# Patient Record
Sex: Female | Born: 1995 | Race: White | Hispanic: No | Marital: Single | Smoking: Never smoker
Health system: Southern US, Community
[De-identification: ages and names within clinical notes are randomized; demographics above are authoritative.]

## PROBLEM LIST (undated history)

## (undated) ENCOUNTER — Inpatient Hospital Stay (HOSPITAL_COMMUNITY): Payer: Self-pay

## (undated) DIAGNOSIS — A749 Chlamydial infection, unspecified: Secondary | ICD-10-CM

## (undated) DIAGNOSIS — R51 Headache: Secondary | ICD-10-CM

## (undated) DIAGNOSIS — F419 Anxiety disorder, unspecified: Secondary | ICD-10-CM

## (undated) DIAGNOSIS — R519 Headache, unspecified: Secondary | ICD-10-CM

## (undated) DIAGNOSIS — F32A Depression, unspecified: Secondary | ICD-10-CM

## (undated) DIAGNOSIS — D681 Hereditary factor XI deficiency: Secondary | ICD-10-CM

## (undated) DIAGNOSIS — F319 Bipolar disorder, unspecified: Secondary | ICD-10-CM

## (undated) DIAGNOSIS — F329 Major depressive disorder, single episode, unspecified: Secondary | ICD-10-CM

## (undated) HISTORY — PX: NO PAST SURGERIES: SHX2092

---

## 2014-09-18 ENCOUNTER — Inpatient Hospital Stay (HOSPITAL_COMMUNITY)
Admission: AD | Admit: 2014-09-18 | Discharge: 2014-09-22 | DRG: 885 | Disposition: A | Discharge status: Home / Self Care | Payer: BLUE CROSS/BLUE SHIELD | Source: Intra-hospital | Attending: Psychiatry | Admitting: Psychiatry

## 2014-09-18 ENCOUNTER — Encounter (HOSPITAL_COMMUNITY): Payer: Self-pay | Admitting: *Deleted

## 2014-09-18 ENCOUNTER — Encounter (HOSPITAL_COMMUNITY): Payer: Self-pay | Admitting: Emergency Medicine

## 2014-09-18 ENCOUNTER — Emergency Department (HOSPITAL_COMMUNITY)
Admission: EM | Admit: 2014-09-18 | Discharge: 2014-09-18 | Disposition: A | Discharge status: Other Acute Inpatient Hospital | Payer: BLUE CROSS/BLUE SHIELD | Attending: Emergency Medicine | Admitting: Emergency Medicine

## 2014-09-18 DIAGNOSIS — Z3202 Encounter for pregnancy test, result negative: Secondary | ICD-10-CM | POA: Diagnosis not present

## 2014-09-18 DIAGNOSIS — G4700 Insomnia, unspecified: Secondary | ICD-10-CM | POA: Diagnosis present

## 2014-09-18 DIAGNOSIS — T1491 Suicide attempt: Secondary | ICD-10-CM | POA: Diagnosis not present

## 2014-09-18 DIAGNOSIS — R443 Hallucinations, unspecified: Secondary | ICD-10-CM | POA: Diagnosis present

## 2014-09-18 DIAGNOSIS — F333 Major depressive disorder, recurrent, severe with psychotic symptoms: Secondary | ICD-10-CM | POA: Diagnosis present

## 2014-09-18 DIAGNOSIS — R45851 Suicidal ideations: Secondary | ICD-10-CM | POA: Diagnosis present

## 2014-09-18 DIAGNOSIS — F419 Anxiety disorder, unspecified: Secondary | ICD-10-CM | POA: Diagnosis present

## 2014-09-18 DIAGNOSIS — F4310 Post-traumatic stress disorder, unspecified: Secondary | ICD-10-CM | POA: Diagnosis present

## 2014-09-18 HISTORY — DX: Major depressive disorder, recurrent, severe with psychotic symptoms: F33.3

## 2014-09-18 LAB — CBC WITH DIFFERENTIAL/PLATELET
Basophils Absolute: 0 10*3/uL (ref 0.0–0.1)
Basophils Relative: 0 % (ref 0–1)
EOS ABS: 0.1 10*3/uL (ref 0.0–0.7)
Eosinophils Relative: 1 % (ref 0–5)
HCT: 38.3 % (ref 36.0–46.0)
Hemoglobin: 13.8 g/dL (ref 12.0–15.0)
LYMPHS ABS: 1.9 10*3/uL (ref 0.7–4.0)
LYMPHS PCT: 22 % (ref 12–46)
MCH: 31.7 pg (ref 26.0–34.0)
MCHC: 36 g/dL (ref 30.0–36.0)
MCV: 88 fL (ref 78.0–100.0)
Monocytes Absolute: 0.4 10*3/uL (ref 0.1–1.0)
Monocytes Relative: 5 % (ref 3–12)
Neutro Abs: 6.2 10*3/uL (ref 1.7–7.7)
Neutrophils Relative %: 72 % (ref 43–77)
PLATELETS: 214 10*3/uL (ref 150–400)
RBC: 4.35 MIL/uL (ref 3.87–5.11)
RDW: 11.7 % (ref 11.5–15.5)
WBC: 8.7 10*3/uL (ref 4.0–10.5)

## 2014-09-18 LAB — ACETAMINOPHEN LEVEL

## 2014-09-18 LAB — COMPREHENSIVE METABOLIC PANEL
ALBUMIN: 4.7 g/dL (ref 3.5–5.0)
ALK PHOS: 66 U/L (ref 38–126)
ALT: 20 U/L (ref 14–54)
AST: 19 U/L (ref 15–41)
Anion gap: 8 (ref 5–15)
BUN: 9 mg/dL (ref 6–20)
CHLORIDE: 108 mmol/L (ref 101–111)
CO2: 23 mmol/L (ref 22–32)
Calcium: 9 mg/dL (ref 8.9–10.3)
Creatinine, Ser: 0.6 mg/dL (ref 0.44–1.00)
GFR calc Af Amer: >60 mL/min (ref >60)
GFR calc non Af Amer: >60 mL/min (ref >60)
Glucose, Bld: 92 mg/dL (ref 65–99)
POTASSIUM: 3.3 mmol/L — AB (ref 3.5–5.1)
Sodium: 139 mmol/L (ref 135–145)
Total Bilirubin: 0.6 mg/dL (ref 0.3–1.2)
Total Protein: 7.8 g/dL (ref 6.5–8.1)

## 2014-09-18 LAB — SALICYLATE LEVEL

## 2014-09-18 LAB — RAPID URINE DRUG SCREEN, HOSP PERFORMED
AMPHETAMINES: NOT DETECTED
Barbiturates: NOT DETECTED
Benzodiazepines: NOT DETECTED
COCAINE: NOT DETECTED
Opiates: NOT DETECTED
TETRAHYDROCANNABINOL: NOT DETECTED

## 2014-09-18 LAB — URINALYSIS, ROUTINE W REFLEX MICROSCOPIC
BILIRUBIN URINE: NEGATIVE
Glucose, UA: NEGATIVE mg/dL
Hgb urine dipstick: NEGATIVE
KETONES UR: NEGATIVE mg/dL
LEUKOCYTES UA: NEGATIVE
Nitrite: NEGATIVE
PROTEIN: NEGATIVE mg/dL
Specific Gravity, Urine: 1.017 (ref 1.005–1.030)
Urobilinogen, UA: 0.2 mg/dL (ref 0.0–1.0)
pH: 6 (ref 5.0–8.0)

## 2014-09-18 LAB — I-STAT BETA HCG BLOOD, ED (MC, WL, AP ONLY)

## 2014-09-18 LAB — ETHANOL: Alcohol, Ethyl (B): 127 mg/dL — ABNORMAL HIGH (ref <5)

## 2014-09-18 MED ORDER — IBUPROFEN 600 MG PO TABS: 600.0000 mg | ORAL_TABLET | Freq: Three times a day (TID) | ORAL | Status: DC | PRN

## 2014-09-18 MED ORDER — ACETAMINOPHEN 325 MG PO TABS: 650.0000 mg | ORAL_TABLET | ORAL | Status: DC | PRN

## 2014-09-18 MED ORDER — ALUM & MAG HYDROXIDE-SIMETH 200-200-20 MG/5ML PO SUSP: 30.0000 mL | ORAL | Status: DC | PRN

## 2014-09-18 MED ORDER — ONDANSETRON HCL 4 MG PO TABS: 4.0000 mg | ORAL_TABLET | Freq: Three times a day (TID) | ORAL | Status: DC | PRN

## 2014-09-18 MED ORDER — IBUPROFEN 200 MG PO TABS: 600.0000 mg | ORAL_TABLET | Freq: Three times a day (TID) | ORAL | Status: DC | PRN

## 2014-09-18 MED ORDER — MAGNESIUM HYDROXIDE 400 MG/5ML PO SUSP: 30.0000 mL | Freq: Every day | ORAL | Status: DC | PRN

## 2014-09-18 MED ORDER — ACETAMINOPHEN 325 MG PO TABS: 650.0000 mg | ORAL_TABLET | Freq: Four times a day (QID) | ORAL | Status: DC | PRN

## 2014-09-18 MED ORDER — LORAZEPAM 1 MG PO TABS: 1.0000 mg | ORAL_TABLET | Freq: Three times a day (TID) | ORAL | Status: DC | PRN

## 2014-09-18 NOTE — Progress Notes (Signed)
Patient accepted to [facility] Bed 501-2 per Randall Hiss, [place]   IVC paperwork to be faxed to TTS

## 2014-09-18 NOTE — ED Notes (Signed)
[employer] and sitter remain at bedside. Pt sitting on bed guarding self.

## 2014-09-18 NOTE — Consult Note (Signed)
[facility] Face-to-Face Psychiatry Consult   Reason for Consult:  Major depressive disorder, recurrent, severe with Psychotic features Referring Physician:  EDP Patient Identification: Alice Castillo MRN:  [id number] Principal Diagnosis: Major depressive disorder, recurrent, severe with psychotic features Diagnosis:   Patient Active Problem List   Diagnosis Date Noted  . Major depressive disorder, recurrent, severe with psychotic features [F33.3] 09/18/2014    Priority: High    Total Time spent with patient: 1 hour  Subjective:   Alice Castillo is a 19 y.o. female patient admitted with  Major depressive disorder, recurrent, severe with Psychotic features  HPI:  Caucasian female was evaluated for suicide attempt by attempting to jump over a bridge.   Patient has one leg across the bridge when [employer] caught her.  Today patient is still endorsing suicide and repeated stated she want to die.  Patient states that her dead girl friend is haunting her and coming to her and telling her she is the reason why she died.  Patient was tearful during the interview, felt hopeless and helpless.  Patient want to go home and complete her suicide attempt.  She has been accepted for admission and she has a bed assigned.  HPI Elements:   Location:  MDD, Recurrent with Psychosis, suicide attempt.. Quality:  severe. Severity:  severe. Timing:  acute. Duration:  Two years. Context:  Brougght in by [employer] from a Bridge where patient was trying to jump off..  Past Medical History: History reviewed. No pertinent past medical history. History reviewed. No pertinent past surgical history. Family History: No family history on file. Social History:  History  Alcohol Use  . Yes     History  Drug Use No    History   Social History  . Marital Status: Single    Spouse Name: N/A  . Number of Children: N/A  . Years of Education: N/A   Social History Main Topics  . Smoking status: Never Smoker   . Smokeless tobacco: Not  on file  . Alcohol Use: Yes  . Drug Use: No  . Sexual Activity: Not on file   Other Topics Concern  . None   Social History Narrative  . None   Additional Social History:    History of alcohol / drug use?: Yes Name of Substance 1: Alcohol  1 - Age of First Use: unknown  1 - Amount (size/oz): 2-3 shots  1 - Frequency: unknown  1 - Duration: ongoing  1 - Last Use / Amount: 09-17-14 BAL-127                   Allergies:   Allergies  Allergen Reactions  . Food     Bananas-throat swells/shortness of breath    Labs:  Results for orders placed or performed during the hospital encounter of 09/18/14 (from the past 48 hour(s))  Urinalysis, Routine w reflex microscopic (not at [facility])     Status: None   Collection Time: 09/18/14  2:32 AM  Result Value Ref Range   Color, Urine YELLOW YELLOW   APPearance CLEAR CLEAR   Specific Gravity, Urine 1.017 1.005 - 1.030   pH 6.0 5.0 - 8.0   Glucose, UA NEGATIVE NEGATIVE mg/dL   Hgb urine dipstick NEGATIVE NEGATIVE   Bilirubin Urine NEGATIVE NEGATIVE   Ketones, ur NEGATIVE NEGATIVE mg/dL   Protein, ur NEGATIVE NEGATIVE mg/dL   Urobilinogen, UA 0.2 0.0 - 1.0 mg/dL   Nitrite NEGATIVE NEGATIVE   Leukocytes, UA NEGATIVE NEGATIVE  Comment: MICROSCOPIC NOT DONE ON URINES WITH NEGATIVE PROTEIN, BLOOD, LEUKOCYTES, NITRITE, OR GLUCOSE <1000 mg/dL.  Urine rapid drug screen (hosp performed)     Status: None   Collection Time: 09/18/14  2:32 AM  Result Value Ref Range   Opiates NONE DETECTED NONE DETECTED   Cocaine NONE DETECTED NONE DETECTED   Benzodiazepines NONE DETECTED NONE DETECTED   Amphetamines NONE DETECTED NONE DETECTED   Tetrahydrocannabinol NONE DETECTED NONE DETECTED   Barbiturates NONE DETECTED NONE DETECTED    Comment:        DRUG SCREEN FOR MEDICAL PURPOSES ONLY.  IF CONFIRMATION IS NEEDED FOR ANY PURPOSE, NOTIFY LAB WITHIN 5 DAYS.        LOWEST DETECTABLE LIMITS FOR URINE DRUG SCREEN Drug Class       Cutoff  (ng/mL) Amphetamine      1000 Barbiturate      200 Benzodiazepine   497 Tricyclics       026 Opiates          300 Cocaine          300 THC              50   Comprehensive metabolic panel     Status: Abnormal   Collection Time: 09/18/14  2:33 AM  Result Value Ref Range   Sodium 139 135 - 145 mmol/L   Potassium 3.3 (L) 3.5 - 5.1 mmol/L   Chloride 108 101 - 111 mmol/L   CO2 23 22 - 32 mmol/L   Glucose, Bld 92 65 - 99 mg/dL   BUN 9 6 - 20 mg/dL   Creatinine, Ser 0.60 0.44 - 1.00 mg/dL   Calcium 9.0 8.9 - 10.3 mg/dL   Total Protein 7.8 6.5 - 8.1 g/dL   Albumin 4.7 3.5 - 5.0 g/dL   AST 19 15 - 41 U/L   ALT 20 14 - 54 U/L   Alkaline Phosphatase 66 38 - 126 U/L   Total Bilirubin 0.6 0.3 - 1.2 mg/dL   GFR calc non Af Amer >60 >60 mL/min   GFR calc Af Amer >60 >60 mL/min    Comment: (NOTE) The eGFR has been calculated using the CKD EPI equation. This calculation has not been validated in all clinical situations. eGFR's persistently <60 mL/min signify possible Chronic Kidney Disease.    Anion gap 8 5 - 15  CBC with Differential/Platelet     Status: None   Collection Time: 09/18/14  2:33 AM  Result Value Ref Range   WBC 8.7 4.0 - 10.5 K/uL   RBC 4.35 3.87 - 5.11 MIL/uL   Hemoglobin 13.8 12.0 - 15.0 g/dL   HCT 38.3 36.0 - 46.0 %   MCV 88.0 78.0 - 100.0 fL   MCH 31.7 26.0 - 34.0 pg   MCHC 36.0 30.0 - 36.0 g/dL   RDW 11.7 11.5 - 15.5 %   Platelets 214 150 - 400 K/uL   Neutrophils Relative % 72 43 - 77 %   Neutro Abs 6.2 1.7 - 7.7 K/uL   Lymphocytes Relative 22 12 - 46 %   Lymphs Abs 1.9 0.7 - 4.0 K/uL   Monocytes Relative 5 3 - 12 %   Monocytes Absolute 0.4 0.1 - 1.0 K/uL   Eosinophils Relative 1 0 - 5 %   Eosinophils Absolute 0.1 0.0 - 0.7 K/uL   Basophils Relative 0 0 - 1 %   Basophils Absolute 0.0 0.0 - 0.1 K/uL  Acetaminophen level     Status:  Abnormal   Collection Time: 09/18/14  2:33 AM  Result Value Ref Range   Acetaminophen (Tylenol), Serum <10 (L) 10 - 30 ug/mL     Comment:        THERAPEUTIC CONCENTRATIONS VARY SIGNIFICANTLY. A RANGE OF 10-30 ug/mL MAY BE AN EFFECTIVE CONCENTRATION FOR MANY PATIENTS. HOWEVER, SOME ARE BEST TREATED AT CONCENTRATIONS OUTSIDE THIS RANGE. ACETAMINOPHEN CONCENTRATIONS >150 ug/mL AT 4 HOURS AFTER INGESTION AND >50 ug/mL AT 12 HOURS AFTER INGESTION ARE OFTEN ASSOCIATED WITH TOXIC REACTIONS.   Ethanol     Status: Abnormal   Collection Time: 09/18/14  2:33 AM  Result Value Ref Range   Alcohol, Ethyl (B) 127 (H) <5 mg/dL    Comment:        LOWEST DETECTABLE LIMIT FOR SERUM ALCOHOL IS 5 mg/dL FOR MEDICAL PURPOSES ONLY   Salicylate level     Status: None   Collection Time: 09/18/14  2:33 AM  Result Value Ref Range   Salicylate Lvl <5.1 2.8 - 30.0 mg/dL  I-Stat Beta hCG blood, ED ([facility], [facility], [facility] only)     Status: None   Collection Time: 09/18/14  2:37 AM  Result Value Ref Range   I-stat hCG, quantitative <5.0 <5 mIU/mL   Comment 3            Comment:   GEST. AGE      CONC.  (mIU/mL)   <=1 WEEK        5 - 50     2 WEEKS       50 - 500     3 WEEKS       100 - 10,000     4 WEEKS     1,000 - 30,000        FEMALE AND NON-PREGNANT FEMALE:     LESS THAN 5 mIU/mL     Vitals: Blood pressure 99/62, pulse 69, temperature 98.8 F (37.1 C), temperature source Oral, resp. rate 18, height 5' 6" (1.676 m), weight 54.432 kg (120 lb), last menstrual period 09/04/2014, SpO2 99 %.  Risk to Self: Suicidal Ideation: Yes-Currently Present Suicidal Intent: Yes-Currently Present Is patient at risk for suicide?: Yes Suicidal Plan?: No Specify Current Suicidal Plan: Pt was found with one leg over an overpass.  Access to Means: Yes Specify Access to Suicidal Means: Pt had access to an overpass. What has been your use of drugs/alcohol within the last 12 months?: Alcohol use reported How many times?: 0 Other Self Harm Risks: No other self harm risk identified at this time.  Triggers for Past Attempts: None known Intentional Self  Injurious Behavior: None Risk to Others: Homicidal Ideation: No Thoughts of Harm to Others: No Current Homicidal Intent: No Current Homicidal Plan: No Access to Homicidal Means: No Describe Access to Homicidal Means: N/A Identified Victim: N/A History of harm to others?: No Assessment of Violence: None Noted Violent Behavior Description: No violent behaviors observed at this time.  Does patient have access to weapons?: No Criminal Charges Pending?: No Does patient have a court date: No Prior Inpatient Therapy: Prior Inpatient Therapy: No Prior Therapy Dates: N/A Prior Therapy Facilty/Provider(s): N/A Reason for Treatment: N/A Prior Outpatient Therapy: Prior Outpatient Therapy: No Prior Therapy Dates: N/A Prior Therapy Facilty/Provider(s): N/A Reason for Treatment: N/A Does patient have an ACCT team?: No Does patient have Intensive In-House Services?  : No Does patient have [facility] services? : No Does patient have P4CC services?: No  Current Facility-Administered Medications  Medication Dose Route Frequency Provider  Last Rate Last Dose  . acetaminophen (TYLENOL) tablet 650 mg  650 mg Oral Q4H PRN Ripley Fraise, MD      . ibuprofen (ADVIL,MOTRIN) tablet 600 mg  600 mg Oral Q8H PRN Ripley Fraise, MD      . LORazepam (ATIVAN) tablet 1 mg  1 mg Oral Q8H PRN Ripley Fraise, MD      . ondansetron [facility]) tablet 4 mg  4 mg Oral Q8H PRN Ripley Fraise, MD       Current Outpatient Prescriptions  Medication Sig Dispense Refill  . POTASSIUM PO Take 1 tablet by mouth every other day as needed (for potassium due to banana allergy.).      Musculoskeletal: Strength & Muscle Tone: within normal limits Gait & Station: normal Patient leans: N/A  Psychiatric Specialty Exam: Physical Exam  Review of Systems  Constitutional: Negative.   HENT: Negative.   Eyes: Negative.   Respiratory: Negative.   Cardiovascular: Negative.   Gastrointestinal: Negative.   Genitourinary: Negative.    Musculoskeletal: Negative.   Skin: Negative.   Neurological: Negative.   Endo/Heme/Allergies: Negative.     Blood pressure 99/62, pulse 69, temperature 98.8 F (37.1 C), temperature source Oral, resp. rate 18, height 5' 6" (1.676 m), weight 54.432 kg (120 lb), last menstrual period 09/04/2014, SpO2 99 %.Body mass index is 19.38 kg/(m^2).  General Appearance: Casual  Eye Contact::  Good  Speech:  Clear and Coherent and Normal Rate  Volume:  Normal  Mood:  Angry, Anxious, Depressed, Hopeless and helpless  Affect:  Congruent, Depressed and Flat  Thought Process:  Coherent, Goal Directed and Intact  Orientation:  Full (Time, Place, and Person)  Thought Content:  WDL  Suicidal Thoughts:  Yes.  with intent/plan  Homicidal Thoughts:  No  Memory:  Immediate;   Good Recent;   Good Remote;   Good  Judgement:  Poor  Insight:  Shallow  Psychomotor Activity:  Psychomotor Retardation  Concentration:  Good  Recall:  NA  Fund of Knowledge:Fair  Language: Good  Akathisia:  NA  Handed:  Right  AIMS (if indicated):     Assets:  Desire for Improvement  ADL's:  Intact  Cognition: WNL  Sleep:      Medical Decision Making: Review of Psycho-Social Stressors (1)  Treatment Plan Summary: Daily contact with patient to assess and evaluate symptoms and progress in treatment and Medication management  Plan:  Recommend psychiatric Inpatient admission when medically cleared. resume home medications.   Disposition: Admitted to [facility] and has a bed assigned.  Delfin Gant   PMHNP-BC 09/18/2014 4:00 PM  Patient seen face-to-face for psychiatric evaluation along with psychiatric nurse practitioner and case discussed with the treatment team. Presented with symptoms of severe depression, hopelessness, helplessness with suicidal ideation, intention or plan. Patient plan was stopped by [facility]. Formulated treatment plan and reviewed the information documented and agree with the  treatment plan.  ,JANARDHAHA R. 09/19/2014 7:23 PM

## 2014-09-18 NOTE — ED Provider Notes (Signed)
CSN: [id number]     Arrival date & time 09/18/14  0206 History   This chart was scribed for Ripley Fraise, MD by Forrestine Him, ED Scribe. This patient was seen in room WA13/WA13 and the patient's care was started 2:43 AM.   Chief Complaint  Patient presents with  . Hallucinations   The history is provided by the patient. No language interpreter was used.    HPI Comments: Dalyla Chui is a 19 y.o. female without any pertinent past medical history who presents to the Emergency Department here for auditory and visual hallucinations this morning. Pt was transported from overpass prior to arrival by [employer]. Per note, [employer] states pt had one leg over the edge when [employer] removed her. Pt states her friend who passed away when she was 85 years old has been haunting her. States she returned this evening and told her either to kill herself or she was going to kill her. Pt states she was pulled out of bed by her dead friend 2 nights ago. States she does not want to pass out or go to sleep in fear of seeing her friend again. Ms. Tullo denies any illicit drug use this evening. However, she admits to alcohol consumption today. She denies any pain at this time. No chest pain, back pain, neck pain, or abdominal pain. No nausea, vomiting, or diarrhea. No known allergies to medications.  PMH - none  History  Substance Use Topics  . Smoking status: Never Smoker   . Smokeless tobacco: Not on file  . Alcohol Use: Yes   OB History    No data available     Review of Systems  Constitutional: Negative for fever and chills.  Respiratory: Negative for shortness of breath.   Cardiovascular: Negative for chest pain.  Gastrointestinal: Negative for nausea, vomiting and abdominal pain.  Musculoskeletal: Negative for back pain and neck pain.  Psychiatric/Behavioral: Positive for suicidal ideas and hallucinations.  All other systems reviewed and are negative.     Allergies  Review of patient's allergies indicates  no known allergies.  Home Medications   Prior to Admission medications   Not on File   Triage Vitals: BP 133/83 mmHg  Pulse 113  Temp(Src) 98.6 F (37 C) (Oral)  Resp 16  Ht 5\' 6"  (1.676 m)  Wt 120 lb (54.432 kg)  BMI 19.38 kg/m2  SpO2 98%  LMP 09/04/2014   Physical Exam  CONSTITUTIONAL: Well developed/well nourished. Tearful and anxious  HEAD: Normocephalic/atraumatic EYES: EOMI/PERRL ENMT: Mucous membranes moist NECK: supple no meningeal signs SPINE/BACK:entire spine nontender CV: S1/S2 noted, no murmurs/rubs/gallops noted LUNGS: Lungs are clear to auscultation bilaterally, no apparent distress ABDOMEN: soft, nontender GU:no cva tenderness NEURO: Pt is awake/alert/appropriate, moves all extremitiesx4.  No facial droop.   EXTREMITIES: pulses normal/equal, full ROM SKIN: warm, color normal PSYCH: Anxious and tearful    ED Course  Procedures   DIAGNOSTIC STUDIES: Oxygen Saturation is 98% on RA, Normal by my interpretation.    COORDINATION OF CARE: 2:52 AM- Will order CMP, CBC, urinalysis, Acetaminophen level, urine rapid drug screen, salicylate level, and i-stat bata hcg blood. Discussed treatment plan with pt at bedside and pt agreed to plan.      Suspect new onset psychosis Pt is medically stable Labs reassuring IVC completed  Labs Review Labs Reviewed  COMPREHENSIVE METABOLIC PANEL - Abnormal; Notable for the following:    Potassium 3.3 (*)    All other components within normal limits  ACETAMINOPHEN LEVEL - Abnormal; Notable  for the following:    Acetaminophen (Tylenol), Serum <10 (*)    All other components within normal limits  ETHANOL - Abnormal; Notable for the following:    Alcohol, Ethyl (B) 127 (*)    All other components within normal limits  CBC WITH DIFFERENTIAL/PLATELET  URINALYSIS, ROUTINE W REFLEX MICROSCOPIC (NOT AT [facility])  URINE RAPID DRUG SCREEN, HOSP PERFORMED  SALICYLATE LEVEL  I-STAT BETA HCG BLOOD, ED ([facility], [facility], [facility] ONLY)     MDM    Final diagnoses:  Hallucinations    Nursing notes including past medical history and social history reviewed and considered in documentation Labs/vital reviewed myself and considered during evaluation    I personally performed the services described in this documentation, which was scribed in my presence. The recorded information has been reviewed and is accurate.      Ripley Fraise, MD 09/18/14 [phone]

## 2014-09-18 NOTE — ED Notes (Signed)
Pt has been wanded by security.

## 2014-09-18 NOTE — Progress Notes (Addendum)
Pt stated."I just want to go home.I do not need to be here." Pt appears flat, blunted and depressed. She does contract for safety.Pt remains a 1:1 and remains safe. Her affect is very apatheitc and flat. She speaks in a low soft voice. Pt stated,"just let me go home already."Pt stated her good friend was cutting three years ago and the pt threw all the sharps away so her friend could not cut anymore. Pt stated she felt her friend was okay so she went home. That night the friend hung herself. Pt stated she is always haunted at night by this friend who blames her for dying. Pt is very flat and blunted. She only ate grapes and cucumbers for lunch.3p-Pts mom came to visit. 4p-Pts mom is very upset statiing her daughter blames her for the SI attempt. Mom was very tearful speaking to the Probation officer. Pts brother is at the bedside. Pt stated,"You can send me across the street but I am not going to groups and not talking to anybody." "It is a waste of time just let me go home."5p-Phoned to give report for pt going to room 501-1-waiting for the nurse to phone back. Pt stated her mom kicked her out 2 years ago and then accused her of sleeping around. Pt stated,"my mom has no clue that I was helping my friend that killed herself and was not sleeping around."Pt received a phone call from a friend and presently she is talking on the phone. Pt admitted that working a [employer] and exercising still is not enough to get rig of her haunting friend's voice. 6;15pReport given to [facility] in [facility]. MOM's phone number [phone])

## 2014-09-18 NOTE — BH Assessment (Signed)
Assessment completed. Consulted Arlester Marker, NP who agrees that pt meets inpatient criteria. Dr. Christy Gentles has been informed of the recommendation. TTS will contact other facilities for placement.

## 2014-09-18 NOTE — ED Notes (Signed)
Bed: VH29 Expected date:  Expected time:  Means of arrival:  Comments: 69

## 2014-09-18 NOTE — BH Assessment (Addendum)
Tele Assessment Note   Alice Castillo is an 19 y.o. female Presenting to [facility] after a suicide attempted. Pt stated "I attempted suicide which you already know you just want me to tell you anyway". "I don't talk to therapist". Pt did not report any previous suicide attempts or psychiatric hospitalizations; however IVC paperwork reports that pt has attempted suicide in the past by slitting her wrist. Pt stated "I am not crazy multiple times during the assessment. When pt was asked about self-injurious behaviors pt stated "I don't want to talk about it". Pt is endorsing multiple depressive symptoms and shared that her sleep and appetite have been poor. Pt reported having visual hallucinations and stated "I see her". "My dead friend". Pt did not report any pending criminal charges or upcoming court dates. Pt denied having access to weapons or firearms. Pt reported that she drinks alcohol and her BAL is 127. Pt did not report any illicit substance use. When asked about past and present abuse history pt stated "I don't want to answer". Pt did not identify any supportive family members at this time and stated "no one supports me". Inpatient treatment is recommended for psychiatric stabilization.   Axis I: Major Depressive Disorder, Single episode, with psychotic features   Past Medical History: History reviewed. No pertinent past medical history.  History reviewed. No pertinent past surgical history.  Family History: No family history on file.  Social History:  reports that she has never smoked. She does not have any smokeless tobacco history on file. She reports that she drinks alcohol. She reports that she does not use illicit drugs.  Additional Social History:  Alcohol / Drug Use History of alcohol / drug use?: Yes Substance #1 Name of Substance 1: Alcohol  1 - Age of First Use: unknown  1 - Amount (size/oz): 2-3 shots  1 - Frequency: unknown  1 - Duration: ongoing  1 - Last Use / Amount: 09-17-14  BAL-127  CIWA: CIWA-Ar BP: 133/83 mmHg Pulse Rate: 113 COWS:    PATIENT STRENGTHS: (choose at least two) Average or above average intelligence Physical Health  Allergies: No Known Allergies  Home Medications:  (Not in a hospital admission)  OB/GYN Status:  Patient's last menstrual period was 09/04/2014.  General Assessment Data Location of Assessment: [facility] ED TTS Assessment: In system Is this a Tele or Face-to-Face Assessment?: Face-to-Face Is this an Initial Assessment or a Re-assessment for this encounter?: Initial Assessment Can pt return to current living arrangement?: Yes Admission Status: Involuntary Is patient capable of signing voluntary admission?: Yes Referral Source: Other ([employer]) Insurance type: None      Crisis Care Plan Name of Psychiatrist: No provider reported at this time Name of Therapist: No provider reported at this time.   Education Status Is patient currently in school?: No Current Grade: N/A Highest grade of school patient has completed: N/A Name of school: N/A Contact person: N/A  Risk to self with the past 6 months Suicidal Ideation: Yes-Currently Present Has patient been a risk to self within the past 6 months prior to admission? : No Suicidal Intent: Yes-Currently Present Has patient had any suicidal intent within the past 6 months prior to admission? : No Is patient at risk for suicide?: Yes Suicidal Plan?: No Has patient had any suicidal plan within the past 6 months prior to admission? : Yes Specify Current Suicidal Plan: Pt was found with one leg over an overpass.  Access to Means: Yes Specify Access to Suicidal Means: Pt had  access to an overpass. What has been your use of drugs/alcohol within the last 12 months?: Alcohol use reported Previous Attempts/Gestures: No How many times?: 0 Other Self Harm Risks: No other self harm risk identified at this time.  Triggers for Past Attempts: None known Intentional Self Injurious Behavior:  None Family Suicide History: Unknown Recent stressful life event(s):  (Pt did not provide a response. ) Persecutory voices/beliefs?: Yes Depression: Yes Depression Symptoms: Despondent, Insomnia, Tearfulness, Isolating, Feeling worthless/self pity, Feeling angry/irritable Substance abuse history and/or treatment for substance abuse?: Yes Suicide prevention information given to non-admitted patients: Not applicable  Risk to Others within the past 6 months Homicidal Ideation: No Does patient have any lifetime risk of violence toward others beyond the six months prior to admission? : No Thoughts of Harm to Others: No Current Homicidal Intent: No Current Homicidal Plan: No Access to Homicidal Means: No Describe Access to Homicidal Means: N/A Identified Victim: N/A History of harm to others?: No Assessment of Violence: None Noted Violent Behavior Description: No violent behaviors observed at this time.  Does patient have access to weapons?: No Criminal Charges Pending?: No Does patient have a court date: No Is patient on probation?: Unknown  Psychosis Hallucinations: Visual ("Her". "My dead friend". ) Delusions: None noted  Mental Status Report Appearance/Hygiene: In scrubs Eye Contact: Poor Motor Activity: Freedom of movement Speech: Logical/coherent Level of Consciousness: Irritable Mood: Irritable Affect: Irritable Anxiety Level: Moderate Thought Processes: Coherent, Relevant Judgement: Partial (BAL-127) Orientation: Appropriate for developmental age Obsessive Compulsive Thoughts/Behaviors: Minimal  Cognitive Functioning Concentration: Fair Memory: Recent Intact IQ: Average Insight: Poor Impulse Control: Poor Appetite: Poor Weight Loss: 0 Weight Gain: 0 Sleep: Decreased Total Hours of Sleep:  ("I don't know") Vegetative Symptoms: None  ADLScreening [facility] Assessment Services) Patient's cognitive ability adequate to safely complete daily activities?: Yes Patient  able to express need for assistance with ADLs?: Yes Independently performs ADLs?: Yes (appropriate for developmental age)  Prior Inpatient Therapy Prior Inpatient Therapy: No Prior Therapy Dates: N/A Prior Therapy Facilty/Provider(s): N/A Reason for Treatment: N/A  Prior Outpatient Therapy Prior Outpatient Therapy: No Prior Therapy Dates: N/A Prior Therapy Facilty/Provider(s): N/A Reason for Treatment: N/A Does patient have an ACCT team?: No Does patient have Intensive In-House Services?  : No Does patient have [facility] services? : No Does patient have P4CC services?: No  ADL Screening (condition at time of admission) Patient's cognitive ability adequate to safely complete daily activities?: Yes Patient able to express need for assistance with ADLs?: Yes Independently performs ADLs?: Yes (appropriate for developmental age)       Abuse/Neglect Assessment (Assessment to be complete while patient is alone) Physical Abuse:  ("I don't want to answer") Verbal Abuse:  ("I don't want to answer") Sexual Abuse:  ("I don't want to answer") Exploitation of patient/patient's resources:  (Pt did not provide a response. ) Self-Neglect:  (Pt did not provide a response. )     Advance Directives (For Healthcare) Does patient have an advance directive?: No Would patient like information on creating an advanced directive?: No - patient declined information    Additional Information 1:1 In Past 12 Months?: Yes CIRT Risk: No Elopement Risk: No Does patient have medical clearance?: Yes     Disposition:  Disposition Initial Assessment Completed for this Encounter: Yes Disposition of Patient: Inpatient treatment program Type of inpatient treatment program: Adult  Avondre Richens S 09/18/2014 3:36 AM
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
## 2014-09-19 ENCOUNTER — Encounter (HOSPITAL_COMMUNITY): Payer: Self-pay | Admitting: Registered Nurse

## 2014-09-19 DIAGNOSIS — F333 Major depressive disorder, recurrent, severe with psychotic symptoms: Principal | ICD-10-CM

## 2014-09-19 DIAGNOSIS — T1491 Suicide attempt: Secondary | ICD-10-CM

## 2014-09-19 DIAGNOSIS — R45851 Suicidal ideations: Secondary | ICD-10-CM

## 2014-09-19 MED ORDER — DIPHENHYDRAMINE HCL 25 MG PO CAPS: 25.0000 mg | ORAL_CAPSULE | Freq: Four times a day (QID) | ORAL | Status: DC | PRN

## 2014-09-19 MED ORDER — TRAZODONE HCL 50 MG PO TABS: 50.0000 mg | ORAL_TABLET | Freq: Every day | ORAL | Status: DC

## 2014-09-19 MED ORDER — TRAZODONE HCL 50 MG PO TABS
50.0000 mg | ORAL_TABLET | Freq: Every day | ORAL | Status: DC
  Filled 2014-09-19 (×4): qty 1
  Filled 2014-09-19: qty 3

## 2014-09-19 MED ORDER — FLUOXETINE HCL 10 MG PO CAPS
10.0000 mg | ORAL_CAPSULE | Freq: Every day | ORAL | Status: DC
  Administered 2014-09-20 – 2014-09-22 (×3): 10 mg via ORAL
  Filled 2014-09-19 (×5): qty 1
  Filled 2014-09-19: qty 3

## 2014-09-19 MED ORDER — HYDROCORTISONE 1 % EX CREA
TOPICAL_CREAM | Freq: Four times a day (QID) | CUTANEOUS | Status: DC | PRN
  Filled 2014-09-19: qty 28

## 2014-09-19 NOTE — Progress Notes (Signed)
Patient ID: Alice Castillo, female   DOB: 1995-11-03, 19 y.o.   MRN: [id number]   D: Pt has been very flat and depressed on the unit today. Pt has also been very isolative. Pt did not attend any groups, nor did she engage in any treatment. Pt reported that she was not going to do anything while here at [facility], and that all she wants is to go home. Pt was seen by the doctor today and started on Prozac, pt refused medication and reported that she was not going to take anything that was going to cause her to be a zombe. Pt reported being negative SI/HI, no AH/VH noted. A: 15 min checks continued for patient safety. R: Pt safety maintained.
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
## 2014-09-19 NOTE — [facility] Suicide Risk Assessment (Signed)
[facility] Admission Suicide Risk Assessment   Nursing information obtained from:  Patient Demographic factors:  Caucasian, Unemployed Current Mental Status:  Suicidal ideation indicated by patient Loss Factors:  Loss of significant relationship, Financial problems / change in socioeconomic status Historical Factors:  NA Risk Reduction Factors:  Living with another person, especially a relative Total Time spent with patient: 30 minutes Principal Problem: Major depressive disorder, recurrent episode, severe, with psychosis Diagnosis:   Patient Active Problem List   Diagnosis Date Noted  . Major depressive disorder, recurrent episode, severe, with psychosis [F33.3] 09/18/2014    Priority: High  . Major depressive disorder, recurrent, severe with psychotic features [F33.3] 09/18/2014  . Hallucinations [R44.3]   . Severe recurrent major depressive disorder with psychotic features [F33.3]      Continued Clinical Symptoms:  Alcohol Use Disorder Identification Test Final Score (AUDIT): 1 The "Alcohol Use Disorders Identification Test", Guidelines for Use in Primary Care, Second Edition.  World Pharmacologist [facility]). Score between 0-7:  no or low risk or alcohol related problems. Score between 8-15:  moderate risk of alcohol related problems. Score between 16-19:  high risk of alcohol related problems. Score 20 or above:  warrants further diagnostic evaluation for alcohol dependence and treatment.   CLINICAL FACTORS:   Depression:   Comorbid alcohol abuse/dependence Hopelessness Impulsivity Insomnia Severe Alcohol/Substance Abuse/Dependencies   Musculoskeletal: Strength & Muscle Tone: within normal limits Gait & Station: normal Patient leans: N/A  Psychiatric Specialty Exam: Physical Exam  Psychiatric: Her mood appears anxious. Her speech is delayed. She is slowed, withdrawn and actively hallucinating. Cognition and memory are normal. She expresses impulsivity. She expresses  suicidal ideation. She expresses suicidal plans.    Review of Systems  Constitutional: Negative.   HENT: Negative.   Eyes: Negative.   Respiratory: Negative.   Cardiovascular: Negative.   Gastrointestinal: Positive for nausea.  Genitourinary: Negative.   Musculoskeletal: Negative.   Skin: Negative.   Neurological: Negative.   Endo/Heme/Allergies: Negative.   Psychiatric/Behavioral: Positive for depression, suicidal ideas and substance abuse. The patient is nervous/anxious and has insomnia.     Blood pressure 115/78, pulse 93, temperature 98.6 F (37 C), temperature source Oral, resp. rate 18, height 5\' 6"  (1.676 m), weight 54.432 kg (120 lb), last menstrual period 09/04/2014.Body mass index is 19.38 kg/(m^2).  General Appearance: Casual  Eye Contact::  Minimal  Speech:  Clear and Coherent  Volume:  Decreased  Mood:  Depressed, Dysphoric and Hopeless  Affect:  Constricted  Thought Process:  Goal Directed and Linear  Orientation:  Full (Time, Place, and Person)  Thought Content:  Hallucinations: Auditory  Suicidal Thoughts:  Yes.  with intent/plan  Homicidal Thoughts:  No  Memory:  Immediate;   Good Recent;   Good Remote;   Good  Judgement:  Impaired  Insight:  Lacking  Psychomotor Activity:  Decreased  Concentration:  Fair  Recall:  Good  Fund of Knowledge:Good  Language: Good  Akathisia:  No  Handed:  Right  AIMS (if indicated):     Assets:  Communication Skills Desire for Improvement Physical Health  Sleep:  Number of Hours: 4.75  Cognition: WNL  ADL's:  Intact     COGNITIVE FEATURES THAT CONTRIBUTE TO RISK:  Closed-mindedness and Polarized thinking    SUICIDE RISK:   Moderate:  Frequent suicidal ideation with limited intensity, and duration, some specificity in terms of plans, no associated intent, good self-control, limited dysphoria/symptomatology, some risk factors present, and identifiable protective factors, including available and accessible social  support.  PLAN OF CARE: 1. Admit for crisis management and stabilization. 2. Medication management to reduce current symptoms to base line and improve the patient's overall level of functioning 3. Treat health problems as indicated. 4. Develop treatment plan to decrease risk of relapse upon discharge and the need for readmission. 5. Psycho-social education regarding relapse prevention and self care. 6. Health care follow up as needed for medical problems. 7. Restart home medications where appropriate.   Medical Decision Making:  Review or order clinical lab tests (1), Established Problem, Worsening (2), Review of Medication Regimen & Side Effects (2) and Review of New Medication or Change in Dosage (2)  I certify that inpatient services furnished can reasonably be expected to improve the patient's condition.   Corena Pilgrim, MD 09/19/2014, 11:32 AM

## 2014-09-19 NOTE — Progress Notes (Addendum)
Patient ID: Alice Castillo, female   DOB: 02/06/1996, 19 y.o.   MRN: [id number] Client is 19 yo female admitted involuntarily. Client reports "I tried to commit suicide" Client currently contracting for safety. "just went to a bridge thought it would be best" Client reports"I have anxiety and panic attacks it hurts where my heart is" Client reports a friend committed suicide about year ago and haunts her. "she tells me it's my fault" "I see things sometimes" Client reports to help relieve stress "gymn helps" "work helps, but I quit my job thinking another was going to call me and they didn't so I don't have that anymore" Client lives with a friend, reports poor support system from family "they don't care" Client irritable upon approach but later becomes tearful. She is also a little resistant "I don't trust nobody, can't tell people things they tell everyone else" Client has no significant medical history. Client oriented to unit/room, given snacks and drink. Client encouraged to participate in group therapy and share concerns with physician/staff. Client is safe on the unit.

## 2014-09-19 NOTE — Progress Notes (Signed)
Adult Psychoeducational Group Note  Date:  09/19/2014 Time:  9:35 PM  Group Topic/Focus:  Wrap-Up Group:   The focus of this group is to help patients review their daily goal of treatment and discuss progress on daily workbooks.  Participation Level:  Active  Participation Quality:  Appropriate  Affect:  Appropriate  Cognitive:  Appropriate  Insight: Appropriate  Engagement in Group:  Engaged  Modes of Intervention:  Discussion  Additional Comments:The patient attended group.  [name] 09/19/2014, 9:35 PM

## 2014-09-19 NOTE — H&P (Signed)
Psychiatric Admission Assessment Adult  Patient Identification: Alice Castillo MRN:  [id number] Date of Evaluation:  09/19/2014 Chief Complaint:  MDD WITH PSYCHOTIC FEATURES Principal Diagnosis: Major depressive disorder, recurrent episode, severe, with psychosis Diagnosis:   Patient Active Problem List   Diagnosis Date Noted  . Major depressive disorder, recurrent, severe with psychotic features [F33.3] 09/18/2014  . Major depressive disorder, recurrent episode, severe, with psychosis [F33.3] 09/18/2014  . Hallucinations [R44.3]   . Severe recurrent major depressive disorder with psychotic features [F33.3]    History of Present Illness:: Patient states that she is in the hospital "cause I tried to jump off the bridge.  I done answered these questions before; you already know the answers." Patient appears angry and irritated.  Patient denies history of prior suicide attempt. Unable to get patient to cooperated during the interview.  Patient is sitting with arm crossed over her chest. Unable to determine is responding to internal/external stimuli.  Patient does complain of itching on legs bilaterally multiple insect bites.    Note prior to admission Dr. Louretta Shorten:  Caucasian female was evaluated for suicide attempt by attempting to jump over a bridge. Patient has one leg across the bridge when [employer] caught her. Today patient is still endorsing suicide and repeated stated she want to die. Patient states that her dead girl friend is haunting her and coming to her and telling her she is the reason why she died. Patient was tearful during the interview, felt hopeless and helpless. Patient want to go home and complete her suicide attempt. She has been accepted for admission and she has a bed assigned.  Elements:  Location:  Suicide attempt. Quality:  Depression. Severity:  Severe. Duration:  1 day. Associated Signs/Symptoms: Depression Symptoms:  depressed mood, feelings of  worthlessness/guilt, hopelessness, suicidal thoughts with specific plan, anxiety, (Hypo) Manic Symptoms:  Impulsivity, Irritable Mood, Anxiety Symptoms:  Excessive Worry, Panic Symptoms, Psychotic Symptoms:  Hallucinations: Auditory PTSD Symptoms: Unable to determine at this time. Patient would not answer question Total Time spent with patient: 45 minutes  Past Medical History: History reviewed. No pertinent past medical history. History reviewed. No pertinent past surgical history. Family History: History reviewed. No pertinent family history. Social History:  History  Alcohol Use  . 0.6 oz/week  . 1 Shots of liquor per week     History  Drug Use No    History   Social History  . Marital Status: Single    Spouse Name: N/A  . Number of Children: N/A  . Years of Education: N/A   Social History Main Topics  . Smoking status: Never Smoker   . Smokeless tobacco: Not on file  . Alcohol Use: 0.6 oz/week    1 Shots of liquor per week  . Drug Use: No  . Sexual Activity: Yes    Birth Control/ Protection: Condom   Other Topics Concern  . None   Social History Narrative   Additional Social History:   Musculoskeletal: Strength & Muscle Tone: within normal limits Gait & Station: normal Patient leans: N/A  Psychiatric Specialty Exam: Physical Exam  Constitutional: She is oriented to person, place, and time.  Neck: Normal range of motion.  Respiratory: Effort normal.  Musculoskeletal: Normal range of motion.  Neurological: She is alert and oriented to person, place, and time.  Psychiatric: Her mood appears anxious. Her speech is delayed. She is slowed, withdrawn and actively hallucinating. Cognition and memory are normal. She expresses impulsivity. She exhibits a depressed mood.  Review of Systems  Gastrointestinal: Positive for nausea.  Psychiatric/Behavioral: Positive for depression and suicidal ideas. The patient is nervous/anxious and has insomnia.   All  other systems reviewed and are negative.    Blood pressure 115/78, pulse 93, temperature 98.6 F (37 C), temperature source Oral, resp. rate 18, height 5' 6"  (1.676 m), weight 54.432 kg (120 lb), last menstrual period 09/04/2014.Body mass index is 19.38 kg/(m^2).  General Appearance: Casual  Eye Contact::  Minimal  Speech:  Clear and Coherent  Volume:  Decreased  Mood:  Dysphoric and Hopeless  Affect:  Constricted  Thought Process:  Linear  Orientation:  Full (Time, Place, and Person)  Thought Content:  Hallucinations: Auditory  Suicidal Thoughts:  Yes.  with intent/plan  Homicidal Thoughts:  No  Memory:  Immediate;   Good Recent;   Good Remote;   Good  Judgement:  Impaired  Insight:  Lacking  Psychomotor Activity:  Decreased  Concentration:  Fair  Recall:  Good  Fund of Knowledge:Good  Language: Good  Akathisia:  No  Handed:  Right  AIMS (if indicated):     Assets:  Communication Skills Desire for Improvement  ADL's:  Intact  Cognition: WNL  Sleep:  Number of Hours: 4.75   Risk to Self: Is patient at risk for suicide?: Yes Risk to Others:   Prior Inpatient Therapy:   Prior Outpatient Therapy:    Alcohol Screening: Patient refused Alcohol Screening Tool: Yes 1. How often do you have a drink containing alcohol?: Monthly or less 2. How many drinks containing alcohol do you have on a typical day when you are drinking?: 1 or 2 3. How often do you have six or more drinks on one occasion?: Never Preliminary Score: 0 9. Have you or someone else been injured as a result of your drinking?: No 10. Has a relative or friend or a doctor or another health worker been concerned about your drinking or suggested you cut down?: No Alcohol Use Disorder Identification Test Final Score (AUDIT): 1 Brief Intervention: Patient declined brief intervention  Allergies:   Allergies  Allergen Reactions  . Food     Bananas-throat swells/shortness of breath   Lab Results:  Results for orders  placed or performed during the hospital encounter of 09/18/14 (from the past 48 hour(s))  Urinalysis, Routine w reflex microscopic (not at [facility])     Status: None   Collection Time: 09/18/14  2:32 AM  Result Value Ref Range   Color, Urine YELLOW YELLOW   APPearance CLEAR CLEAR   Specific Gravity, Urine 1.017 1.005 - 1.030   pH 6.0 5.0 - 8.0   Glucose, UA NEGATIVE NEGATIVE mg/dL   Hgb urine dipstick NEGATIVE NEGATIVE   Bilirubin Urine NEGATIVE NEGATIVE   Ketones, ur NEGATIVE NEGATIVE mg/dL   Protein, ur NEGATIVE NEGATIVE mg/dL   Urobilinogen, UA 0.2 0.0 - 1.0 mg/dL   Nitrite NEGATIVE NEGATIVE   Leukocytes, UA NEGATIVE NEGATIVE    Comment: MICROSCOPIC NOT DONE ON URINES WITH NEGATIVE PROTEIN, BLOOD, LEUKOCYTES, NITRITE, OR GLUCOSE <1000 mg/dL.  Urine rapid drug screen (hosp performed)     Status: None   Collection Time: 09/18/14  2:32 AM  Result Value Ref Range   Opiates NONE DETECTED NONE DETECTED   Cocaine NONE DETECTED NONE DETECTED   Benzodiazepines NONE DETECTED NONE DETECTED   Amphetamines NONE DETECTED NONE DETECTED   Tetrahydrocannabinol NONE DETECTED NONE DETECTED   Barbiturates NONE DETECTED NONE DETECTED    Comment:  DRUG SCREEN FOR MEDICAL PURPOSES ONLY.  IF CONFIRMATION IS NEEDED FOR ANY PURPOSE, NOTIFY LAB WITHIN 5 DAYS.        LOWEST DETECTABLE LIMITS FOR URINE DRUG SCREEN Drug Class       Cutoff (ng/mL) Amphetamine      1000 Barbiturate      200 Benzodiazepine   235 Tricyclics       361 Opiates          300 Cocaine          300 THC              50   Comprehensive metabolic panel     Status: Abnormal   Collection Time: 09/18/14  2:33 AM  Result Value Ref Range   Sodium 139 135 - 145 mmol/L   Potassium 3.3 (L) 3.5 - 5.1 mmol/L   Chloride 108 101 - 111 mmol/L   CO2 23 22 - 32 mmol/L   Glucose, Bld 92 65 - 99 mg/dL   BUN 9 6 - 20 mg/dL   Creatinine, Ser 0.60 0.44 - 1.00 mg/dL   Calcium 9.0 8.9 - 10.3 mg/dL   Total Protein 7.8 6.5 - 8.1 g/dL    Albumin 4.7 3.5 - 5.0 g/dL   AST 19 15 - 41 U/L   ALT 20 14 - 54 U/L   Alkaline Phosphatase 66 38 - 126 U/L   Total Bilirubin 0.6 0.3 - 1.2 mg/dL   GFR calc non Af Amer >60 >60 mL/min   GFR calc Af Amer >60 >60 mL/min    Comment: (NOTE) The eGFR has been calculated using the CKD EPI equation. This calculation has not been validated in all clinical situations. eGFR's persistently <60 mL/min signify possible Chronic Kidney Disease.    Anion gap 8 5 - 15  CBC with Differential/Platelet     Status: None   Collection Time: 09/18/14  2:33 AM  Result Value Ref Range   WBC 8.7 4.0 - 10.5 K/uL   RBC 4.35 3.87 - 5.11 MIL/uL   Hemoglobin 13.8 12.0 - 15.0 g/dL   HCT 38.3 36.0 - 46.0 %   MCV 88.0 78.0 - 100.0 fL   MCH 31.7 26.0 - 34.0 pg   MCHC 36.0 30.0 - 36.0 g/dL   RDW 11.7 11.5 - 15.5 %   Platelets 214 150 - 400 K/uL   Neutrophils Relative % 72 43 - 77 %   Neutro Abs 6.2 1.7 - 7.7 K/uL   Lymphocytes Relative 22 12 - 46 %   Lymphs Abs 1.9 0.7 - 4.0 K/uL   Monocytes Relative 5 3 - 12 %   Monocytes Absolute 0.4 0.1 - 1.0 K/uL   Eosinophils Relative 1 0 - 5 %   Eosinophils Absolute 0.1 0.0 - 0.7 K/uL   Basophils Relative 0 0 - 1 %   Basophils Absolute 0.0 0.0 - 0.1 K/uL  Acetaminophen level     Status: Abnormal   Collection Time: 09/18/14  2:33 AM  Result Value Ref Range   Acetaminophen (Tylenol), Serum <10 (L) 10 - 30 ug/mL    Comment:        THERAPEUTIC CONCENTRATIONS VARY SIGNIFICANTLY. A RANGE OF 10-30 ug/mL MAY BE AN EFFECTIVE CONCENTRATION FOR MANY PATIENTS. HOWEVER, SOME ARE BEST TREATED AT CONCENTRATIONS OUTSIDE THIS RANGE. ACETAMINOPHEN CONCENTRATIONS >150 ug/mL AT 4 HOURS AFTER INGESTION AND >50 ug/mL AT 12 HOURS AFTER INGESTION ARE OFTEN ASSOCIATED WITH TOXIC REACTIONS.   Ethanol     Status: Abnormal  Collection Time: 09/18/14  2:33 AM  Result Value Ref Range   Alcohol, Ethyl (B) 127 (H) <5 mg/dL    Comment:        LOWEST DETECTABLE LIMIT FOR SERUM ALCOHOL  IS 5 mg/dL FOR MEDICAL PURPOSES ONLY   Salicylate level     Status: None   Collection Time: 09/18/14  2:33 AM  Result Value Ref Range   Salicylate Lvl <5.6 2.8 - 30.0 mg/dL  I-Stat Beta hCG blood, ED ([facility], [facility], [facility] only)     Status: None   Collection Time: 09/18/14  2:37 AM  Result Value Ref Range   I-stat hCG, quantitative <5.0 <5 mIU/mL   Comment 3            Comment:   GEST. AGE      CONC.  (mIU/mL)   <=1 WEEK        5 - 50     2 WEEKS       50 - 500     3 WEEKS       100 - 10,000     4 WEEKS     1,000 - 30,000        FEMALE AND NON-PREGNANT FEMALE:     LESS THAN 5 mIU/mL    Current Medications: Current Facility-Administered Medications  Medication Dose Route Frequency Provider Last Rate Last Dose  . acetaminophen (TYLENOL) tablet 650 mg  650 mg Oral Q6H PRN Delfin Gant, NP      . alum & mag hydroxide-simeth (MAALOX/MYLANTA) 200-200-20 MG/5ML suspension 30 mL  30 mL Oral Q4H PRN Delfin Gant, NP      . FLUoxetine (PROZAC) capsule 10 mg  10 mg Oral Daily Jaydalee Bardwell   10 mg at 09/19/14 1145  . ibuprofen (ADVIL,MOTRIN) tablet 600 mg  600 mg Oral Q8H PRN Delfin Gant, NP      . LORazepam (ATIVAN) tablet 1 mg  1 mg Oral Q8H PRN Delfin Gant, NP      . magnesium hydroxide (MILK OF MAGNESIA) suspension 30 mL  30 mL Oral Daily PRN Delfin Gant, NP      . ondansetron (ZOFRAN) tablet 4 mg  4 mg Oral Q8H PRN Delfin Gant, NP      . traZODone (DESYREL) tablet 50 mg  50 mg Oral QHS Jaydon Avina       PTA Medications: No prescriptions prior to admission    Previous Psychotropic Medications: Yes   Substance Abuse History in the last 12 months:  No.    Consequences of Substance Abuse: NA  Results for orders placed or performed during the hospital encounter of 09/18/14 (from the past 72 hour(s))  Urinalysis, Routine w reflex microscopic (not at [facility])     Status: None   Collection Time: 09/18/14  2:32 AM  Result Value Ref Range   Color,  Urine YELLOW YELLOW   APPearance CLEAR CLEAR   Specific Gravity, Urine 1.017 1.005 - 1.030   pH 6.0 5.0 - 8.0   Glucose, UA NEGATIVE NEGATIVE mg/dL   Hgb urine dipstick NEGATIVE NEGATIVE   Bilirubin Urine NEGATIVE NEGATIVE   Ketones, ur NEGATIVE NEGATIVE mg/dL   Protein, ur NEGATIVE NEGATIVE mg/dL   Urobilinogen, UA 0.2 0.0 - 1.0 mg/dL   Nitrite NEGATIVE NEGATIVE   Leukocytes, UA NEGATIVE NEGATIVE    Comment: MICROSCOPIC NOT DONE ON URINES WITH NEGATIVE PROTEIN, BLOOD, LEUKOCYTES, NITRITE, OR GLUCOSE <1000 mg/dL.  Urine rapid drug screen (hosp performed)  Status: None   Collection Time: 09/18/14  2:32 AM  Result Value Ref Range   Opiates NONE DETECTED NONE DETECTED   Cocaine NONE DETECTED NONE DETECTED   Benzodiazepines NONE DETECTED NONE DETECTED   Amphetamines NONE DETECTED NONE DETECTED   Tetrahydrocannabinol NONE DETECTED NONE DETECTED   Barbiturates NONE DETECTED NONE DETECTED    Comment:        DRUG SCREEN FOR MEDICAL PURPOSES ONLY.  IF CONFIRMATION IS NEEDED FOR ANY PURPOSE, NOTIFY LAB WITHIN 5 DAYS.        LOWEST DETECTABLE LIMITS FOR URINE DRUG SCREEN Drug Class       Cutoff (ng/mL) Amphetamine      1000 Barbiturate      200 Benzodiazepine   194 Tricyclics       174 Opiates          300 Cocaine          300 THC              50   Comprehensive metabolic panel     Status: Abnormal   Collection Time: 09/18/14  2:33 AM  Result Value Ref Range   Sodium 139 135 - 145 mmol/L   Potassium 3.3 (L) 3.5 - 5.1 mmol/L   Chloride 108 101 - 111 mmol/L   CO2 23 22 - 32 mmol/L   Glucose, Bld 92 65 - 99 mg/dL   BUN 9 6 - 20 mg/dL   Creatinine, Ser 0.60 0.44 - 1.00 mg/dL   Calcium 9.0 8.9 - 10.3 mg/dL   Total Protein 7.8 6.5 - 8.1 g/dL   Albumin 4.7 3.5 - 5.0 g/dL   AST 19 15 - 41 U/L   ALT 20 14 - 54 U/L   Alkaline Phosphatase 66 38 - 126 U/L   Total Bilirubin 0.6 0.3 - 1.2 mg/dL   GFR calc non Af Amer >60 >60 mL/min   GFR calc Af Amer >60 >60 mL/min    Comment:  (NOTE) The eGFR has been calculated using the CKD EPI equation. This calculation has not been validated in all clinical situations. eGFR's persistently <60 mL/min signify possible Chronic Kidney Disease.    Anion gap 8 5 - 15  CBC with Differential/Platelet     Status: None   Collection Time: 09/18/14  2:33 AM  Result Value Ref Range   WBC 8.7 4.0 - 10.5 K/uL   RBC 4.35 3.87 - 5.11 MIL/uL   Hemoglobin 13.8 12.0 - 15.0 g/dL   HCT 38.3 36.0 - 46.0 %   MCV 88.0 78.0 - 100.0 fL   MCH 31.7 26.0 - 34.0 pg   MCHC 36.0 30.0 - 36.0 g/dL   RDW 11.7 11.5 - 15.5 %   Platelets 214 150 - 400 K/uL   Neutrophils Relative % 72 43 - 77 %   Neutro Abs 6.2 1.7 - 7.7 K/uL   Lymphocytes Relative 22 12 - 46 %   Lymphs Abs 1.9 0.7 - 4.0 K/uL   Monocytes Relative 5 3 - 12 %   Monocytes Absolute 0.4 0.1 - 1.0 K/uL   Eosinophils Relative 1 0 - 5 %   Eosinophils Absolute 0.1 0.0 - 0.7 K/uL   Basophils Relative 0 0 - 1 %   Basophils Absolute 0.0 0.0 - 0.1 K/uL  Acetaminophen level     Status: Abnormal   Collection Time: 09/18/14  2:33 AM  Result Value Ref Range   Acetaminophen (Tylenol), Serum <10 (L) 10 - 30 ug/mL  Comment:        THERAPEUTIC CONCENTRATIONS VARY SIGNIFICANTLY. A RANGE OF 10-30 ug/mL MAY BE AN EFFECTIVE CONCENTRATION FOR MANY PATIENTS. HOWEVER, SOME ARE BEST TREATED AT CONCENTRATIONS OUTSIDE THIS RANGE. ACETAMINOPHEN CONCENTRATIONS >150 ug/mL AT 4 HOURS AFTER INGESTION AND >50 ug/mL AT 12 HOURS AFTER INGESTION ARE OFTEN ASSOCIATED WITH TOXIC REACTIONS.   Ethanol     Status: Abnormal   Collection Time: 09/18/14  2:33 AM  Result Value Ref Range   Alcohol, Ethyl (B) 127 (H) <5 mg/dL    Comment:        LOWEST DETECTABLE LIMIT FOR SERUM ALCOHOL IS 5 mg/dL FOR MEDICAL PURPOSES ONLY   Salicylate level     Status: None   Collection Time: 09/18/14  2:33 AM  Result Value Ref Range   Salicylate Lvl <9.5 2.8 - 30.0 mg/dL  I-Stat Beta hCG blood, ED ([facility], [facility], [facility] only)     Status:  None   Collection Time: 09/18/14  2:37 AM  Result Value Ref Range   I-stat hCG, quantitative <5.0 <5 mIU/mL   Comment 3            Comment:   GEST. AGE      CONC.  (mIU/mL)   <=1 WEEK        5 - 50     2 WEEKS       50 - 500     3 WEEKS       100 - 10,000     4 WEEKS     1,000 - 30,000        FEMALE AND NON-PREGNANT FEMALE:     LESS THAN 5 mIU/mL     Observation Level/Precautions:  15 minute checks  Laboratory:  CBC Chemistry Profile UDS UA  Psychotherapy:  Individual and group sessions  Medications:  Medications will be started as appropriate for patient stabilization   Consultations:  Psychiatry  Discharge Concerns:  Safety, stabilization, and risk of access to medication and medication stabilization   Estimated LOS:  5-7  days  Other:     Psychological Evaluations: Yes   Treatment Plan Summary: Daily contact with patient to assess and evaluate symptoms and progress in treatment and Medication management  1. Admit for crisis management and stabilization 2. Medication management to reduce current symptoms to bale line and improve the patient's overall level of functioning 3. Treat health problems as indicated 4. Develop treatment plan to decrease risk of relapse upon discharge and the need for readmission. 5. Psycho-social education regarding relapse prevention and self care. 6. Health care follow up as needed for medical problems 7. Restart home medications where appropriate.    Continue Prozac 10 mg for depression; start Trazodone 50 mg Q hs prn sleep; Benadryl 25 mg Q 6 hr prn itching  Medical Decision Making:  Review or order clinical lab tests (1), Review and summation of old records (2), Review of Last Therapy Session (1), Independent Review of image, tracing or specimen (2) and Review of Medication Regimen & Side Effects (2)  I certify that inpatient services furnished can reasonably be expected to improve the patient's condition.   Earleen Newport,  FNP-BC 7/24/20165:35 PM Patient seen face-to-face for psychiatric evaluation, chart reviewed and case discussed with the physician extender and developed treatment plan. Reviewed the information documented and agree with the treatment plan. Corena Pilgrim, MD

## 2014-09-19 NOTE — Tx Team (Addendum)
Initial Interdisciplinary Treatment Plan   PATIENT STRESSORS: Financial difficulties Occupational concerns Traumatic event   PATIENT STRENGTHS: Average or above average intelligence Capable of independent living Communication skills Work skills   PROBLEM LIST: Problem List/Patient Goals Date to be addressed Date deferred Reason deferred Estimated date of resolution  "attempted suicide" 10-18-2014     "I have anxiety, panic attacks" 2014-10-18     "I hear my dead friend voice" Oct 18, 2014     "I see things sometimes" 10/18/2014     Depression Oct 18, 2014                              DISCHARGE CRITERIA:  Ability to meet basic life and health needs Improved stabilization in mood, thinking, and/or behavior Need for constant or close observation no longer present Reduction of life-threatening or endangering symptoms to within safe limits Verbal commitment to aftercare and medication compliance  PRELIMINARY DISCHARGE PLAN: Attend aftercare/continuing care group Outpatient therapy Participate in family therapy Return to previous living arrangement  PATIENT/FAMIILY INVOLVEMENT: This treatment plan has been presented to and reviewed with the patient, Alice Castillo, and/or family membe.  The patient and family have been given the opportunity to ask questions and make suggestions.  [name] 09/19/2014, 12:35 AM

## 2014-09-19 NOTE — [facility] Group Notes (Signed)
[facility] Group Notes: (Clinical Social Work)   09/19/2014      Type of Therapy:  Group Therapy   Participation Level:  Did Not Attend despite MHT prompting   Selmer Dominion, LCSW 09/19/2014, 12:51 PM

## 2014-09-19 NOTE — Progress Notes (Signed)
Patient ID: Alice Castillo, female   DOB: [birth year], 19 y.o.   MRN: [id number]   Pt reported insect bites to her bilateral legs, Shavon NP made aware new orders noted. Orders were noted for Benadryl and Hydrocortisone cream.
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
## 2014-09-20 NOTE — Plan of Care (Signed)
Problem: Ineffective individual coping Goal: STG: Patient will remain free from self harm Outcome: Progressing Q 15 minutes checks maintained for safety as ordered without gestures or incident of self injurious behavior to note thus far this shift.

## 2014-09-20 NOTE — Progress Notes (Signed)
Did not attend group, remained in her room.

## 2014-09-20 NOTE — [facility] Group Notes (Signed)
[facility] LCSW Group Therapy  09/20/2014 1:15 pm  Type of Therapy: Process Group Therapy  Participation Level:  In bed asleep  Summary of Progress/Problems: Today's group addressed the issue of overcoming obstacles.  Patients were asked to identify their biggest obstacle post d/c that stands in the way of their on-going success, and then problem solve as to how to manage this.  Trish Mage 09/20/2014   2:41 PM

## 2014-09-20 NOTE — [facility] Suicide Risk Assessment (Signed)
[facility] INPATIENT:  Family/Significant Other Suicide Prevention Education  Suicide Prevention Education:  Education Completed; [name], mother, 23 16 has been identified by the patient as the family member/significant other with whom the patient will be residing, and identified as the person(s) who will aid the patient in the event of a mental health crisis (suicidal ideations/suicide attempt).  With written consent from the patient, the family member/significant other has been provided the following suicide prevention education, prior to the and/or following the discharge of the patient.  The suicide prevention education provided includes the following:  Suicide risk factors  Suicide prevention and interventions  National Suicide Hotline telephone number  [facility] assessment telephone number  [facility] Emergency Assistance [place] and/or Residential Mobile Crisis Unit telephone number  Request made of family/significant other to:  Remove weapons (e.g., guns, rifles, knives), all items previously/currently identified as safety concern.    Remove drugs/medications (over-the-counter, prescriptions, illicit drugs), all items previously/currently identified as a safety concern.  The family member/significant other verbalizes understanding of the suicide prevention education information provided.  The family member/significant other agrees to remove the items of safety concern listed above.  Alice Castillo 09/20/2014, 5:20 PM

## 2014-09-20 NOTE — B[facility] Group Notes (Signed)
[facility] LCSW Aftercare Discharge Planning Group Note   09/20/2014 11:57 AM  Participation Quality:  Minimal  Mood/Affect:  Depressed  Depression Rating:  denies  Anxiety Rating:  denies  Thoughts of Suicide:  No Will you contract for safety?   NA  Current AVH:  No  Plan for Discharge/Comments:  Reluctant to give information.  States it was the cops idea for her to come in, and her mother "may be concerned about depression."  Denies symptoms, and states she is ready to go.  Denies previous hospitalizations and outpt services  Transportation Means:   Supports: friend  [place], Ernestine Mcmurray

## 2014-09-20 NOTE — Progress Notes (Signed)
D: Pt presents with flat affect and irritable mood. Denies SI, HI, pain and AVH when assessed. Pt rated her depression 3/10, hopelessness 0/10 and anxiety 0/10. Pt's goal for today is "getting out". Pt took her medication with increased verbal prompts, stated " don't want to be like a zombie". Pt attended group as scheduled this AM. Pt was moved to 400 hall this shift as per MD's order. A: Emotional support and availability offered. Pt encouraged to comply with current treatment regimen. Verbal education done on scheduled Zoloft prior to administration. Q 15 minutes checks maintained for pt's safety as ordered.  R: Pt denies adverse drug reaction when assessed. Remains safe on and off unit. Continue plan of care.
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
## 2014-09-21 MED ORDER — POTASSIUM CHLORIDE CRYS ER 20 MEQ PO TBCR
20.0000 meq | EXTENDED_RELEASE_TABLET | Freq: Two times a day (BID) | ORAL | Status: DC
  Administered 2014-09-21 – 2014-09-22 (×2): 20 meq via ORAL
  Filled 2014-09-21: qty 1
  Filled 2014-09-21: qty 6
  Filled 2014-09-21 (×3): qty 1
  Filled 2014-09-21: qty 6

## 2014-09-21 NOTE — Progress Notes (Signed)
Recreation Therapy Notes  Animal-Assisted Activity (AAA) Program Checklist/Progress Notes Patient Eligibility Criteria Checklist & Daily Group note for Rec Tx Intervention  Date: 07.26.16 Time: 2:45 pm Location: [address]  AAA/T Program Assumption of Risk Form signed by Patient/ or Parent Legal Guardian yes  Patient is free of allergies or sever asthma yes  Patient reports no fear of animals yes  Patient reports no history of cruelty to animalsyes  Patient understands his/her participation is voluntary yes  Patient washes hands before animal contact yes  Patient washes hands after animal contact yes  Education: Hand Washing, Appropriate Animal Interaction   Education Outcome: Acknowledges understanding/In group clarification offered/Needs additional education.   Clinical Observations/Feedback:  Patient did not attend group.   Victorino Sparrow, LRT/CTRS         Ria Comment, Saniah Schroeter A 09/21/2014 4:05 PM

## 2014-09-21 NOTE — B[facility] Group Notes (Signed)
[facility] LCSW Group Therapy 09/21/2014 1:15 PM  Type of Therapy: Group Therapy- Feelings about Diagnosis  Pt did not attend, declined invitation.  Alice Castillo, [place] 09/21/2014 5:10 PM

## 2014-09-21 NOTE — Progress Notes (Signed)
D. Pt had been in room for much of the evening, pt did not attend evening group activity. Pt engaged minimally with staff but the interaction she did have was polite and thought process appeared coherent and logical. Pt reported that she was doing ok, did refuse bedtime medication and spoke of how she did not need it and also did not verbalize any complaints and denied any SI this evening. A. Support and encouragement provided. R. Safety maintained, will continue to monitor.

## 2014-09-21 NOTE — B[facility] Group Notes (Signed)
[place] Group Notes:  (Nursing/MHT/Case Management/Adjunct)  Date:  09/21/2014  Time:  9:57 AM  Type of Therapy:  Nurse Education  Participation Level:  Minimal  Participation Quality:  Inattentive  Affect:  Depressed  Cognitive:  Preoccupied  Insight:  Limited  Engagement in Group:  Resistant  Modes of Intervention:  Discussion and Education  Summary of Progress/Problems: The purpose of this group is to discuss the topic of the day which is Recovery. Patients did a Recovery activity and patient's were encouraged to fill it out. Patient was seen looking at the activity sheet however did not fill it out and did not want to share her goal for the day. Patient appeared preoccupied.  Gaylan Gerold E 09/21/2014, 9:57 AM

## 2014-09-21 NOTE — Progress Notes (Signed)
[facility] MD Progress Note  09/21/2014 8:09 PM Alice Castillo  MRN:  [id number] Subjective:  Alice Castillo is willing to share more information, her friend who died killed herself. She saw her that same day and cleaned her self induced lacerations. She left her thinking that she was safe and later that day she hung herself. She irrationally blamed herself. She states she could have done more. She had removed all the knifes the blades but did not think about her hanging. She has had experiences of being pull from her bed, seeing and hearing the friend. States this happens very rarely and only when she "brakes down." states she is safe to go home. She will not hurt herself. States if the police would not have come she [place] 30 to 40 more minutes and then leave.  Principal Problem: Major depressive disorder, recurrent episode, severe, with psychosis Diagnosis:   Patient Active Problem List   Diagnosis Date Noted  . Major depressive disorder, recurrent, severe with psychotic features [F33.3] 09/18/2014  . Major depressive disorder, recurrent episode, severe, with psychosis [F33.3] 09/18/2014  . Hallucinations [R44.3]   . Severe recurrent major depressive disorder with psychotic features [F33.3]    Total Time spent with patient: 30 minutes   Past Medical History: History reviewed. No pertinent past medical history. History reviewed. No pertinent past surgical history. Family History: History reviewed. No pertinent family history. Social History:  History  Alcohol Use  . 0.6 oz/week  . 1 Shots of liquor per week     History  Drug Use No    History   Social History  . Marital Status: Single    Spouse Name: N/A  . Number of Children: N/A  . Years of Education: N/A   Social History Main Topics  . Smoking status: Never Smoker   . Smokeless tobacco: Not on file  . Alcohol Use: 0.6 oz/week    1 Shots of liquor per week  . Drug Use: No  . Sexual Activity: Yes    Birth  Control/ Protection: Condom   Other Topics Concern  . None   Social History Narrative   Additional History:    Sleep: Poor  Appetite:  Fair   Assessment:   Musculoskeletal: Strength & Muscle Tone: within normal limits Gait & Station: normal Patient leans: normal   Psychiatric Specialty Exam: Physical Exam  Review of Systems  Constitutional: Negative.   HENT: Negative.   Eyes: Negative.   Respiratory: Negative.   Cardiovascular: Negative.   Gastrointestinal: Negative.   Genitourinary: Negative.   Musculoskeletal: Negative.   Skin: Negative.   Neurological: Negative.   Endo/Heme/Allergies: Negative.   Psychiatric/Behavioral: Positive for depression. The patient is nervous/anxious and has insomnia.     Blood pressure 102/77, pulse 87, temperature 98.7 F (37.1 C), temperature source Oral, resp. rate 16, height 5' 6" (1.676 m), weight 54.432 kg (120 lb), last menstrual period 09/04/2014.Body mass index is 19.38 kg/(m^2).  General Appearance: Fairly Groomed  Engineer, water::  Fair  Speech:  Clear and Coherent  Volume:  Decreased  Mood:  Anxious and Depressed  Affect:  Restricted  Thought Process:  Coherent and Goal Directed  Orientation:  Full (Time, Place, and Person)  Thought Content:  symptoms events worries concerns her wanting to go home  Suicidal Thoughts:  No  Homicidal Thoughts:  No  Memory:  Immediate;   Fair Recent;   Fair Remote;   Fair  Judgement:  Fair  Insight:  Present  Psychomotor Activity:  Decreased  Concentration:  Fair  Recall:  [employer] of Knowledge:Fair  Language: Fair  Akathisia:  No  Handed:  Right  AIMS (if indicated):     Assets:  Desire for Improvement  ADL's:  Intact  Cognition: WNL  Sleep:  Number of Hours: 3.5     Current Medications: Current Facility-Administered Medications  Medication Dose Route Frequency Provider Last Rate Last Dose  . acetaminophen (TYLENOL) tablet 650 mg  650 mg Oral Q6H PRN Delfin Gant, NP       . alum & mag hydroxide-simeth (MAALOX/MYLANTA) 200-200-20 MG/5ML suspension 30 mL  30 mL Oral Q4H PRN Delfin Gant, NP      . diphenhydrAMINE (BENADRYL) capsule 25 mg  25 mg Oral Q6H PRN Shuvon B Rankin, NP      . FLUoxetine (PROZAC) capsule 10 mg  10 mg Oral Daily Mojeed Akintayo   10 mg at 09/21/14 0806  . hydrocortisone cream 1 %   Topical QID PRN Shuvon B Rankin, NP      . ibuprofen (ADVIL,MOTRIN) tablet 600 mg  600 mg Oral Q8H PRN Delfin Gant, NP      . LORazepam (ATIVAN) tablet 1 mg  1 mg Oral Q8H PRN Delfin Gant, NP      . magnesium hydroxide (MILK OF MAGNESIA) suspension 30 mL  30 mL Oral Daily PRN Delfin Gant, NP      . ondansetron (ZOFRAN) tablet 4 mg  4 mg Oral Q8H PRN Delfin Gant, NP      . potassium chloride SA (K-DUR,KLOR-CON) CR tablet 20 mEq  20 mEq Oral BID Encarnacion Slates, NP   20 mEq at 09/21/14 1710  . traZODone (DESYREL) tablet 50 mg  50 mg Oral QHS Mojeed Akintayo   50 mg at 09/19/14 2145    Lab Results: No results found for this or any previous visit (from the past 48 hour(s)).  Physical Findings: AIMS: Facial and Oral Movements Muscles of Facial Expression: None, normal Lips and Perioral Area: None, normal Jaw: None, normal Tongue: None, normal,Extremity Movements Upper (arms, wrists, hands, fingers): None, normal Lower (legs, knees, ankles, toes): None, normal, Trunk Movements Neck, shoulders, hips: None, normal, Overall Severity Severity of abnormal movements (highest score from questions above): None, normal Incapacitation due to abnormal movements: None, normal Patient's awareness of abnormal movements (rate only patient's report): No Awareness, Dental Status Current problems with teeth and/or dentures?: No Does patient usually wear dentures?: No  CIWA:  CIWA-Ar Total: 0 COWS:     Treatment Plan Summary: Daily contact with patient to assess and evaluate symptoms and progress in treatment and Medication  management Supportive approach/coping skills Depression; will continue the Prozac at 20 mg daily R/O PTSD; will use the Prozac and recommend therapy to address the trauma She had mentioned that his brother who lives in [place] will be coming to see her. She mentioned that he had offered to get her to [place] and stay with him get a job there etc I met with her brother Oswaldo Milian and he confirmed that he would be more than happy if she would want her to come to [place] with him. He states she has been more open in the last 2 days on the phone than she has been before. He states that she is very loyal to her friends and very sensitive. Will reassess for a possible D/C in the AM   Medical Decision Making:  Review of Psycho-Social Stressors (  1) and Review of Medication Regimen & Side Effects (2)     , A 09/21/2014, 8:09 PM

## 2014-09-21 NOTE — Progress Notes (Signed)
Patient ID: Anamika Kueker, female   DOB: 04-23-1995, 19 y.o.   MRN: [id number]  A friend of Shanina, [place], came to the lobby and asked to speak with writer about some concerns and questions he had. Per written permission Probation officer and Otila Kluver T. RN A/C spoke with Slane. In summary he shared that he was frustrated about the code numbers and how he was not able to get the code number initially. Writer and A/C spoke to him about the importance of the patient's safety and privacy. He was also reminded of visitation hours because he mentioned that he waited in the lobby, "since 1 pm the other day." Slane also mentioned that he was not "accomodated" with a private space to speak with patient. Through the conversation it was revealed that he did not notify staff of his request and was reminded that our rooms are double occupancy. He verbalized understanding. Writer and A/C educated him about the importance of our adult patients making their own decisions and involvement in their care. Writer relayed a message to patient that Littie Deeds would like her to speak with him.

## 2014-09-21 NOTE — Tx Team (Addendum)
Interdisciplinary Treatment Plan Update (Adult) Date: 09/21/2014  Date: 09/21/2014 11:29 AM  Progress in Treatment:  Attending groups: Yes  Participating in groups: Yes  Taking medication as prescribed: Yes  Tolerating medication: Yes  Family/Significant othe contact made: Yes, with mother  Patient understands diagnosis: No Discussing patient identified problems/goals with staff: Yes  Medical problems stabilized or resolved: Yes  Denies suicidal/homicidal ideation: Yes Patient has not harmed self or Others: Yes   New problem(s) identified: None identified at this time.   Discharge Plan or Barriers: Pt will return to a friend's home and possibly follow-up at [facility] and with the [facility].  Additional comments:  Alice Castillo is a 19 YO caucasian female who is here for SI secondary to depression. She cited psychosis prior to admission, but denies today. She says she has been depressed since age 52 when her sister falsely accused her father of SA, saw a therapist once that her mother made her go to, and has no hospitalizations nor outpt treatment. She left home at 43 and has been living indepently since. She is reluctant to take medication and to follow up with anyone outpt.   Reason for Continuation of Hospitalization:  Depression Medication stabilization Suicidal ideation Psychotic  Estimated length of stay: 3-5 days  Review of initial/current patient goals per problem list:   1.  Goal(s): Patient will participate in aftercare plan  Met: YEs  Target date: 09/24/14  As evidenced by: Patient will participate within aftercare plan AEB aftercare provider and housing plan at discharge being identified.   7/26: Currently, Pt plans to return to friend's home. Pt is reluctant to consider outpatient therapy and medication management as she is skeptical of therapists.  7/27: Pt will return to friend's home and follow-up with [facility]  2.  Goal (s): Patient  will exhibit decreased depressive symptoms and suicidal ideations.  Met:  Yes  Target date:  As evidenced by: Patient will utilize self rating of depression at 3 or below and demonstrate decreased signs of depression or be deemed stable for discharge by MD.  5.  Goal(s): Patient will demonstrate decreased signs of psychosis  . Met:  Yes . Target date: 09/24/14 . As evidenced by: Patient will demonstrate decreased frequency of AVH or return to baseline function   -7/26: Pt denies AVH, however continues to exhibit some signs of paranoia related to seeking outpatient   Treatment.   -7/27: Pt denies AVH and is less paranoid about seeking outpatient treatment.   Attendees:  Patient:    Family:    Physician: Dr. Parke Poisson, MD  09/21/2014 11:29 AM  Nursing: Lars Pinks, RN Case manager  09/21/2014 11:29 AM  Clinical Social Worker Norman Clay, MSW 09/21/2014 11:29 AM  Other: Jake Bathe Liasion 09/21/2014 11:29 AM  Clinical: Grayland Ormond, RN; Gaylan Gerold, RNN 09/21/2014 11:29 AM  Other: , RN Charge Nurse 09/21/2014 11:29 AM  Other:     Peri Maris, Latanya Presser MSW

## 2014-09-21 NOTE — Progress Notes (Signed)
Patient ID: Alice Castillo, female   DOB: 1995-09-29, 19 y.o.   MRN: [id number]  DAR: Pt. Denies SI/HI and A/V Hallucinations. She rates her depression, anxiety, and hopelessness at 0/10. She reports her sleep last night was poor, appetite is fair, energy level is normal, and her concentration level is good. Affect remains flat and mood depressed. Patient does not report any pain or discomfort at this time. Support and encouragement provided to the patient. Scheduled medications administered to patient per physician's orders. No PRN medications necessary at this time. Patient remains minimal and forwards very little. She remains in her room throughout the day but does opt to attend some groups. She keeps to herself in the milieu. Q15 minute checks are maintained for safety.

## 2014-09-21 NOTE — Progress Notes (Signed)
[facility] Group Notes:  (Nursing/MHT/Case Management/Adjunct)  Did not attend group, remained in room reading.  Jeanette Caprice 09/21/2014, 9:18 PM

## 2014-09-21 NOTE — Progress Notes (Signed)
Pt attended spiritual care group on grief and loss facilitated by chaplain Jerene Pitch. Group opened with brief discussion and psycho-social ed around grief and loss in relationships and in relation to self - identifying life patterns, circumstances, changes that cause losses. Established group norm of speaking from own life experience. Group goal of establishing open and affirming space for members to share loss and experience with grief, normalize grief experience and provide psycho social education and grief support.  Group drew on narrative and Adlerian therapeutic modalities.  Myrl was present throughout group.  She presented with flat affect and was curled up in her chair.  She was attentive to group members and stayed throughout the group.  Facilitator created space for those who did not speak to be able to contribute, but she did not contribute to group discussion.    [place], [place]

## 2014-09-21 NOTE — Plan of Care (Signed)
Problem: Alteration in thought process Goal: STG-Patient is able to follow short directions Outcome: Progressing Patient is able to follow short directions although she continues to have thought blocking, slow response times

## 2014-09-22 DIAGNOSIS — F4310 Post-traumatic stress disorder, unspecified: Secondary | ICD-10-CM | POA: Diagnosis present

## 2014-09-22 MED ORDER — POTASSIUM CHLORIDE CRYS ER 20 MEQ PO TBCR: 20.0000 meq | EXTENDED_RELEASE_TABLET | Freq: Two times a day (BID) | ORAL | Status: DC

## 2014-09-22 MED ORDER — FLUOXETINE HCL 10 MG PO CAPS: 10.0000 mg | ORAL_CAPSULE | Freq: Every day | ORAL | Status: DC

## 2014-09-22 MED ORDER — TRAZODONE HCL 50 MG PO TABS: 50.0000 mg | ORAL_TABLET | Freq: Every day | ORAL | Status: DC

## 2014-09-22 NOTE — Discharge Summary (Signed)
Physician Discharge Summary Note  Patient:  Alice Castillo is an 19 y.o., female MRN:  [id number] DOB:  04/21/1995 Patient phone:  [phone] (home)  Patient address:   [place] [address],  Total Time spent with patient: 45 minutes  Date of Admission:  09/18/2014 Date of Discharge: 09/22/2014  Reason for Admission:  depression  Principal Problem: Major depressive disorder, recurrent episode, severe, with psychosis Discharge Diagnoses: Patient Active Problem List   Diagnosis Date Noted  . Major depressive disorder, recurrent, severe with psychotic features [F33.3] 09/18/2014  . Major depressive disorder, recurrent episode, severe, with psychosis [F33.3] 09/18/2014  . Hallucinations [R44.3]   . Severe recurrent major depressive disorder with psychotic features [F33.3]     Musculoskeletal: Strength & Muscle Tone: within normal limits Gait & Station: normal Patient leans: N/A  Psychiatric Specialty Exam: Physical Exam  Vitals reviewed. Psychiatric: Her mood appears anxious. She does not exhibit a depressed mood.    Review of Systems  Cardiovascular: Negative for chest pain.  All other systems reviewed and are negative.   Blood pressure 106/77, pulse 88, temperature 99.1 F (37.3 C), temperature source Oral, resp. rate 16, height 5\' 6"  (1.676 m), weight 54.432 kg (120 lb), last menstrual period 09/04/2014.Body mass index is 19.38 kg/(m^2).   General Appearance: Fairly Groomed  Engineer, water:: Fair  Speech: Clear and [id number]  Volume: Decreased  Mood: excited that she is being D/C  Affect: Appropriate  Thought Process: Coherent and Goal Directed  Orientation: Full (Time, Place, and Person)  Thought Content: plans as she moves on  Suicidal Thoughts: No  Homicidal Thoughts: No  Memory: Immediate; Fair Recent; Fair Remote; Fair  Judgement: Fair  Insight: Present  Psychomotor Activity: Normal  Concentration:  Fair  Recall: [employer] of [place]  Language: Fair  Akathisia: No  Handed: Right  AIMS (if indicated):    Assets: Desire for Improvement Housing Social Support  Sleep: Number of Hours: 3.5  Cognition: WNL  ADL's: Intact       Have you used any form of tobacco in the last 30 days? (Cigarettes, Smokeless Tobacco, Cigars, and/or Pipes): No  Has this patient used any form of tobacco in the last 30 days? (Cigarettes, Smokeless Tobacco, Cigars, and/or Pipes) N/A  Past Medical History: History reviewed. No pertinent past medical history. History reviewed. No pertinent past surgical history. Family History: History reviewed. No pertinent family history. Social History:  History  Alcohol Use  . 0.6 oz/week  . 1 Shots of liquor per week     History  Drug Use No    History   Social History  . Marital Status: Single    Spouse Name: N/A  . Number of Children: N/A  . Years of Education: N/A   Social History Main Topics  . Smoking status: Never Smoker   . Smokeless tobacco: Not on file  . Alcohol Use: 0.6 oz/week    1 Shots of liquor per week  . Drug Use: No  . Sexual Activity: Yes    Birth Control/ Protection: Condom   Other Topics Concern  . None   Social History Narrative   Risk to Self: Is patient at risk for suicide?: Yes What has been your use of drugs/alcohol within the last 12 months?: States she drinks socially Risk to Others:   Prior Inpatient Therapy:   Prior Outpatient Therapy:    Level of Care:  OP  Hospital Course:  Alice Castillo, 19 yo came in verbalizing suicidal  thoughts.  Alice Castillo was admitted for Major depressive disorder, recurrent episode, severe, with psychosis and crisis management.  She was treated discharged with the medications listed below under Medication List.  Medical problems were identified and treated as needed.  Home medications were restarted as appropriate.  Improvement was monitored by observation and  Alice Castillo daily report of symptom reduction.  Emotional and mental status was monitored by daily self-inventory reports completed by Alice Castillo and clinical staff.         Alice Castillo was evaluated by the treatment team for stability and plans for continued recovery upon discharge.  Alice Castillo motivation was an integral factor for scheduling further treatment.  Employment, transportation, bed availability, health status, family support, and any pending legal issues were also considered during her hospital stay.  She was offered further treatment options upon discharge including but not limited to Residential, Intensive Outpatient, and Outpatient treatment.  Alice Castillo will follow up with the services as listed below under Follow Up Information.     Upon completion of this admission the patient was both mentally and medically stable for discharge denying suicidal/homicidal ideation, auditory/visual/tactile hallucinations, delusional thoughts and paranoia.       Consults:  psychiatry  Significant Diagnostic Studies:  labs: per ED  Discharge Vitals:   Blood pressure 106/77, pulse 88, temperature 99.1 F (37.3 C), temperature source Oral, resp. rate 16, height 5\' 6"  (1.676 m), weight 54.432 kg (120 lb), last menstrual period 09/04/2014. Body mass index is 19.38 kg/(m^2). Lab Results:   No results found for this or any previous visit (from the past 72 hour(s)).  Physical Findings: AIMS: Facial and Oral Movements Muscles of Facial Expression: None, normal Lips and Perioral Area: None, normal Jaw: None, normal Tongue: None, normal,Extremity Movements Upper (arms, wrists, hands, fingers): None, normal Lower (legs, knees, ankles, toes): None, normal, Trunk Movements Neck, shoulders, hips: None, normal, Overall Severity Severity of abnormal movements (highest score from questions above): None, normal Incapacitation due to abnormal movements: None, normal Patient's awareness of  abnormal movements (rate only patient's report): No Awareness, Dental Status Current problems with teeth and/or dentures?: No Does patient usually wear dentures?: No  CIWA:  CIWA-Ar Total: 0 COWS:     See Psychiatric Specialty Exam and Suicide Risk Assessment completed by Attending Physician prior to discharge.  Discharge destination:  Home  Is patient on multiple antipsychotic therapies at discharge:  No   Has Patient had three or more failed trials of antipsychotic monotherapy by history:  No  Recommended Plan for Multiple Antipsychotic Therapies: NA    Medication List    TAKE these medications      Indication   FLUoxetine 10 MG capsule  Commonly known as:  PROZAC  Take 1 capsule (10 mg total) by mouth daily.   Indication:  Excessive Use of Alcohol, Depression     potassium chloride SA 20 MEQ tablet  Commonly known as:  K-DUR,KLOR-CON  Take 1 tablet (20 mEq total) by mouth 2 (two) times daily.   Indication:  Low Amount of Potassium in the Blood     traZODone 50 MG tablet  Commonly known as:  DESYREL  Take 1 tablet (50 mg total) by mouth at bedtime.   Indication:  Aggressive Behavior, Alcohol Withdrawal Syndrome, Trouble Sleeping           Follow-up Information    Follow up with [facility]   Specialty:  Behavioral Health   Why:  Please walk-in between 8am-3pm Monday-Friday to be  set up with a doctor and therapist.   Contact information:   [place] [address] [phone]       Follow-up recommendations:  Activity:  as tol, diet as tol  Comments:  1.  Take all your medications as prescribed.              2.  Report any adverse side effects to outpatient provider.                       3.  Patient instructed to not use alcohol or illegal drugs while on prescription medicines.            4.  In the event of worsening symptoms, instructed patient to call 911, the crisis hotline or go to nearest emergency room for evaluation of symptoms.  Total  Discharge Time:  40 min  Signed: Freda Munro May Agustin AGNP-BC 09/22/2014, 1:29 PM  I personally assessed the patient and formulated the plan Geralyn Flash A. Sabra Heck, M.D.

## 2014-09-22 NOTE — BHH Suicide Risk Assessment (Signed)
[facility] Discharge Suicide Risk Assessment   Demographic Factors:  Adolescent or young adult and Caucasian  Total Time spent with patient: 30 minutes  Musculoskeletal: Strength & Muscle Tone: within normal limits Gait & Station: normal Patient leans: normal  Psychiatric Specialty Exam: Physical Exam  Review of Systems  Constitutional: Negative.   HENT: Negative.   Eyes: Negative.   Respiratory: Negative.   Cardiovascular: Negative.   Gastrointestinal: Negative.   Genitourinary: Negative.   Musculoskeletal: Negative.   Skin: Negative.   Endo/Heme/Allergies: Negative.   Psychiatric/Behavioral: Positive for depression.    Blood pressure 106/77, pulse 88, temperature 99.1 F (37.3 C), temperature source Oral, resp. rate 16, height 5\' 6"  (1.676 m), weight 54.432 kg (120 lb), last menstrual period 09/04/2014.Body mass index is 19.38 kg/(m^2).  General Appearance: Fairly Groomed  Engineer, water::  Fair  Speech:  Clear and [id number]  Volume:  Decreased  Mood:  excited that she is being D/C  Affect:  Appropriate  Thought Process:  Coherent and Goal Directed  Orientation:  Full (Time, Place, and Person)  Thought Content:  plans as she moves on  Suicidal Thoughts:  No  Homicidal Thoughts:  No  Memory:  Immediate;   Fair Recent;   Fair Remote;   Fair  Judgement:  Fair  Insight:  Present  Psychomotor Activity:  Normal  Concentration:  Fair  Recall:  [employer] [place]  Language: Fair  Akathisia:  No  Handed:  Right  AIMS (if indicated):     Assets:  Desire for Improvement Housing Social Support  Sleep:  Number of Hours: 3.5  Cognition: WNL  ADL's:  Intact   Have you used any form of tobacco in the last 30 days? (Cigarettes, Smokeless Tobacco, Cigars, and/or Pipes): No  Has this patient used any form of tobacco in the last 30 days? (Cigarettes, Smokeless Tobacco, Cigars, and/or Pipes) No  Mental Status Per Nursing Assessment::   On Admission:  Suicidal ideation  indicated by patient  Current Mental Status by Physician: In full contact with reality. There are no active SI plans or intent. She states she is going to consider going to counseling. From previous experience does not know what a counselor can do for her but she is willing to give it a try. She is now thinking she will go with his brother to [place]. She states that what happened her "braking down" does not happen often. She trusts that she she is going to be able to handle it better if there was a next time. If she is in [place] she can anticipate having a much stable situation, a better job, his brother available and maybe a counselor   Loss Factors: Loss of significant relationship  Historical Factors: NA  Risk Reduction Factors:   Sense of responsibility to family and Positive social support  Continued Clinical Symptoms:  Depression:   Impulsivity  Cognitive Features That Contribute To Risk:  None    Suicide Risk:  Minimal: No identifiable suicidal ideation.  Patients presenting with no risk factors but with morbid ruminations; may be classified as minimal risk based on the severity of the depressive symptoms  Principal Problem: Major depressive disorder, recurrent episode, severe, with psychosis Discharge Diagnoses:  Patient Active Problem List   Diagnosis Date Noted  . Major depressive disorder, recurrent, severe with psychotic features [F33.3] 09/18/2014  . Major depressive disorder, recurrent episode, severe, with psychosis [F33.3] 09/18/2014  . Hallucinations [R44.3]   . Severe recurrent major depressive disorder with  psychotic features [F33.3]     Follow-up Information    Follow up with [facility].   Specialty:  Behavioral Health   Why:  Please walk-in between 8am-3pm Monday-Friday to be set up with a doctor and therapist.   Contact information:   [place] [address] [phone]       Plan Of Care/Follow-up recommendations:  Activity:  as  tolerated Diet:  regular Follow up as above or in [place].  Is patient on multiple antipsychotic therapies at discharge:  No   Has Patient had three or more failed trials of antipsychotic monotherapy by history:  No  Recommended Plan for Multiple Antipsychotic Therapies: NA    Ram Haugan A 09/22/2014, 1:37 PM

## 2014-09-22 NOTE — Progress Notes (Signed)
Recreation Therapy Notes  Date: 07.27.16 Time: 9:30 am Location: [address] Hall Group Room  Group Topic: Stress Management  Goal Area(s) Addresses:  Patient will verbalize importance of using healthy stress management.  Patient will identify positive emotions associated with healthy stress management.   Intervention: Stress Management  Activity :  Guided Automotive engineer.  LRT introduced the technique of guided imagery.  A script was used to deliver the technique to the patients.  Patients were asked to follow the script read a loud by LRT to engage in the technique of guided imagery.  Education:  Stress Management, Discharge Planning.   Education Outcome: Acknowledges edcuation/In group clarification offered/Needs additional education  Clinical Observations/Feedback: Patient did not attend group.   Victorino Sparrow, LRT/CTRS         Victorino Sparrow A 09/22/2014 12:24 PM

## 2014-09-22 NOTE — BHH Group Notes (Signed)
[facility] LCSW Aftercare Discharge Planning Group Note  09/22/2014 8:45 AM  Pt did not attend, declined invitation.   Peri Maris, [place] 09/22/2014 11:05 AM

## 2014-09-22 NOTE — Progress Notes (Signed)
Patient ID: Alice Castillo, female   DOB: 1996-02-19, 19 y.o.   MRN: [id number]  Pt. Denies SI/HI and A/V hallucinations. Belongings returned to patient at time of discharge. Patient denies any pain or discomfort. Discharge instructions and medications were reviewed with patient. Patient verbalized understanding of both medications and discharge instructions. Patient was discharged to lobby where her brother was there to pick her up. No distress noted upon discharge. Q15 minute safety checks until discharge. No distress noted upon discharge.

## 2014-09-22 NOTE — Progress Notes (Signed)
D. Pt had been in room for much of the evening, did not attend evening group activity. Pt did appear withdrawn and did report feeling bored this evening. Pt did not speak much of her day, did state that it was ok, did state that she did not need anything and did not report any pain. Pt also chose not to take evening trazodone and said that she slept ok last night. A. Support and encouragement provided. R. Safety maintained, will continue to monitor.

## 2014-09-22 NOTE — Progress Notes (Signed)
  [facility] Adult Case Management Discharge Plan :  Will you be returning to the same living situation after discharge:  Yes,  home with family At discharge, do you have transportation home?: Yes,  Pt's brother to provide transportation Do you have the ability to pay for your medications: Yes,  Pt provided with supply and prescriptions  Release of information consent forms completed and in the chart;  Patient's signature needed at discharge.  Patient to Follow up at: Follow-up Information    Follow up with [facility].   Specialty:  Behavioral Health   Why:  Please walk-in between 8am-3pm Monday-Friday to be set up with a doctor and therapist.   Contact information:   [place] [address] [phone]       Patient denies SI/HI: Yes,  Pt denies    Safety Planning and Suicide Prevention discussed: Yes,  with mother. See SPE note for further details  Have you used any form of tobacco in the last 30 days? (Cigarettes, Smokeless Tobacco, Cigars, and/or Pipes): No  Has patient been referred to the Quitline?: N/A patient is not a smoker  Bo Mcclintock 09/22/2014, 10:42 AM

## 2015-01-15 ENCOUNTER — Encounter (HOSPITAL_COMMUNITY): Payer: Self-pay | Admitting: Emergency Medicine

## 2015-01-15 ENCOUNTER — Emergency Department (HOSPITAL_COMMUNITY)
Admission: EM | Admit: 2015-01-15 | Discharge: 2015-01-15 | Disposition: A | Discharge status: Home / Self Care | Payer: BLUE CROSS/BLUE SHIELD | Attending: Emergency Medicine | Admitting: Emergency Medicine

## 2015-01-15 ENCOUNTER — Emergency Department (HOSPITAL_COMMUNITY): Payer: BLUE CROSS/BLUE SHIELD

## 2015-01-15 DIAGNOSIS — O9989 Other specified diseases and conditions complicating pregnancy, childbirth and the puerperium: Secondary | ICD-10-CM | POA: Insufficient documentation

## 2015-01-15 DIAGNOSIS — O26899 Other specified pregnancy related conditions, unspecified trimester: Secondary | ICD-10-CM

## 2015-01-15 DIAGNOSIS — R197 Diarrhea, unspecified: Secondary | ICD-10-CM | POA: Diagnosis not present

## 2015-01-15 DIAGNOSIS — R109 Unspecified abdominal pain: Secondary | ICD-10-CM | POA: Insufficient documentation

## 2015-01-15 DIAGNOSIS — Z79899 Other long term (current) drug therapy: Secondary | ICD-10-CM | POA: Diagnosis not present

## 2015-01-15 DIAGNOSIS — Z3A20 20 weeks gestation of pregnancy: Secondary | ICD-10-CM | POA: Insufficient documentation

## 2015-01-15 LAB — CBC WITH DIFFERENTIAL/PLATELET
Basophils Absolute: 0 10*3/uL (ref 0.0–0.1)
Basophils Relative: 0 %
EOS ABS: 0.2 10*3/uL (ref 0.0–0.7)
Eosinophils Relative: 2 %
HEMATOCRIT: 31.3 % — AB (ref 36.0–46.0)
HEMOGLOBIN: 10.7 g/dL — AB (ref 12.0–15.0)
LYMPHS ABS: 2.1 10*3/uL (ref 0.7–4.0)
Lymphocytes Relative: 22 %
MCH: 30.9 pg (ref 26.0–34.0)
MCHC: 34.2 g/dL (ref 30.0–36.0)
MCV: 90.5 fL (ref 78.0–100.0)
MONOS PCT: 6 %
Monocytes Absolute: 0.6 10*3/uL (ref 0.1–1.0)
NEUTROS ABS: 6.9 10*3/uL (ref 1.7–7.7)
NEUTROS PCT: 70 %
Platelets: 200 10*3/uL (ref 150–400)
RBC: 3.46 MIL/uL — AB (ref 3.87–5.11)
RDW: 13.4 % (ref 11.5–15.5)
WBC: 9.9 10*3/uL (ref 4.0–10.5)

## 2015-01-15 LAB — COMPREHENSIVE METABOLIC PANEL
ALBUMIN: 3.5 g/dL (ref 3.5–5.0)
ALK PHOS: 52 U/L (ref 38–126)
ALT: 14 U/L (ref 14–54)
ANION GAP: 9 (ref 5–15)
AST: 16 U/L (ref 15–41)
BILIRUBIN TOTAL: 0.3 mg/dL (ref 0.3–1.2)
CALCIUM: 9.2 mg/dL (ref 8.9–10.3)
CO2: 22 mmol/L (ref 22–32)
Chloride: 104 mmol/L (ref 101–111)
Creatinine, Ser: 0.45 mg/dL (ref 0.44–1.00)
GFR calc non Af Amer: >60 mL/min (ref >60)
GLUCOSE: 86 mg/dL (ref 65–99)
Potassium: 3.4 mmol/L — ABNORMAL LOW (ref 3.5–5.1)
SODIUM: 135 mmol/L (ref 135–145)
TOTAL PROTEIN: 6.5 g/dL (ref 6.5–8.1)

## 2015-01-15 LAB — WET PREP, GENITAL
Clue Cells Wet Prep HPF POC: NONE SEEN
Sperm: NONE SEEN
Trich, Wet Prep: NONE SEEN
YEAST WET PREP: NONE SEEN

## 2015-01-15 LAB — URINALYSIS, ROUTINE W REFLEX MICROSCOPIC
Bilirubin Urine: NEGATIVE
Glucose, UA: NEGATIVE mg/dL
Hgb urine dipstick: NEGATIVE
Ketones, ur: NEGATIVE mg/dL
NITRITE: NEGATIVE
PH: 6.5 (ref 5.0–8.0)
Protein, ur: NEGATIVE mg/dL
SPECIFIC GRAVITY, URINE: 1.014 (ref 1.005–1.030)

## 2015-01-15 LAB — URINE MICROSCOPIC-ADD ON: RBC / HPF: NONE SEEN RBC/hpf (ref 0–5)

## 2015-01-15 MED ORDER — ACETAMINOPHEN 325 MG PO TABS
650.0000 mg | ORAL_TABLET | Freq: Once | ORAL | Status: AC
  Administered 2015-01-15: 650 mg via ORAL
  Filled 2015-01-15: qty 2

## 2015-01-15 NOTE — ED Notes (Signed)
Patient transported to Ultrasound 

## 2015-01-15 NOTE — ED Provider Notes (Signed)
CSN: [id number]     Arrival date & time 01/15/15  0023 History  By signing my name below, I, Jolayne Panther, attest that this documentation has been prepared under the direction and in the presence of Merryl Hacker, MD. Electronically Signed: Jolayne Panther, Scribe. 01/15/2015. 1:05 AM.    Chief Complaint  Patient presents with  . Abdominal Pain     The history is provided by the patient. No language interpreter was used.    HPI Comments: Alice Castillo is a 4 months pregnant 19 y.o. female who presents to the Emergency Department complaining of constant abdominal pain onset earlier today that she currently rates a 3/10. Pt states she has been drinking a lot of water and has recently experienced diarrhea. She has not taken anything for her pain. Pt's states that this is her first pregnancy and that she is due in April. She states that she has not yet been able to feel the baby move. She has only seen a doctor once for her pregnancy and is unsure of her OB, blood type, and LNMP. Difficulty with follow-up.  Pt denies vomiting, vaginal discharge and bleeding.   History reviewed. No pertinent past medical history. History reviewed. No pertinent past surgical history. No family history on file. Social History  Substance Use Topics  . Smoking status: Never Smoker   . Smokeless tobacco: None  . Alcohol Use: 0.6 oz/week    1 Shots of liquor per week   OB History    No data available     Review of Systems  Respiratory: Negative for shortness of breath.   Cardiovascular: Negative for chest pain.  Gastrointestinal: Positive for abdominal pain and diarrhea. Negative for vomiting.  Genitourinary: Negative for vaginal bleeding, vaginal discharge and difficulty urinating.  All other systems reviewed and are negative.     Allergies  Food and Lactose intolerance (gi)  Home Medications   Prior to Admission medications   Medication Sig Start Date End Date Taking?  Authorizing Provider  Prenatal Vit-Fe Fumarate-FA (PRENATAL MULTIVITAMIN) TABS tablet Take 1 tablet by mouth daily at 12 noon.   Yes Historical Provider, MD  FLUoxetine (PROZAC) 10 MG capsule Take 1 capsule (10 mg total) by mouth daily. Patient not taking: Reported on 01/15/2015 09/22/14   Kerrie Buffalo, NP  potassium chloride SA (K-DUR,KLOR-CON) 20 MEQ tablet Take 1 tablet (20 mEq total) by mouth 2 (two) times daily. Patient not taking: Reported on 01/15/2015 09/22/14   Kerrie Buffalo, NP  traZODone (DESYREL) 50 MG tablet Take 1 tablet (50 mg total) by mouth at bedtime. Patient not taking: Reported on 01/15/2015 09/22/14   Kerrie Buffalo, NP   BP 115/69 mmHg  Pulse 95  Temp(Src) 97.9 F (36.6 C) (Oral)  Resp 16  Ht 5\' 6"  (1.676 m)  Wt 132 lb (59.875 kg)  BMI 21.32 kg/m2  SpO2 98% Physical Exam  Constitutional: She is oriented to person, place, and time. She appears well-developed and well-nourished. No distress.  HENT:  Head: Normocephalic and atraumatic.  Cardiovascular: Normal rate, regular rhythm and normal heart sounds.   No murmur heard. Pulmonary/Chest: Effort normal and breath sounds normal. No respiratory distress. She has no wheezes.  Abdominal: Soft. Bowel sounds are normal. There is tenderness. There is no rebound and no guarding.  Gravid to umbilicus  Genitourinary:  Normal external vaginal exam, scant white vaginal discharge, cervical os closed  Neurological: She is alert and oriented to person, place, and time.  Skin: Skin is warm  and dry.  Psychiatric: She has a normal mood and affect.  Nursing note and vitals reviewed.   ED Course  Procedures  DIAGNOSTIC STUDIES:    Oxygen Saturation is 98% on RA, normal by my interpretation.   COORDINATION OF CARE:  2:36 AM  Discussed treatment plan with pt at bedside and pt agreed to plan.     Labs Review Labs Reviewed  URINALYSIS, ROUTINE W REFLEX MICROSCOPIC (NOT AT [facility]) - Abnormal; Notable for the following:     APPearance CLOUDY (*)    Leukocytes, UA TRACE (*)    All other components within normal limits  CBC WITH DIFFERENTIAL/PLATELET - Abnormal; Notable for the following:    RBC 3.46 (*)    Hemoglobin 10.7 (*)    HCT 31.3 (*)    All other components within normal limits  COMPREHENSIVE METABOLIC PANEL - Abnormal; Notable for the following:    Potassium 3.4 (*)    BUN <5 (*)    All other components within normal limits  URINE MICROSCOPIC-ADD ON - Abnormal; Notable for the following:    Squamous Epithelial / LPF 0-5 (*)    Bacteria, UA FEW (*)    Casts GRANULAR CAST (*)    All other components within normal limits  WET PREP, GENITAL  GC/CHLAMYDIA PROBE AMP ([facility]) NOT AT [facility]    Imaging Review US Ob Limited  01/15/2015  CLINICAL DATA:  Sharp abdominal pain. Approximately [redacted] weeks pregnant. EXAM: LIMITED OBSTETRIC ULTRASOUND FINDINGS: Number of Fetuses: 1 Heart Rate:  153 bpm Movement: Yes Presentation: Breech Placental Location: Anterior Previa: No Amniotic Fluid (Subjective):  Within normal limits. BPD:  4.6cm 19w  5d MATERNAL FINDINGS: Cervix:  Appears closed. Uterus/Adnexae: No abnormality visualized. No placental abruption visualized. IMPRESSION: Single living intrauterine fetus in breech presentation. No acute maternal findings visualized. This exam is performed on an emergent basis and does not comprehensively evaluate fetal size, dating, or anatomy; follow-up complete OB US should be considered if further fetal assessment is warranted. Electronically Signed   By: Earle Gell M.D.   On: 01/15/2015 01:43   I have personally reviewed and evaluated these images and lab results as part of my medical decision-making.   EKG Interpretation None      MDM   Final diagnoses:  Abdominal pain affecting pregnancy    Patient presents with abdominal pain during pregnancy. Unknown estimated date of delivery. Unknown last menstrual period. Reports one prior OB visit but has been unable to  follow-up. Nontoxic on exam. Minimal tenderness. She is gravida to the umbilicus. Formal ultrasound shows a 19 5 week intrauterine pregnancy. Otherwise normal.  Patient was given Tylenol. She reports improvement of symptoms.  No evidence of urinary tract infection and lab work is otherwise reassuring. She was [facility]. She was given return precautions. Patient's pain could be related to reflux given location of pain.  After history, exam, and medical workup I feel the patient has been appropriately medically screened and is safe for discharge home. Pertinent diagnoses were discussed with the patient. Patient was given return precautions.  I personally performed the services described in this documentation, which was scribed in my presence. The recorded information has been reviewed and is accurate.     Merryl Hacker, MD 01/15/15 [phone]

## 2015-01-15 NOTE — ED Notes (Signed)
Pt reports that she is 20wks pregnancy and began to experience abdominal pain.  She went last month for Prenatal care.  Due date is April 4th.

## 2015-01-15 NOTE — Discharge Instructions (Signed)

## 2015-01-17 LAB — GC/CHLAMYDIA PROBE AMP (~~LOC~~) NOT AT ARMC
Chlamydia: POSITIVE — AB
Neisseria Gonorrhea: NEGATIVE

## 2015-01-18 ENCOUNTER — Encounter (HOSPITAL_COMMUNITY): Payer: Self-pay | Admitting: *Deleted

## 2015-01-18 ENCOUNTER — Telehealth (HOSPITAL_BASED_OUTPATIENT_CLINIC_OR_DEPARTMENT_OTHER): Payer: Self-pay | Admitting: Emergency Medicine

## 2015-01-18 ENCOUNTER — Inpatient Hospital Stay (HOSPITAL_COMMUNITY)
Admission: AD | Admit: 2015-01-18 | Discharge: 2015-01-18 | Disposition: A | Discharge status: Home / Self Care | Payer: BLUE CROSS/BLUE SHIELD | Source: Ambulatory Visit | Attending: Obstetrics & Gynecology | Admitting: Obstetrics & Gynecology

## 2015-01-18 DIAGNOSIS — Z3A19 19 weeks gestation of pregnancy: Secondary | ICD-10-CM | POA: Insufficient documentation

## 2015-01-18 DIAGNOSIS — O98312 Other infections with a predominantly sexual mode of transmission complicating pregnancy, second trimester: Secondary | ICD-10-CM | POA: Insufficient documentation

## 2015-01-18 DIAGNOSIS — O99342 Other mental disorders complicating pregnancy, second trimester: Secondary | ICD-10-CM | POA: Diagnosis not present

## 2015-01-18 DIAGNOSIS — A568 Sexually transmitted chlamydial infection of other sites: Secondary | ICD-10-CM | POA: Insufficient documentation

## 2015-01-18 DIAGNOSIS — F319 Bipolar disorder, unspecified: Secondary | ICD-10-CM | POA: Diagnosis not present

## 2015-01-18 DIAGNOSIS — A749 Chlamydial infection, unspecified: Secondary | ICD-10-CM

## 2015-01-18 DIAGNOSIS — O26892 Other specified pregnancy related conditions, second trimester: Secondary | ICD-10-CM | POA: Diagnosis present

## 2015-01-18 HISTORY — DX: Anxiety disorder, unspecified: F41.9

## 2015-01-18 HISTORY — DX: Bipolar disorder, unspecified: F31.9

## 2015-01-18 HISTORY — DX: Major depressive disorder, single episode, unspecified: F32.9

## 2015-01-18 HISTORY — DX: Chlamydial infection, unspecified: A74.9

## 2015-01-18 HISTORY — DX: Depression, unspecified: F32.A

## 2015-01-18 MED ORDER — AZITHROMYCIN 250 MG PO TABS
1000.0000 mg | ORAL_TABLET | Freq: Once | ORAL | Status: AC
  Administered 2015-01-18: 1000 mg via ORAL
  Filled 2015-01-18: qty 4

## 2015-01-18 NOTE — MAU Provider Note (Signed)
History     CSN: [id number]  Arrival date and time: 01/18/15 1749   First Provider Initiated Contact with Patient 01/18/15 1823      No chief complaint on file.  HPI  [name] is a 19 y.o. G1P0 at [redacted]w[redacted]d who presents to MAU today, although she is unsure why she is here. She has discharge paperwork from [facility] from 01/15/15 indicating that she should follow-up with [facility] for prenatal care. There is also documentation that she was called about a + chlamydia infection from the tests run on 01/15/15, however patient seems to be unaware of the infection. She had [place] that day that showed SIUP at ~ [redacted] weeks gestation. She denies abdominal pain, vaginal bleeding or fever today. She does continue to have a moderate amount of vaginal discharge. She is Bipolar and does not appear to be taking her medications.  OB History    Gravida Para Term Preterm AB TAB SAB Ectopic Multiple Living   1         0      Past Medical History  Diagnosis Date  . Chlamydia infection   . Anxiety   . Depression   . Bipolar 1 disorder [facility])     Past Surgical History  Procedure Laterality Date  . No past surgeries      History reviewed. No pertinent family history.  Social History  Substance Use Topics  . Smoking status: Never Smoker   . Smokeless tobacco: None  . Alcohol Use: 0.6 oz/week    1 Shots of liquor per week    Allergies:  Allergies  Allergen Reactions  . Food     Bananas-throat swells/shortness of breath  . Lactose Intolerance (Gi) Nausea And Vomiting    Prescriptions prior to admission  Medication Sig Dispense Refill Last Dose  . FLUoxetine (PROZAC) 10 MG capsule Take 1 capsule (10 mg total) by mouth daily. (Patient not taking: Reported on 01/15/2015) 30 capsule 0 Not Taking at Unknown time  . potassium chloride SA (K-DUR,KLOR-CON) 20 MEQ tablet Take 1 tablet (20 mEq total) by mouth 2 (two) times daily. (Patient not taking: Reported on 01/15/2015) 5 tablet 0 Not Taking at Unknown  time  . Prenatal Vit-Fe Fumarate-FA (PRENATAL MULTIVITAMIN) TABS tablet Take 1 tablet by mouth daily at 12 noon.   01/14/2015 at Unknown time  . traZODone (DESYREL) 50 MG tablet Take 1 tablet (50 mg total) by mouth at bedtime. (Patient not taking: Reported on 01/15/2015) 30 tablet 0 Not Taking at Unknown time    Review of Systems  Constitutional: Negative for fever and malaise/fatigue.  Gastrointestinal: Negative for nausea, vomiting, abdominal pain, diarrhea and constipation.  Genitourinary: Negative for dysuria, urgency and frequency.       + vaginal discharge Neg - vaginal bleeding   Physical Exam   Blood pressure 116/72, pulse 95, temperature 98.6 F (37 C), temperature source Oral, resp. rate 18, last menstrual period 09/04/2014, SpO2 100 %.  Physical Exam  Nursing note and vitals reviewed. Constitutional: She is oriented to person, place, and time. She appears well-developed and well-nourished. No distress.  HENT:  Head: Normocephalic and atraumatic.  Cardiovascular: Normal rate.   Respiratory: Effort normal.  GI: Soft. She exhibits no distension and no mass. There is no tenderness. There is no rebound and no guarding.  Neurological: She is alert and oriented to person, place, and time.  Skin: Skin is warm and dry. No erythema.  Psychiatric: She has a normal mood and affect.  MAU Course  Procedures None  MDM + Chlamydia from recent visit in [facility] Treated in MAU with 1 G Zithromax  Assessment and Plan  A: SIUP at [redacted]w[redacted]d Chlamydia infection  P: Discharge home Treated in MAU with Zithromax Partner treatment advised Second trimester precautions discussed Patient advised to follow-up with [facility] on 01/24/15 at 1:00 pm to start prenatal care Pregnancy confirmation letter given Patient may return to MAU as needed or if her condition were to change or worsen  Luvenia Redden, PA-C  01/18/2015, 6:59 PM

## 2015-01-18 NOTE — Telephone Encounter (Signed)
Chart handoff to EDP for treatment plan for + chlamydia 

## 2015-01-18 NOTE — Discharge Instructions (Signed)
Chlamydia, Female Chlamydia is an infection. It is spread from one person to another person during sexual contact. This infection can be in the cervix, urine tube (urethra), throat, or bottom (rectum). This infection needs treatment. HOME CARE   Take your medicines (antibiotics) as told. Finish them even if you start to feel better.  Only take medicine as told by your doctor.  Tell your sex partner(s) that you have chlamydia. They must also be treated.  Do not have sex until your doctor says it is okay.  Rest.  Eat healthy. Drink enough fluids to keep your pee (urine) clear or pale yellow.  Keep all doctor visits as told. GET HELP IF:  You have pain when you pee.  You have belly pain.  You have vaginal discharge.  You have pain during sex.  You have bleeding between periods and after sex.  You have a fever. GET HELP RIGHT AWAY IF:   You feel sick to your stomach (nauseous) or you throw up (vomit).  You sweat much more than normal (diaphoresis).  You have trouble swallowing.   This information is not intended to replace advice given to you by your health care provider. Make sure you discuss any questions you have with your health care provider.   Document Released: 11/22/2007 Document Revised: 11/03/2014 Document Reviewed: 10/20/2012 Elsevier Interactive Patient Education [employer].

## 2015-01-18 NOTE — MAU Note (Signed)
Pt was seen in ED on 01-15-15.  Was told to come to MAU for treatment of Chlamydia.

## 2015-01-19 ENCOUNTER — Telehealth (HOSPITAL_COMMUNITY): Payer: Self-pay

## 2015-01-19 NOTE — Telephone Encounter (Signed)
Pt returned call.  ID verified x 2.  Pt informed of dx and need for addl tx.  Pt [facility] and txd with 1,000 mg of Azithromycin po x 1 dose.  Pt instructed to notify partner for testing and tx and abstain from sex x 2 wks post tx.  [facility] form faxed

## 2015-01-19 NOTE — Telephone Encounter (Signed)
Chart reviewed by Dr Jerilynn Mages. Gentry "Azithromycin 1 gram po x 1"  Left VM requesting callback.

## 2015-01-24 ENCOUNTER — Ambulatory Visit (INDEPENDENT_AMBULATORY_CARE_PROVIDER_SITE_OTHER): Payer: BLUE CROSS/BLUE SHIELD | Admitting: Advanced Practice Midwife

## 2015-01-24 ENCOUNTER — Encounter: Payer: Self-pay | Admitting: Advanced Practice Midwife

## 2015-01-24 VITALS — BP 119/80 | HR 82 | Temp 97.6°F | Wt 131.1 lb

## 2015-01-24 DIAGNOSIS — O0972 Supervision of high risk pregnancy due to social problems, second trimester: Secondary | ICD-10-CM

## 2015-01-24 DIAGNOSIS — O99342 Other mental disorders complicating pregnancy, second trimester: Secondary | ICD-10-CM

## 2015-01-24 DIAGNOSIS — A749 Chlamydial infection, unspecified: Secondary | ICD-10-CM | POA: Diagnosis not present

## 2015-01-24 DIAGNOSIS — F333 Major depressive disorder, recurrent, severe with psychotic symptoms: Secondary | ICD-10-CM

## 2015-01-24 DIAGNOSIS — F419 Anxiety disorder, unspecified: Secondary | ICD-10-CM | POA: Insufficient documentation

## 2015-01-24 DIAGNOSIS — Z124 Encounter for screening for malignant neoplasm of cervix: Secondary | ICD-10-CM

## 2015-01-24 DIAGNOSIS — O98319 Other infections with a predominantly sexual mode of transmission complicating pregnancy, unspecified trimester: Secondary | ICD-10-CM

## 2015-01-24 DIAGNOSIS — O99340 Other mental disorders complicating pregnancy, unspecified trimester: Secondary | ICD-10-CM

## 2015-01-24 DIAGNOSIS — O98819 Other maternal infectious and parasitic diseases complicating pregnancy, unspecified trimester: Secondary | ICD-10-CM

## 2015-01-24 DIAGNOSIS — O0970 Supervision of high risk pregnancy due to social problems, unspecified trimester: Secondary | ICD-10-CM | POA: Insufficient documentation

## 2015-01-24 LAB — POCT URINALYSIS DIP (DEVICE)
Bilirubin Urine: NEGATIVE
GLUCOSE, UA: NEGATIVE mg/dL
Hgb urine dipstick: NEGATIVE
Ketones, ur: NEGATIVE mg/dL
Nitrite: NEGATIVE
PH: 7 (ref 5.0–8.0)
Protein, ur: NEGATIVE mg/dL
Specific Gravity, Urine: 1.02 (ref 1.005–1.030)
UROBILINOGEN UA: 0.2 mg/dL (ref 0.0–1.0)

## 2015-01-24 MED ORDER — HYDROXYZINE PAMOATE 25 MG PO CAPS: 25.0000 mg | ORAL_CAPSULE | Freq: Three times a day (TID) | ORAL | Status: DC | PRN

## 2015-01-24 NOTE — Patient Instructions (Signed)
Second Trimester of Pregnancy The second trimester is from week 13 through week 28, month 4 through 6. This is often the time in pregnancy that you feel your best. Often times, morning sickness has lessened or quit. You may have more energy, and you may get hungry more often. Your unborn baby (fetus) is growing rapidly. At the end of the sixth month, he or she is about 9 inches long and weighs about 1 pounds. You will likely feel the baby move (quickening) between 18 and 20 weeks of pregnancy. HOME CARE   Avoid all smoking, herbs, and alcohol. Avoid drugs not approved by your doctor.  Do not use any tobacco products, including cigarettes, chewing tobacco, and electronic cigarettes. If you need help quitting, ask your doctor. You may get counseling or other support to help you quit.  Only take medicine as told by your doctor. Some medicines are safe and some are not during pregnancy.  Exercise only as told by your doctor. Stop exercising if you start having cramps.  Eat regular, healthy meals.  Wear a good support bra if your breasts are tender.  Do not use hot tubs, steam rooms, or saunas.  Wear your seat belt when driving.  Avoid raw meat, uncooked cheese, and liter boxes and soil used by cats.  Take your prenatal vitamins.  Take 1500-2000 milligrams of calcium daily starting at the 20th week of pregnancy until you deliver your baby.  Try taking medicine that helps you poop (stool softener) as needed, and if your doctor approves. Eat more fiber by eating fresh fruit, vegetables, and whole grains. Drink enough fluids to keep your pee (urine) clear or pale yellow.  Take warm water baths (sitz baths) to soothe pain or discomfort caused by hemorrhoids. Use hemorrhoid cream if your doctor approves.  If you have puffy, bulging veins (varicose veins), wear support hose. Raise (elevate) your feet for 15 minutes, 3-4 times a day. Limit salt in your diet.  Avoid heavy lifting, wear low heals,  and sit up straight.  Rest with your legs raised if you have leg cramps or low back pain.  Visit your dentist if you have not gone during your pregnancy. Use a soft toothbrush to brush your teeth. Be gentle when you floss.  You can have sex (intercourse) unless your doctor tells you not to.  Go to your doctor visits. GET HELP IF:   You feel dizzy.  You have mild cramps or pressure in your lower belly (abdomen).  You have a nagging pain in your belly area.  You continue to feel sick to your stomach (nauseous), throw up (vomit), or have watery poop (diarrhea).  You have bad smelling fluid coming from your vagina.  You have pain with peeing (urination). GET HELP RIGHT AWAY IF:   You have a fever.  You are leaking fluid from your vagina.  You have spotting or bleeding from your vagina.  You have severe belly cramping or pain.  You lose or gain weight rapidly.  You have trouble catching your breath and have chest pain.  You notice sudden or extreme puffiness (swelling) of your face, hands, ankles, feet, or legs.  You have not felt the baby move in over an hour.  You have severe headaches that do not go away with medicine.  You have vision changes.   This information is not intended to replace advice given to you by your health care provider. Make sure you discuss any questions you have with your  health care provider.   Document Released: 05/09/2009 Document Revised: 03/05/2014 Document Reviewed: 04/15/2012 Elsevier Interactive Patient Education 2016 [employer].  Expedited Partner Therapy:  Information Sheet for Patients and Partners               You have been offered expedited partner therapy (EPT). This information sheet contains important information and warnings you need to be aware of, so please read it carefully.   Expedited Partner Therapy (EPT) is the clinical practice of treating the sexual partners of persons who receive chlamydia, gonorrhea, or  trichomoniasis diagnoses by providing medications or prescriptions to the patient. Patients then provide partners with these therapies without the health-care provider having examined the partner. In other words, EPT is a convenient, fast and private way for patients to help their sexual partners get treated.   Chlamydia and gonorrhea are bacterial infections you get from having sex with a person who is already infected. Trichomoniasis (or "trich") is a very common sexually transmitted infection (STI) that is caused by infection with a protozoan parasite called Trichomonas vaginalis.  Many people with these infections don't know it because they feel fine, but without treatment these infections can cause serious health problems, such as pelvic inflammatory disease, ectopic pregnancy, infertility and increased risk of HIV.   It is important to get treated as soon as possible to protect your health, to avoid spreading these infections to others, and to prevent yourself from becoming re-infected. The good news is these infections can be easily cured with proper antibiotic medicine. The best way to take care of your self is to see a doctor or go to your local health department. If you are not able to see a doctor or other medical provider, you should take EPT.    Recommended Medication: EPT for Chlamydia:  Azithromycin (Zithromax) 1 gram orally in a single dose EPT for Gonorrhea:  Cefixime (Suprax) 400 milligrams orally in a single dose PLUS azithromycin (Zithromax) 1 gram orally in a single dose EPT for Trichomoniasis:  Metronidazole (Flagyl) 2 grams orally in a single dose   These medicines are very safe. However, you should not take them if you have ever had an allergic reaction (like a rash) to any of these medicines: azithromycin (Zithromax), erythromycin, clarithromycin (Biaxin), metronidazole (Flagyl), tinidazole (Tindimax). If you are uncertain about whether you have an allergy, call your medical  provider or pharmacist before taking this medicine. If you have a serious, long-term illness like kidney, liver or heart disease, colitis or stomach problems, or you are currently taking other prescription medication, talk to your provider before taking this medication.   Women: If you have lower belly pain, pain during sex, vomiting, or a fever, do not take this medicine. Instead, you should see a medical provider to be certain you do not have pelvic inflammatory disease (PID). PID can be serious and lead to infertility, pregnancy problems or chronic pelvic pain.   Pregnant Women: It is very important for you to see a doctor to get pregnancy services and pre-natal care. These antibiotics for EPT are safe for pregnant women, but you still need to see a medical provider as soon as possible. It is also important to note that Doxycycline is an alternative therapy for chlamydia, but it should not be taken by someone who is pregnant.   Men: If you have pain or swelling in the testicles or a fever, do not take this medicine and see a medical provider.     Men who  have sex with men (MSM): MSM in [place] continue to experience high rates of syphilis and HIV. Many MSM with gonorrhea or chlamydia could also have syphilis and/or HIV and not know it. If you are a man who has sex with other men, it is very important that you see a medical provider and are tested for HIV and syphilis. EPT is not recommended for gonorrhea for MSM.  Recommended treatment for gonorrhea for MSM is Rocephin (shot) AND azithromycin due to decreased cure rate.  Please see your medical provider if this is the case.    Along with this information sheet is a prescription for the medicine. If you receive a prescription it will be in your name and will indicate your date of birth, or it will be in the name of "Expedited Partner Therapy".   In either case, you can have the prescription filled at a pharmacy. You will be responsible for the  cost of the medicine, unless you have prescription drug coverage. In that case, you could provide your name so the pharmacy could bill your health plan.   Take the medication as directed. Some people will have a mild, upset stomach, which does not last long. AVOID alcohol 24 hours after taking metronidazole (Flagyl) to reduce the possibility of a disulfiram-like reaction (severe vomiting and abdominal pain).  After taking the medicine, do not have sex for 7 days. Do not share this medicine or give it to anyone else. It is important to tell everyone you have had sex with in the last 60 days that they need to go and get tested for sexually transmitted infections.   Ways to prevent these and other sexually transmitted infections (STIs):   Marland Kitchen Abstain from sex. This is the only sure way to avoid getting an STI.  Marland Kitchen Use barrier methods, such as condoms, consistently and correctly.  . Limit the number of sexual partners.  . Have regular physical exams, including testing for STIs.   For more information about EPT or other issues pertaining to an STI, please contact your medical provider or the [facility] Department at [phone] or [email or web address]
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
## 2015-01-24 NOTE — Progress Notes (Signed)
Initial prenatal education packet given Breastfeeding tip of the week reviewed Initial prenatal labs today Flu declined 

## 2015-01-25 LAB — PRENATAL PROFILE (SOLSTAS)
Antibody Screen: NEGATIVE
BASOS PCT: 0 % (ref 0–1)
Basophils Absolute: 0 10*3/uL (ref 0.0–0.1)
Eosinophils Absolute: 0.1 10*3/uL (ref 0.0–0.7)
Eosinophils Relative: 1 % (ref 0–5)
HCT: 33.7 % — ABNORMAL LOW (ref 36.0–46.0)
HEMOGLOBIN: 11.3 g/dL — AB (ref 12.0–15.0)
HEP B S AG: NEGATIVE
HIV: NONREACTIVE
LYMPHS ABS: 1.3 10*3/uL (ref 0.7–4.0)
LYMPHS PCT: 17 % (ref 12–46)
MCH: 30.5 pg (ref 26.0–34.0)
MCHC: 33.5 g/dL (ref 30.0–36.0)
MCV: 90.8 fL (ref 78.0–100.0)
MONO ABS: 0.4 10*3/uL (ref 0.1–1.0)
MONOS PCT: 6 % (ref 3–12)
MPV: 10.2 fL (ref 8.6–12.4)
NEUTROS ABS: 5.6 10*3/uL (ref 1.7–7.7)
NEUTROS PCT: 76 % (ref 43–77)
Platelets: 209 10*3/uL (ref 150–400)
RBC: 3.71 MIL/uL — ABNORMAL LOW (ref 3.87–5.11)
RDW: 13.5 % (ref 11.5–15.5)
RUBELLA: 1.02 {index} — AB (ref <0.90)
Rh Type: NEGATIVE
WBC: 7.4 10*3/uL (ref 4.0–10.5)

## 2015-01-25 LAB — CULTURE, OB URINE
Colony Count: NO GROWTH
Organism ID, Bacteria: NO GROWTH

## 2015-01-25 LAB — CYTOLOGY - PAP

## 2015-01-25 MED ORDER — AZITHROMYCIN 250 MG PO TABS
1000.0000 mg | ORAL_TABLET | Freq: Once | ORAL | Status: AC
  Administered 2015-01-24: 1000 mg via ORAL

## 2015-01-25 NOTE — Addendum Note (Signed)
Addended by: Riccardo Dubin on: 01/25/2015 12:39 PM   Modules accepted: Orders

## 2015-01-26 ENCOUNTER — Telehealth: Payer: Self-pay | Admitting: General Practice

## 2015-01-26 LAB — AFP, QUAD SCREEN
AFP: 52.7 ng/mL
Age Alone: 1:1150 {titer}
Curr Gest Age: 22 wks.days
HCG TOTAL: 19.65 [IU]/mL
INH: 155.4 pg/mL
INTERPRETATION-AFP: NEGATIVE
MOM FOR HCG: 0.91
MOM FOR INH: 0.59
MoM for AFP: 0.64
OPEN SPINA BIFIDA: NEGATIVE
Osb Risk: <1:27300 {titer}
TRI 18 SCR RISK EST: NEGATIVE
Trisomy 18 (Edward) Syndrome Interp.: 1:44800 {titer}
uE3 Mom: 1.09
uE3 Value: 2.67 ng/mL

## 2015-01-26 LAB — PRESCRIPTION MONITORING PROFILE (19 PANEL)
Amphetamine/Meth: NEGATIVE ng/mL
BARBITURATE SCREEN, URINE: NEGATIVE ng/mL
BENZODIAZEPINE SCREEN, URINE: NEGATIVE ng/mL
Buprenorphine, Urine: NEGATIVE ng/mL
CARISOPRODOL, URINE: NEGATIVE ng/mL
Cannabinoid Scrn, Ur: NEGATIVE ng/mL
Cocaine Metabolites: NEGATIVE ng/mL
Creatinine, Urine: 103 mg/dL (ref >20.0)
Fentanyl, Ur: NEGATIVE ng/mL
MDMA URINE: NEGATIVE ng/mL
Meperidine, Ur: NEGATIVE ng/mL
Methadone Screen, Urine: NEGATIVE ng/mL
Methaqualone: NEGATIVE ng/mL
NITRITES URINE, INITIAL: NEGATIVE ug/mL
OPIATE SCREEN, URINE: NEGATIVE ng/mL
OXYCODONE SCRN UR: NEGATIVE ng/mL
PROPOXYPHENE: NEGATIVE ng/mL
Phencyclidine, Ur: NEGATIVE ng/mL
TAPENTADOLUR: NEGATIVE ng/mL
TRAMADOL UR: NEGATIVE ng/mL
ZOLPIDEM, URINE: NEGATIVE ng/mL
pH, Initial: 7.4 pH (ref 4.5–8.9)

## 2015-01-26 NOTE — Telephone Encounter (Signed)
Per Lattie Haw need to call patient and provide her with information to see a physiatrist in the area. Patient has hx of suicide attempt & major depressive disorder. Called patient and provided her phone number & information for [place]. Patient verbalized understanding and had no questions

## 2015-01-27 LAB — CYSTIC FIBROSIS DIAGNOSTIC STUDY

## 2015-01-31 ENCOUNTER — Ambulatory Visit (HOSPITAL_COMMUNITY)
Admission: RE | Admit: 2015-01-31 | Discharge: 2015-01-31 | Disposition: A | Discharge status: Home / Self Care | Payer: BLUE CROSS/BLUE SHIELD | Source: Ambulatory Visit | Attending: Advanced Practice Midwife | Admitting: Advanced Practice Midwife

## 2015-01-31 DIAGNOSIS — Z36 Encounter for antenatal screening of mother: Secondary | ICD-10-CM | POA: Insufficient documentation

## 2015-01-31 DIAGNOSIS — O99342 Other mental disorders complicating pregnancy, second trimester: Secondary | ICD-10-CM | POA: Diagnosis not present

## 2015-01-31 DIAGNOSIS — Z3689 Encounter for other specified antenatal screening: Secondary | ICD-10-CM

## 2015-01-31 DIAGNOSIS — Z3A21 21 weeks gestation of pregnancy: Secondary | ICD-10-CM

## 2015-01-31 DIAGNOSIS — O0972 Supervision of high risk pregnancy due to social problems, second trimester: Secondary | ICD-10-CM

## 2015-02-01 ENCOUNTER — Other Ambulatory Visit: Payer: Self-pay | Admitting: Advanced Practice Midwife

## 2015-02-01 DIAGNOSIS — Z3689 Encounter for other specified antenatal screening: Secondary | ICD-10-CM

## 2015-02-01 DIAGNOSIS — Z3A21 21 weeks gestation of pregnancy: Secondary | ICD-10-CM

## 2015-02-22 ENCOUNTER — Ambulatory Visit (INDEPENDENT_AMBULATORY_CARE_PROVIDER_SITE_OTHER): Payer: BLUE CROSS/BLUE SHIELD | Admitting: Certified Nurse Midwife

## 2015-02-22 VITALS — BP 116/61 | HR 80 | Temp 98.7°F | Wt 132.0 lb

## 2015-02-22 DIAGNOSIS — O0972 Supervision of high risk pregnancy due to social problems, second trimester: Secondary | ICD-10-CM | POA: Diagnosis not present

## 2015-02-22 DIAGNOSIS — Z113 Encounter for screening for infections with a predominantly sexual mode of transmission: Secondary | ICD-10-CM

## 2015-02-22 LAB — POCT URINALYSIS DIP (DEVICE)
Bilirubin Urine: NEGATIVE
Glucose, UA: NEGATIVE mg/dL
Hgb urine dipstick: NEGATIVE
Ketones, ur: NEGATIVE mg/dL
Nitrite: NEGATIVE
Protein, ur: NEGATIVE mg/dL
Specific Gravity, Urine: 1.02 (ref 1.005–1.030)
Urobilinogen, UA: 0.2 mg/dL (ref 0.0–1.0)
pH: 7.5 (ref 5.0–8.0)

## 2015-02-22 MED ORDER — PRENATAL VITAMINS 0.8 MG PO TABS: 1.0000 | ORAL_TABLET | Freq: Every day | ORAL | Status: DC

## 2015-02-22 NOTE — Addendum Note (Signed)
Addended by: Shelly Coss on: 02/22/2015 03:16 PM   Modules accepted: Orders

## 2015-02-22 NOTE — Progress Notes (Signed)
Patient states that sometimes the baby doesn't move much. States she last felt the baby this morning.

## 2015-02-22 NOTE — Progress Notes (Signed)
Subjective:  Alice Castillo is a 19 y.o. G1P0 at [redacted]w[redacted]d being seen today for ongoing prenatal care.  She is currently monitored for the following issues for this low-risk pregnancy and has Major depressive disorder, recurrent, severe with psychotic features ([place]); Hallucinations; Severe recurrent major depressive disorder with psychotic features ([place]); PTSD (post-traumatic stress disorder); Supervision of high risk pregnancy due to social problems; and Anxiety during pregnancy, antepartum on her problem list.  Patient reports no complaints.  Contractions: Not present. Vag. Bleeding: None.  Movement: Present. Denies leaking of fluid.   The following portions of the patient's history were reviewed and updated as appropriate: allergies, current medications, past family history, past medical history, past social history, past surgical history and problem list. Problem list updated.  Objective:   Filed Vitals:   02/22/15 1450  BP: 116/61  Pulse: 80  Temp: 98.7 F (37.1 C)  Weight: 132 lb (59.875 kg)    Fetal Status: Fetal Heart Rate (bpm): 158   Movement: Present     General:  Alert, oriented and cooperative. Patient is in no acute distress.  Skin: Skin is warm and dry. No rash noted.   Cardiovascular: Normal heart rate noted  Respiratory: Normal respiratory effort, no problems with respiration noted  Abdomen: Soft, gravid, appropriate for gestational age. Pain/Pressure: Absent     Pelvic: Vag. Bleeding: None Vag D/C Character: White   Cervical exam deferred        Extremities: Normal range of motion.  Edema: None  Mental Status: Normal mood and affect. Normal behavior. Normal judgment and thought content.   Urinalysis: Urine Protein: Negative Urine Glucose: Negative  Assessment and Plan:  Pregnancy: G1P0 at [redacted]w[redacted]d  1. Supervision of high risk pregnancy due to social problems, second trimester  - Prenatal Multivit-Min-Fe-FA (PRENATAL VITAMINS) 0.8 MG tablet; Take 1 tablet by mouth daily.   Dispense: 30 tablet; Refill: 12 - GC/chlamydia probe amp, urine TOC  Preterm labor symptoms and general obstetric precautions including but not limited to vaginal bleeding, contractions, leaking of fluid and fetal movement were reviewed in detail with the patient. Please refer to After Visit Summary for other counseling recommendations.  Return in about 4 weeks (around 03/22/2015) for 1 hour gtt.   [name], CNM

## 2015-02-22 NOTE — Patient Instructions (Addendum)
Fetal Movement Counts Patient Name: __________________________________________________ Patient Due Date: ____________________ Performing a fetal movement count is highly recommended in high-risk pregnancies, but it is good for every pregnant woman to do. Your health care provider may ask you to start counting fetal movements at 28 weeks of the pregnancy. Fetal movements often increase:  After eating a full meal.  After physical activity.  After eating or drinking something sweet or cold.  At rest. Pay attention to when you feel the baby is most active. This will help you notice a pattern of your baby's sleep and wake cycles and what factors contribute to an increase in fetal movement. It is important to perform a fetal movement count at the same time each day when your baby is normally most active.  HOW TO COUNT FETAL MOVEMENTS  Find a quiet and comfortable area to sit or lie down on your left side. Lying on your left side provides the best blood and oxygen circulation to your baby.  Write down the day and time on a sheet of paper or in a journal.  Start counting kicks, flutters, swishes, rolls, or jabs in a 2-hour period. You should feel at least 10 movements within 2 hours.  If you do not feel 10 movements in 2 hours, wait 2-3 hours and count again. Look for a change in the pattern or not enough counts in 2 hours. SEEK MEDICAL CARE IF:  You feel less than 10 counts in 2 hours, tried twice.  There is no movement in over an hour.  The pattern is changing or taking longer each day to reach 10 counts in 2 hours.  You feel the baby is not moving as he or she usually does. Date: ____________ Movements: ____________ Start time: ____________ Alice Castillo time: ____________  Date: ____________ Movements: ____________ Start time: ____________ Alice Castillo time: ____________ Date: ____________ Movements: ____________ Start time: ____________ Alice Castillo time: ____________ Date: ____________ Movements:  ____________ Start time: ____________ Alice Castillo time: ____________ Date: ____________ Movements: ____________ Start time: ____________ Alice Castillo time: ____________ Date: ____________ Movements: ____________ Start time: ____________ Alice Castillo time: ____________ Date: ____________ Movements: ____________ Start time: ____________ Alice Castillo time: ____________ Date: ____________ Movements: ____________ Start time: ____________ Alice Castillo time: ____________  Date: ____________ Movements: ____________ Start time: ____________ Alice Castillo time: ____________ Date: ____________ Movements: ____________ Start time: ____________ Alice Castillo time: ____________ Date: ____________ Movements: ____________ Start time: ____________ Alice Castillo time: ____________ Date: ____________ Movements: ____________ Start time: ____________ Alice Castillo time: ____________ Date: ____________ Movements: ____________ Start time: ____________ Alice Castillo time: ____________ Date: ____________ Movements: ____________ Start time: ____________ Alice Castillo time: ____________ Date: ____________ Movements: ____________ Start time: ____________ Alice Castillo time: ____________  Date: ____________ Movements: ____________ Start time: ____________ Alice Castillo time: ____________ Date: ____________ Movements: ____________ Start time: ____________ Alice Castillo time: ____________ Date: ____________ Movements: ____________ Start time: ____________ Alice Castillo time: ____________ Date: ____________ Movements: ____________ Start time: ____________ Alice Castillo time: ____________ Date: ____________ Movements: ____________ Start time: ____________ Alice Castillo time: ____________ Date: ____________ Movements: ____________ Start time: ____________ Alice Castillo time: ____________ Date: ____________ Movements: ____________ Start time: ____________ Alice Castillo time: ____________  Date: ____________ Movements: ____________ Start time: ____________ Alice Castillo time: ____________ Date: ____________ Movements: ____________ Start time: ____________ Alice Castillo  time: ____________ Date: ____________ Movements: ____________ Start time: ____________ Alice Castillo time: ____________ Date: ____________ Movements: ____________ Start time: ____________ Alice Castillo time: ____________ Date: ____________ Movements: ____________ Start time: ____________ Alice Castillo time: ____________ Date: ____________ Movements: ____________ Start time: ____________ Alice Castillo time: ____________ Date: ____________ Movements: ____________ Start time: ____________ Alice Castillo time: ____________  Date: ____________ Movements: ____________ Start time: ____________ Alice Castillo  time: ____________ Date: ____________ Movements: ____________ Start time: ____________ Alice Castillo time: ____________ Date: ____________ Movements: ____________ Start time: ____________ Alice Castillo time: ____________ Date: ____________ Movements: ____________ Start time: ____________ Alice Castillo time: ____________ Date: ____________ Movements: ____________ Start time: ____________ Alice Castillo time: ____________ Date: ____________ Movements: ____________ Start time: ____________ Alice Castillo time: ____________ Date: ____________ Movements: ____________ Start time: ____________ Alice Castillo time: ____________  Date: ____________ Movements: ____________ Start time: ____________ Alice Castillo time: ____________ Date: ____________ Movements: ____________ Start time: ____________ Alice Castillo time: ____________ Date: ____________ Movements: ____________ Start time: ____________ Alice Castillo time: ____________ Date: ____________ Movements: ____________ Start time: ____________ Alice Castillo time: ____________ Date: ____________ Movements: ____________ Start time: ____________ Alice Castillo time: ____________ Date: ____________ Movements: ____________ Start time: ____________ Alice Castillo time: ____________ Date: ____________ Movements: ____________ Start time: ____________ Alice Castillo time: ____________  Date: ____________ Movements: ____________ Start time: ____________ Alice Castillo time: ____________ Date: ____________  Movements: ____________ Start time: ____________ Alice Castillo time: ____________ Date: ____________ Movements: ____________ Start time: ____________ Alice Castillo time: ____________ Date: ____________ Movements: ____________ Start time: ____________ Alice Castillo time: ____________ Date: ____________ Movements: ____________ Start time: ____________ Alice Castillo time: ____________ Date: ____________ Movements: ____________ Start time: ____________ Alice Castillo time: ____________ Date: ____________ Movements: ____________ Start time: ____________ Alice Castillo time: ____________  Date: ____________ Movements: ____________ Start time: ____________ Alice Castillo time: ____________ Date: ____________ Movements: ____________ Start time: ____________ Alice Castillo time: ____________ Date: ____________ Movements: ____________ Start time: ____________ Alice Castillo time: ____________ Date: ____________ Movements: ____________ Start time: ____________ Alice Castillo time: ____________ Date: ____________ Movements: ____________ Start time: ____________ Alice Castillo time: ____________ Date: ____________ Movements: ____________ Start time: ____________ Alice Castillo time: ____________   This information is not intended to replace advice given to you by your health care provider. Make sure you discuss any questions you have with your health care provider.   Document Released: 03/14/2006 Document Revised: 03/05/2014 Document Reviewed: 12/10/2011 Elsevier Interactive Patient Education 2016 [place]. Glucose Tolerance Test During Pregnancy The glucose tolerance test (GTT) is a blood test used to determine if you have developed a type of diabetes during pregnancy (gestational diabetes). This is when your body does not properly process sugar (glucose) in the food you eat, resulting in high blood glucose levels. Typically, a GTT is done after you have had a 1-hour glucose test with results that indicate you possibly have gestational diabetes. It may also be done if:  You have a history of  giving birth to very large babies or have experienced repeated fetal loss (stillbirth).   You have signs and symptoms of diabetes, such as:   Changes in your vision.   Tingling or numbness in your hands or feet.   Changes in hunger, thirst, and urination not otherwise explained by your pregnancy.  The GTT lasts about 3 hours. You will be given a sugar-water solution to drink at the beginning of the test. You will have blood drawn before you drink the solution and then again 1, 2, and 3 hours after you drink it. You will not be allowed to eat or drink anything else during the test. You must remain at the testing location to make sure that your blood is drawn on time. You should also avoid exercising during the test, because exercise can alter test results. PREPARATION FOR TEST  Eat normally for 3 days prior to the GTT test, including having plenty of carbohydrate-rich foods. Do not eat or drink anything except water during the final 12 hours before the test. In addition, your health care provider may ask you to stop taking certain medicines before the test. RESULTS  It  is your responsibility to obtain your test results. Ask the lab or department performing the test when and how you will get your results. Contact your health care provider to discuss any questions you have about your results.  Range of Normal Values Ranges for normal values may vary among different labs and hospitals. You should always check with your health care provider after having lab work or other tests done to discuss whether your values are considered within normal limits. Normal levels of blood glucose are as follows:  Fasting: less than 105 mg/dL.   1 hour after drinking the solution: less than 190 mg/dL.   2 hours after drinking the solution: less than 165 mg/dL.   3 hours after drinking the solution: less than 145 mg/dL.  Some substances can interfere with GTT results. These may include:  Blood pressure  and heart failure medicines, including beta blockers, furosemide, and thiazides.   Anti-inflammatory medicines, including aspirin.   Nicotine.   Some psychiatric medicines.  Meaning of Results Outside Normal Value Ranges GTT test results that are above normal values may indicate a number of health problems, such as:   Gestational diabetes.   Acute stress response.   Cushing syndrome.   Tumors such as pheochromocytoma or glucagonoma.   Long-term kidney problems.   Pancreatitis.   Hyperthyroidism.   Current infection.  Discuss your test results with your health care provider. He or she will use the results to make a diagnosis and determine a treatment plan that is right for you.   This information is not intended to replace advice given to you by your health care provider. Make sure you discuss any questions you have with your health care provider.   Document Released: 08/14/2011 Document Revised: 03/05/2014 Document Reviewed: 06/19/2013 Elsevier Interactive Patient Education [employer].

## 2015-02-26 LAB — GC/CHLAMYDIA PROBE AMP (~~LOC~~) NOT AT ARMC
Chlamydia: NEGATIVE
Neisseria Gonorrhea: NEGATIVE

## 2015-02-27 NOTE — L&D Delivery Note (Signed)
Delivery Note  Pt presented with painful contractions starting at 0400 and then subsequently had SROM at 1500 in the MAU. Her labor progressed quickly. At 8:04 PM a viable female was delivered via Vaginal, Spontaneous Delivery (Presentation: ; Occiput Anterior). Loose nuchal cord x 1.  APGAR: 8, 9; weight pending.   Placenta status: Intact, Spontaneous.  Cord: 3 vessels with the following complications: None.  Cord pH: n/a  Anesthesia: None  Episiotomy: None Lacerations: 3rd degree Suture Repair: 3.0 vicryl Est. Blood Loss (mL):  350  Mom to postpartum.  Baby to Couplet care / Skin to Skin. Lysteda 1300mg  po q8hrs started postpartum. Will need to continue for 5-7 days.  Berna Spare Mayo 06/07/2015, 8:36 PM  OB FELLOW DELIVERY ATTESTATION  CNM Yvonne Kendall was present for delivery. I performed the 3rd-degree repair.   Desma Maxim, MD 9:08 PM

## 2015-03-22 ENCOUNTER — Encounter: Payer: Self-pay | Admitting: Advanced Practice Midwife

## 2015-03-22 ENCOUNTER — Ambulatory Visit (INDEPENDENT_AMBULATORY_CARE_PROVIDER_SITE_OTHER): Payer: BLUE CROSS/BLUE SHIELD | Admitting: Advanced Practice Midwife

## 2015-03-22 VITALS — BP 107/66 | HR 88 | Temp 98.7°F | Wt 139.9 lb

## 2015-03-22 DIAGNOSIS — O360130 Maternal care for anti-D [Rh] antibodies, third trimester, not applicable or unspecified: Secondary | ICD-10-CM | POA: Diagnosis not present

## 2015-03-22 DIAGNOSIS — D681 Hereditary factor XI deficiency: Secondary | ICD-10-CM | POA: Insufficient documentation

## 2015-03-22 DIAGNOSIS — Z6791 Unspecified blood type, Rh negative: Secondary | ICD-10-CM | POA: Insufficient documentation

## 2015-03-22 DIAGNOSIS — O0973 Supervision of high risk pregnancy due to social problems, third trimester: Secondary | ICD-10-CM

## 2015-03-22 DIAGNOSIS — R202 Paresthesia of skin: Secondary | ICD-10-CM

## 2015-03-22 DIAGNOSIS — O26893 Other specified pregnancy related conditions, third trimester: Secondary | ICD-10-CM

## 2015-03-22 DIAGNOSIS — Z832 Family history of diseases of the blood and blood-forming organs and certain disorders involving the immune mechanism: Secondary | ICD-10-CM

## 2015-03-22 DIAGNOSIS — O26899 Other specified pregnancy related conditions, unspecified trimester: Secondary | ICD-10-CM

## 2015-03-22 LAB — CBC
HCT: 31.2 % — ABNORMAL LOW (ref 36.0–46.0)
Hemoglobin: 10.6 g/dL — ABNORMAL LOW (ref 12.0–15.0)
MCH: 31.2 pg (ref 26.0–34.0)
MCHC: 34 g/dL (ref 30.0–36.0)
MCV: 91.8 fL (ref 78.0–100.0)
MPV: 10.5 fL (ref 8.6–12.4)
PLATELETS: 184 10*3/uL (ref 150–400)
RBC: 3.4 MIL/uL — ABNORMAL LOW (ref 3.87–5.11)
RDW: 12.9 % (ref 11.5–15.5)
WBC: 7 10*3/uL (ref 4.0–10.5)

## 2015-03-22 LAB — POCT URINALYSIS DIP (DEVICE)
BILIRUBIN URINE: NEGATIVE
Glucose, UA: NEGATIVE mg/dL
KETONES UR: NEGATIVE mg/dL
NITRITE: NEGATIVE
PH: 6 (ref 5.0–8.0)
Protein, ur: NEGATIVE mg/dL
Specific Gravity, Urine: 1.025 (ref 1.005–1.030)
Urobilinogen, UA: 0.2 mg/dL (ref 0.0–1.0)

## 2015-03-22 MED ORDER — RHO D IMMUNE GLOBULIN 1500 UNIT/2ML IJ SOSY
300.0000 ug | PREFILLED_SYRINGE | Freq: Once | INTRAMUSCULAR | Status: AC
  Administered 2015-03-22: 300 ug via INTRAMUSCULAR

## 2015-03-22 NOTE — Progress Notes (Signed)
Subjective:  Alice Castillo is a 20 y.o. G1P0 at [redacted]w[redacted]d being seen today for ongoing prenatal care.  She is currently monitored for the following issues for this low-risk pregnancy and has Major depressive disorder, recurrent, severe with psychotic features ([place]); Hallucinations; Severe recurrent major depressive disorder with psychotic features ([place]); PTSD (post-traumatic stress disorder); Supervision of high risk pregnancy due to social problems; Anxiety during pregnancy, antepartum; and Rh negative state in antepartum period on her problem list.  Patient reports nausea and lightheadedness after drinking Glucola.  Contractions: Not present. Vag. Bleeding: None.  Movement: Present. Denies leaking of fluid.   The following portions of the patient's history were reviewed and updated as appropriate: allergies, current medications, past family history, past medical history, past social history, past surgical history and problem list. Problem list updated.  Objective:   Filed Vitals:   03/22/15 1249  BP: 107/66  Pulse: 88  Temp: 98.7 F (37.1 C)  Weight: 139 lb 14.4 oz (63.458 kg)    Fetal Status: Fetal Heart Rate (bpm): 142   Movement: Present     General:  Alert, oriented and cooperative. Patient is in no acute distress.  Skin: Skin is warm and dry. No rash noted.   Cardiovascular: Normal heart rate noted  Respiratory: Normal respiratory effort, no problems with respiration noted  Abdomen: Soft, gravid, appropriate for gestational age. Pain/Pressure: Present     Pelvic: Vag. Bleeding: None Vag D/C Character: White   Cervical exam deferred        Extremities: Normal range of motion.  Edema: Trace  Mental Status: Normal mood and affect. Normal behavior. Normal judgment and thought content.   Urinalysis:      Assessment and Plan:  Pregnancy: G1P0 at [redacted]w[redacted]d  1. Rh negative state in antepartum period, third trimester, not applicable or unspecified fetus     Rhophylac  2.  Family Hx of  Factor XI deficiency      Will check Factor XI today      Message sent to Dr Burnett Harry re: counseling and testing 3.  Glucola today   Preterm labor symptoms and general obstetric precautions including but not limited to vaginal bleeding, contractions, leaking of fluid and fetal movement were reviewed in detail with the patient. Please refer to After Visit Summary for other counseling recommendations.  Return in about 2 weeks (around 04/05/2015) for [facility].   Seabron Spates, CNM

## 2015-03-22 NOTE — Progress Notes (Signed)
C/o top of abdomen just under breasts  feels numb all the time.

## 2015-03-22 NOTE — Patient Instructions (Signed)
Rh0 [D] Immune Globulin injection What is this medicine? RhO [D] IMMUNE GLOBULIN (i MYOON GLOB yoo lin) is used to treat idiopathic thrombocytopenic purpura (ITP). This medicine is used in RhO negative mothers who are pregnant with a RhO positive child. It is also used after a transfusion of RhO positive blood into a RhO negative person. This medicine may be used for other purposes; ask your health care provider or pharmacist if you have questions. What should I tell my health care provider before I take this medicine? They need to know if you have any of these conditions: -bleeding disorders -low levels of immunoglobulin A in the body -no spleen -an unusual or allergic reaction to human immune globulin, other medicines, foods, dyes, or preservatives -pregnant or trying to get pregnant -breast-feeding How should I use this medicine? This medicine is for injection into a muscle or into a vein. It is given by a health care professional in a hospital or clinic setting. Talk to your pediatrician regarding the use of this medicine in children. This medicine is not approved for use in children. Overdosage: If you think you have taken too much of this medicine contact a poison control center or emergency room at once. NOTE: This medicine is only for you. Do not share this medicine with others. What if I miss a dose? It is important not to miss your dose. Call your doctor or health care professional if you are unable to keep an appointment. What may interact with this medicine? -live virus vaccines, like measles, mumps, or rubella This list may not describe all possible interactions. Give your health care provider a list of all the medicines, herbs, non-prescription drugs, or dietary supplements you use. Also tell them if you smoke, drink alcohol, or use illegal drugs. Some items may interact with your medicine. What should I watch for while using this medicine? This medicine is made from human blood.  It may be possible to pass an infection in this medicine. Talk to your doctor about the risks and benefits of this medicine. This medicine may interfere with live virus vaccines. Before you get live virus vaccines tell your health care professional if you have received this medicine within the past 3 months. What side effects may I notice from receiving this medicine? Side effects that you should report to your doctor or health care professional as soon as possible: -allergic reactions like skin rash, itching or hives, swelling of the face, lips, or tongue -breathing problems -chest pain or tightness -yellowing of the eyes or skin Side effects that usually do not require medical attention (report to your doctor or health care professional if they continue or are bothersome): -fever -pain and tenderness at site where injected This list may not describe all possible side effects. Call your doctor for medical advice about side effects. You may report side effects to FDA at 1-800-FDA-1088. Where should I keep my medicine? This drug is given in a hospital or clinic and will not be stored at home. NOTE: This sheet is a summary. It may not cover all possible information. If you have questions about this medicine, talk to your doctor, pharmacist, or health care provider.    2016, Elsevier/Gold Standard. (2007-10-13 14:06:10)

## 2015-03-22 NOTE — Progress Notes (Signed)
C/O numbness superficially at top of abdomen, below sternum across the top, about 2-3 inches down. No difficulty breathing. No deep pain or other issues.  Suspect this is a paresthesia from nerve compression. Advised on back stretches. If persists may need PT or ortho exam.

## 2015-03-23 LAB — HIV ANTIBODY (ROUTINE TESTING W REFLEX): HIV: NONREACTIVE

## 2015-03-24 LAB — RPR

## 2015-03-25 LAB — GLUCOSE TOLERANCE, 1 HOUR

## 2015-03-25 LAB — FACTOR 11 ASSAY: FACTOR XI ACTIVITY: 50 % — AB (ref 65–150)

## 2015-03-29 ENCOUNTER — Telehealth: Payer: Self-pay | Admitting: General Practice

## 2015-03-29 DIAGNOSIS — D67 Hereditary factor IX deficiency: Secondary | ICD-10-CM

## 2015-03-29 NOTE — Telephone Encounter (Signed)
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 Made appt with MFM for 2/7 @ 1pm before OB visit. Patient does not have insurance. Referral placed to [facility] on [place] referral coordinator and left message on voicemail to call us back. Called patient, no answer- left message stating we are trying to reach you with results & an appt, please call us back at the clinics

## 2015-03-29 NOTE — Telephone Encounter (Signed)
Pt returned call and I informed her the MFM appt scheduled for 04/05/15 @ 1p for genetic counseling for Factor IX.  I explained to pt that we are working on a Hematology/Oncology referral.  Pt stated understanding with no further questions.

## 2015-03-30 LAB — GLUCOSE TOLERANCE, 1 HOUR (50G) W/O FASTING: GLUCOSE 1 HOUR GTT: 115 mg/dL (ref 70–140)

## 2015-04-01 ENCOUNTER — Other Ambulatory Visit (HOSPITAL_COMMUNITY)
Admission: RE | Admit: 2015-04-01 | Discharge: 2015-04-01 | Disposition: A | Discharge status: Home / Self Care | Payer: BLUE CROSS/BLUE SHIELD | Source: Other Acute Inpatient Hospital | Attending: Hematology | Admitting: Hematology

## 2015-04-01 ENCOUNTER — Ambulatory Visit (HOSPITAL_BASED_OUTPATIENT_CLINIC_OR_DEPARTMENT_OTHER): Payer: BLUE CROSS/BLUE SHIELD | Admitting: Hematology

## 2015-04-01 ENCOUNTER — Telehealth: Payer: Self-pay | Admitting: Hematology

## 2015-04-01 ENCOUNTER — Ambulatory Visit (HOSPITAL_BASED_OUTPATIENT_CLINIC_OR_DEPARTMENT_OTHER): Payer: BLUE CROSS/BLUE SHIELD

## 2015-04-01 VITALS — BP 110/63 | HR 75 | Temp 98.0°F | Resp 18 | Ht 66.0 in | Wt 137.2 lb

## 2015-04-01 DIAGNOSIS — Z832 Family history of diseases of the blood and blood-forming organs and certain disorders involving the immune mechanism: Secondary | ICD-10-CM

## 2015-04-01 DIAGNOSIS — D699 Hemorrhagic condition, unspecified: Secondary | ICD-10-CM | POA: Insufficient documentation

## 2015-04-01 DIAGNOSIS — C801 Malignant (primary) neoplasm, unspecified: Secondary | ICD-10-CM | POA: Insufficient documentation

## 2015-04-01 DIAGNOSIS — O4693 Antepartum hemorrhage, unspecified, third trimester: Secondary | ICD-10-CM | POA: Diagnosis not present

## 2015-04-01 LAB — CBC & DIFF AND RETIC
BASO%: 0.2 % (ref 0.0–2.0)
BASOS ABS: 0 10*3/uL (ref 0.0–0.1)
EOS ABS: 0 10*3/uL (ref 0.0–0.5)
EOS%: 0.6 % (ref 0.0–7.0)
HEMATOCRIT: 31.5 % — AB (ref 34.8–46.6)
HEMOGLOBIN: 11 g/dL — AB (ref 11.6–15.9)
Immature Retic Fract: 7.3 % (ref 1.60–10.00)
LYMPH#: 1.3 10*3/uL (ref 0.9–3.3)
LYMPH%: 20.7 % (ref 14.0–49.7)
MCH: 31.5 pg (ref 25.1–34.0)
MCHC: 34.9 g/dL (ref 31.5–36.0)
MCV: 90.3 fL (ref 79.5–101.0)
MONO#: 0.5 10*3/uL (ref 0.1–0.9)
MONO%: 7.1 % (ref 0.0–14.0)
NEUT#: 4.5 10*3/uL (ref 1.5–6.5)
NEUT%: 71.4 % (ref 38.4–76.8)
Platelets: 147 10*3/uL (ref 145–400)
RBC: 3.49 10*6/uL — ABNORMAL LOW (ref 3.70–5.45)
RDW: 12.5 % (ref 11.2–14.5)
RETIC %: 1.7 % (ref 0.70–2.10)
RETIC CT ABS: 59.33 10*3/uL (ref 33.70–90.70)
WBC: 6.3 10*3/uL (ref 3.9–10.3)

## 2015-04-01 LAB — COMPREHENSIVE METABOLIC PANEL
ALK PHOS: 104 U/L (ref 40–150)
ALT: 13 U/L (ref 0–55)
AST: 14 U/L (ref 5–34)
Albumin: 3 g/dL — ABNORMAL LOW (ref 3.5–5.0)
Anion Gap: 10 mEq/L (ref 3–11)
BUN: 4.9 mg/dL — ABNORMAL LOW (ref 7.0–26.0)
CALCIUM: 9.1 mg/dL (ref 8.4–10.4)
CHLORIDE: 107 meq/L (ref 98–109)
CO2: 22 mEq/L (ref 22–29)
Creatinine: 0.6 mg/dL (ref 0.6–1.1)
EGFR: >90 mL/min/{1.73_m2} (ref >90)
Glucose: 77 mg/dl (ref 70–140)
Potassium: 3.5 mEq/L (ref 3.5–5.1)
SODIUM: 139 meq/L (ref 136–145)
Total Bilirubin: 0.42 mg/dL (ref 0.20–1.20)
Total Protein: 6.5 g/dL (ref 6.4–8.3)

## 2015-04-01 LAB — PLATELET FUNCTION ASSAY: Collagen / Epinephrine: 129 seconds (ref 0–193)

## 2015-04-01 NOTE — Progress Notes (Signed)
Marland Kitchen    HEMATOLOGY/ONCOLOGY CONSULTATION NOTE  Date of Service: 04/01/2015  OB Gyn: [name], CNM and [name], CNM CNM  CHIEF COMPLAINTS/PURPOSE OF CONSULTATION:  Family h/o of factor XI deficiency (Dad)   HISTORY OF PRESENTING ILLNESS:   Alice Castillo is a wonderful 20 y.o. female who has been referred to [place] from the [facility] clinic for evaluation and management of possible bleeding disorder.  Ms. Charter has a history of depression/anxiety and a family history of factor XI deficiency in her father and paternal uncle Nicole Kindred.  She is here for an evaluation to see if she might have a factor XI deficiency or a related bleeding disorder.  She notes that she has been apart from her father for a while but does note that he had a tendency to bleed excessively with any minor injuries.  He is unable to provide much additional help.  She does report that she is aware that he was probably deficient in factor XI but isn't able to provide much additional detail Re: Extent of bleeding or factor levels.  She also reports that her paternal uncle Nicole Kindred was diagnosed with a similar disorder and had a significant bleeding tendency and had to leave the armed forces.  Patient notes that she is currently [redacted] weeks pregnant  And since this question arose was sent to [place] for further evaluation.  Patient notes that she never has personally noted any issues with significant bleeding. She notes some increased bruisability. No epistaxis, no gum bleeds, no joint bleeds no menorrhagia no CNS bleeds, no muscular hematomas. He has never had dental extractions or surgeries in the past to determine increased bleeding tendency.  Notes that she has never had a knee pregnancies previously to determine issues with postpartum hemorrhage.  She indicates her willingness to be tested to figure out if she has any bleeding tendencies. No other issues at this time.  She has not been on any blood thinners or NSAIDs  recently.  MEDICAL HISTORY:  Past Medical History  Diagnosis Date  . Chlamydia infection   . Anxiety   . Depression   . Bipolar 1 disorder ([place])     SURGICAL HISTORY: Past Surgical History  Procedure Laterality Date  . No past surgeries      SOCIAL HISTORY: Social History   Social History  . Marital Status: Single    Spouse Name: N/A  . Number of Children: N/A  . Years of Education: N/A   Occupational History  . Not on file.   Social History Main Topics  . Smoking status: Never Smoker   . Smokeless tobacco: Never Used  . Alcohol Use: 0.6 oz/week    1 Shots of liquor per week     Comment: stopped when found out pregnant  . Drug Use: No  . Sexual Activity: Not Currently    Birth Control/ Protection: None   Other Topics Concern  . Not on file   Social History Narrative    FAMILY HISTORY: Family History  Problem Relation Age of Onset  . Clotting disorder Father   . Heart disease Maternal Uncle   . Diabetes Maternal Uncle   . Heart disease Paternal Uncle   . Diabetes Paternal Uncle     ALLERGIES:  is allergic to food and lactose intolerance (gi).  MEDICATIONS:  Current Outpatient Prescriptions  Medication Sig Dispense Refill  . potassium chloride SA (K-DUR,KLOR-CON) 20 MEQ tablet Take 1 tablet (20 mEq total) by mouth 2 (two) times daily.  5 tablet 0  . Prenatal Multivit-Min-Fe-FA (PRENATAL VITAMINS) 0.8 MG tablet Take 1 tablet by mouth daily. 30 tablet 12   No current facility-administered medications for this visit.    REVIEW OF SYSTEMS:    10 Point review of Systems was done is negative except as noted above.  PHYSICAL EXAMINATION: ECOG PERFORMANCE STATUS: 0 - Asymptomatic  . Filed Vitals:   04/01/15 0957  BP: 110/63  Pulse: 75  Temp: 98 F (36.7 C)  Resp: 18   Filed Weights   04/01/15 0957  Weight: 137 lb 3.2 oz (62.234 kg)   .Body mass index is 22.16 kg/(m^2).  GENERAL:alert, in no acute distress and comfortable SKIN: skin color,  texture, turgor are normal, no rashes or significant lesions EYES: normal, conjunctiva are pink and non-injected, sclera clear OROPHARYNX:no exudate, no erythema and lips, buccal mucosa, and tongue normal  NECK: supple, no JVD, thyroid normal size, non-tender, without nodularity LYMPH:  no palpable lymphadenopathy in the cervical, axillary or inguinal LUNGS: clear to auscultation with normal respiratory effort HEART: regular rate & rhythm,  no murmurs and no lower extremity edema ABDOMEN: abdomen soft, non-tender, normoactive bowel sounds  Musculoskeletal: no cyanosis of digits and no clubbing  PSYCH: alert & oriented x 3 with fluent speech NEURO: no focal motor/sensory deficits  LABORATORY DATA:  I have reviewed the data as listed  . CBC Latest Ref Rng 04/01/2015 03/22/2015 01/24/2015  WBC 3.9 - 10.3 10e3/uL 6.3 7.0 7.4  Hemoglobin 11.6 - 15.9 g/dL 11.0(L) 10.6(L) 11.3(L)  Hematocrit 34.8 - 46.6 % 31.5(L) 31.2(L) 33.7(L)  Platelets 145 - 400 10e3/uL 147 184 209   . CBC    Component Value Date/Time   WBC 6.3 04/01/2015 1128   WBC 7.0 03/22/2015 1350   RBC 3.49* 04/01/2015 1128   RBC 3.40* 03/22/2015 1350   HGB 11.0* 04/01/2015 1128   HGB 10.6* 03/22/2015 1350   HCT 31.5* 04/01/2015 1128   HCT 31.2* 03/22/2015 1350   PLT 147 04/01/2015 1128   PLT 184 03/22/2015 1350   MCV 90.3 04/01/2015 1128   MCV 91.8 03/22/2015 1350   MCH 31.5 04/01/2015 1128   MCH 31.2 03/22/2015 1350   MCHC 34.9 04/01/2015 1128   MCHC 34.0 03/22/2015 1350   RDW 12.5 04/01/2015 1128   RDW 12.9 03/22/2015 1350   LYMPHSABS 1.3 04/01/2015 1128   LYMPHSABS 1.3 01/24/2015 1632   MONOABS 0.5 04/01/2015 1128   MONOABS 0.4 01/24/2015 1632   EOSABS 0.0 04/01/2015 1128   EOSABS 0.1 01/24/2015 1632   BASOSABS 0.0 04/01/2015 1128   BASOSABS 0.0 01/24/2015 1632   . CMP Latest Ref Rng 04/01/2015 01/15/2015 09/18/2014  Glucose 70 - 140 mg/dl 77 86 92  BUN 7.0 - 26.0 mg/dL 4.9(L) <5(L) 9  Creatinine 0.6 - 1.1  mg/dL 0.6 0.45 0.60  Sodium 136 - 145 mEq/L 139 135 139  Potassium 3.5 - 5.1 mEq/L 3.5 3.4(L) 3.3(L)  Chloride 101 - 111 mmol/L - 104 108  CO2 22 - 29 mEq/L 22 22 23   Calcium 8.4 - 10.4 mg/dL 9.1 9.2 9.0  Total Protein 6.4 - 8.3 g/dL 6.5 6.5 7.8  Total Bilirubin 0.20 - 1.20 mg/dL 0.42 0.3 0.6  Alkaline Phos 40 - 150 U/L 104 52 66  AST 5 - 34 U/L 14 16 19   ALT 0 - 55 U/L 13 14 20    Component     Latest Ref Rng 04/01/2015  Factor VIII Activity     57 - 163 % 163  von Willebrand Factor (vWF) Ag     50 - 200 % 167  vWF Activity     50 - 200 % 142  Interpretation      Note  PDF Image      .  INR     0.8 - 1.2 1.2  Prothrombin Time     9.1 - 12.0 sec 11.9  PFA Interpretation        Collagen / Epinephrine     0 - 193 seconds 129  APTT     24 - 33 sec 31  Factor XI Activity     60 - 150 % 39 (L)    RADIOGRAPHIC STUDIES: I have personally reviewed the radiological images as listed and agreed with the findings in the report. No results found.  ASSESSMENT & PLAN:   20 year old Caucasian female with a family history of possible factor XI deficiency in her father and paternal uncle.  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 Normal PTT, PT, 1 mg panel and platelet function assay. Patient has not had any issues with significant spontaneous bleeding.  This was worked would typically be expected given her situation. She has had some minimal tendency for increased bruisability. She has not had any surgical or procedure of hemostatic challenges in the past and determine risk of bleeding from her upcoming parturition.  Typically factor XI plays a role in generating additional thrombin and regulating fibrinolysis.  Deficiency of this factor especially mild does not usually cause spontaneous bleeding but might cause increased risk of bleeding  with surgical trauma and injury especially in the areas of inherently increased fibrinolytic activity such as the nose, oral cavity and genito-urinary tract.  Rarely co- inheritance of other coagulation deficiencies including VWD, FVIII and FVII have been noted that the patient's testing results not suggest this.  In this patient who is [redacted] weeks pregnant she might have an increased risk of postpartum hemorrhage (the risk is about 17-30% with severe def FXI<15% probably lower in those with partial deficiency) and potentially increased risk of bleeding after a C-section (20-50% risk of excessive bleeding) The risk of bleeding does not clearly correlate with plasma factor XI levels and might be difficult to predict.  NSAIDS certainly would be an additional concern compounding the risk of bleeding and should be strongly avoided. Plan -patient has an appointment to see the genetic counsellor on 04/05/2015 -No role for routine prophylaxis for daily activities in the absence of bleeding. -Would absolutely avoid any NSAIDs. -epidural anesthesia cannot be recommended without complete replacement of factor XI and might carry an increased risk of spinal hematoma.  -given the presence of only partial deficiency would not recommend prophylactic factor XI replacement via FFP for routine vaginal delivery.  However would recommend and antifibrinolytics for 5-7 days post-partum starting immediately after delivery to reduce the risk of postpartum hemorrhage. If any time however the patient develops uncontrollable postpartum hemorrhage in the absence of an obvious alternative etiology (uterine atony/retained POC etc) would need to have FFP replacement for factor XI correction.  -If the patient needs to go for a C-section would recommend prophylactic and maintenance replacement. 10-20 ml/kg of FFP starting pre/intra-operatively and then 5-94ml/kg q24h post-operative for 3 doses. (longer if evidence of significant  surgical bleeding) + 1000 mg po 4 times daily for 10-14 days   RTC with Dr Irene Limbo in  2 weeks for discussion of study results  All of the patients questions were answered with apparent satisfaction. The patient knows to call the clinic with any problems, questions or concerns.  I spent 60 minutes counseling the patient face to face. The total time spent in the appointment was 70 minutes and more than 50% was on counseling and direct patient cares.    Sullivan Lone MD [place] AAHIVMS [facility] Hematology/Oncology Physician [facility]  (Office):       [phone] (Work cell):  [phone] (Fax):           [phone]  04/01/2015 10:45 AM

## 2015-04-01 NOTE — Telephone Encounter (Signed)
Pt confirmed labs/ov per 02/03 POF, gave pt AVS and Calendar... KJ

## 2015-04-02 LAB — PROTHROMBIN TIME (PT)
INR: 1.2 (ref 0.8–1.2)
Prothrombin Time: 11.9 s (ref 9.1–12.0)

## 2015-04-02 LAB — APTT: aPTT: 31 s (ref 24–33)

## 2015-04-04 LAB — PLATELET FUNCTION ASSAY

## 2015-04-04 LAB — VON WILLEBRAND PANEL
Factor VIII Activity: 163 % (ref 57–163)
PDF Image: 0
VON WILLEBRAND FACTOR: 142 % (ref 50–200)
von Willebrand Factor (vWF) Ag: 167 % (ref 50–200)

## 2015-04-04 LAB — FACTOR 11 ASSAY: Factor XI Activity: 39 % — ABNORMAL LOW (ref 60–150)

## 2015-04-05 ENCOUNTER — Ambulatory Visit (INDEPENDENT_AMBULATORY_CARE_PROVIDER_SITE_OTHER): Payer: BLUE CROSS/BLUE SHIELD | Admitting: Advanced Practice Midwife

## 2015-04-05 ENCOUNTER — Ambulatory Visit (HOSPITAL_COMMUNITY)
Admission: RE | Admit: 2015-04-05 | Discharge: 2015-04-05 | Disposition: A | Discharge status: Home / Self Care | Payer: BLUE CROSS/BLUE SHIELD | Source: Ambulatory Visit | Attending: Advanced Practice Midwife | Admitting: Advanced Practice Midwife

## 2015-04-05 VITALS — BP 109/64 | HR 79 | Temp 98.0°F | Wt 137.8 lb

## 2015-04-05 DIAGNOSIS — O26899 Other specified pregnancy related conditions, unspecified trimester: Secondary | ICD-10-CM

## 2015-04-05 DIAGNOSIS — F333 Major depressive disorder, recurrent, severe with psychotic symptoms: Secondary | ICD-10-CM

## 2015-04-05 DIAGNOSIS — R109 Unspecified abdominal pain: Secondary | ICD-10-CM

## 2015-04-05 DIAGNOSIS — O98319 Other infections with a predominantly sexual mode of transmission complicating pregnancy, unspecified trimester: Secondary | ICD-10-CM

## 2015-04-05 DIAGNOSIS — O0973 Supervision of high risk pregnancy due to social problems, third trimester: Secondary | ICD-10-CM | POA: Diagnosis not present

## 2015-04-05 DIAGNOSIS — O98819 Other maternal infectious and parasitic diseases complicating pregnancy, unspecified trimester: Principal | ICD-10-CM

## 2015-04-05 DIAGNOSIS — O9989 Other specified diseases and conditions complicating pregnancy, childbirth and the puerperium: Secondary | ICD-10-CM

## 2015-04-05 DIAGNOSIS — A749 Chlamydial infection, unspecified: Secondary | ICD-10-CM

## 2015-04-05 DIAGNOSIS — Z8489 Family history of other specified conditions: Secondary | ICD-10-CM

## 2015-04-05 DIAGNOSIS — O99343 Other mental disorders complicating pregnancy, third trimester: Secondary | ICD-10-CM

## 2015-04-05 DIAGNOSIS — Z832 Family history of diseases of the blood and blood-forming organs and certain disorders involving the immune mechanism: Secondary | ICD-10-CM

## 2015-04-05 LAB — POCT URINALYSIS DIP (DEVICE)
Bilirubin Urine: NEGATIVE
Glucose, UA: NEGATIVE mg/dL
Hgb urine dipstick: NEGATIVE
KETONES UR: NEGATIVE mg/dL
Nitrite: NEGATIVE
PH: 7 (ref 5.0–8.0)
PROTEIN: NEGATIVE mg/dL
SPECIFIC GRAVITY, URINE: 1.02 (ref 1.005–1.030)
Urobilinogen, UA: 1 mg/dL (ref 0.0–1.0)

## 2015-04-05 NOTE — Progress Notes (Signed)
Genetic Counseling  High-Risk Gestation Note  Appointment Date:  04/05/2015 Referred By: [name] Date of Birth:  [birth year] Partner:  Minette Headland   Pregnancy History: G1P0 Estimated Date of Delivery: 06/11/15 Estimated Gestational Age: 71w3dAttending: JJolyn Lent MD   I met with Ms. FRollen Soxand her partner, Mr. HMinette Headland for genetic counseling because of a family history of Factor XI deficiency.  In Summary:   Family history Factor XI deficiency including two paternal uncles who are affected; Patient's father is reportedly either a carrier or also mildly affected   Reviewed autosomal inheritance of Factor XI deficiency  Autosomal recessive inheritance is reported, but heterozygotes (carriers) are often also described as having partial (mild) Factor XI deficiency  Males and females have equal chances to inherit the condition  If patient's father is affected, patient is obligate heterozygote (carrier) for condition  If patient's father is a carrier (heterozygote), patient has 50% chance to also be carrier  Patient would be unlikely to have severe Factor XI deficiency, given the reported family history   Pregnancy in women with Factor XI deficiency (both severe and partial), literature (Wiewel-Verschueren 2015) reported 18%-21% postpartum hemorrhage rates observed  Consider use of antifibrinolytic agents for management/treatment (Bolton-Maggs 2009; Salomen 2005; Chi 2009)  Management suggestions include "watch and wait" approach for women with partial FXI deficiency and no bleeding history   Management suggestions for women with partial FXI deficiency and positive bleeding history including considering prophylactic treatment 3-5 days post-partum  Prophylactic treatment is considered more for women following c-section, though a consensus has not been reached   Patient recently evaluated by Hematology and has follow-up scheduled 04/15/15   We began by  reviewing the family history in detail. The patient reported that she was told that her father is a Factor XI deficiency carrier, or possibly has Factor XI deficiency with mild symptoms. She was unsure of his exact status given that this information was reported recently to her by the patient's mother. Two of the patient's paternal uncles have factor XI deficiency and both reportedly have had bleeding episodes following surgeries. The patient reported a personal history of heavy periods but reported no additional symptoms of a bleeding disorder. The patient was recently evaluated by Hematology/Oncology and has a follow-up scheduled in 1 week to review and discuss labwork performed at her visit on 04/01/15. She reported that a follow-up visit is scheduled with Hematology on 04/15/15 to review results of this lab work. No additional relatives were reported with Factor XI deficiency. The patient reported that her paternal family has [place]ken[place].   Factor XI deficiency, sometimes referred to as Hemophilia C, is a bleeding disorder estimated to occur in 1 in 1,000,000 individuals. It is more common in individuals with Ashkenazi Jewish ancestry, with an approximate 8% carrier/heterozygote frequency. Factor XI is a plasma glycoprotein that participates in the contact phase of blood coagulation. The clinical features of FXI deficiency are variable. Individuals with factor XI deficiency are often described to be asymptomatic, with the first bleeding episodes being present following surgical procedures or trauma. Factor XI deficiency is associated with an increased risk for bruising, nosebleeds, and hematuria. Joint bleeds and spontaneous bleeding are not typically associated with Factor XI deficiency. Bleeding is typically milder compared to other forms of hemophilia (hemophilias A or B), but bleeding cannot consistently be predicted to correlate with the plasma levels of Factor XI activity.   Females with Factor XI deficiency are reported to have  increased incidence of menorrhagia and postpartum hemorrhage. Factor XI deficiency is caused by mutations in the F11 gene, but there are also acquired forms, which can occur following liver damage.  FXI deficiency is typically diagnosed via clinical features. Severe FXI deficiency is defined as <15% FXI, and partial deficiency is described to be FXI activity ranging 20-70%. Individuals with severe FXI deficiency often have prolonged aPTT with normal prothrombin and thrombin times, and individuals with partial FXI deficiency may have normal aPTT.   We reviewed genes and chromosomes and that Factor XI deficiency follows autosomal recessive inheritance. Regarding FXI deficiency, individuals with severe deficiency would be expected to have two copies of a nonworking F11 gene (homozygotes or compound heterozygotes). Carriers in autosomal recessive inheritance often refers to having one copy of a nonworking gene in a particular pair. In FXI deficiency, carriers (heterozygotes) for an F11 mutation would typically be classified to have partial FXI deficiency. Carriers may have no symptoms or mild symptoms of the condition. All offspring of an affected individual with FXI deficiency (homozygotes or compound heterozygotes) would be obligate carriers, or heterozygotes for an F11 gene change. If Ms. Buser's father has two F11 gene changes, then Ms. Kundert would be an obligate carrier and be expected to have partial FXI deficiency. If her father is a Risk manager, then she would have a 1 in 2 (50%) chance to be a carrier/heterozygote.    A pregnancy would only be at increased risk to inherit severe FXI deficiency, if both parents were at least carriers/heterozygotes. Given that the father of the pregnancy reported [place] ancestry, no known Jewish ancestry, and no known consanguinity to Ms. Frommelt, his risk to be a carrier for Factor XI deficiency would  be the general population risk of approximately 1 in 163. Thus, the risk for severe Factor XI deficiency (homozygosity or compound heterozygosity) for the pregnancy is estimated to be 1 in 652, in the case that Ms. Dunnigan is a carrier.   Molecular genetic testing of the factor XI (F11) gene is available. We discussed that genetic testing is most informative when a causative mutation has first been identified in a clinically affected individual (such as the patient's father or paternal uncles). When a causative mutation has been identified in an affected relative, carrier testing becomes informative for other at-risk relatives. In the absence of an identified familial mutation, a negative genetic carrier testing result for the patient would reduce but not eliminate the chance to be a carrier. Alternatively, carrier screening for common mutations in F11 is also available as part of an expanded carrier screening panel, with reported detection rate of >95% in the [place] population. The patient declined genetic testing at this time given that she is underlying clinical evaluation via hematology/oncology and given that she is unsure if genetic testing has been performed for her clinically diagnosed relatives. She may contact us if genetic testing is desired in the future.   Available studies report no consistent increase FXI activity levels throughout pregnancy. We spent time discussing with Ms. Nikyah Hipp that females with Factor XI deficiency, both partial and severe, have been reported to have an increased chance for postpartum hemorrhaging. In a recent review of 27 studies, with a total of 372 women with FXI deficiency (Wiewel-Verschueren 2015), postpartum hemorrhage was reported in 18% of vaginal deliveries and in 21% of cesarean section deliveries.  Data are limited regarding prophylactic treatment. Use of antifibrinolytic agents is suggested for consideration for most patients to prevent  postpartum hemorrhage (  Bolton-Maggs 2009; Salomen 2005; Chi 2009). Suggested management for women with partial factor XI deficiency and no positive bleeding history includes a "watch and wait" approach with replacement therapy when bleeding occurs. For women with partial factor XI deficiency and positive previous bleeding history, suggested management includes prophylactic therapy with antifibrinolytic agents 3-5 days after vaginal delivery. Routine replacement therapy is used more frequently in the setting of cesarean section, though there is not a consensus among providers.   The family histories were otherwise found to be noncontributory for birth defects, intellectual disability, and known genetic conditions. Without further information regarding the provided family history, an accurate genetic risk cannot be calculated. Further genetic counseling is warranted if more information is obtained.  Ms. Akyah Lagrange denied exposure to environmental toxins or chemical agents. She denied the use of alcohol, tobacco or street drugs. She denied significant viral illnesses during the course of her pregnancy. Her medical and surgical histories were noncontributory.   I counseled this couple regarding the above risks and available options.  The approximate face-to-face time with the genetic counselor was 35 minutes.  Chipper Oman, MS Certified Genetic Counselor 04/05/2015

## 2015-04-05 NOTE — Progress Notes (Signed)
Subjective:  Alice Castillo is a 20 y.o. G1P0 at [redacted]w[redacted]d being seen today for ongoing prenatal care.  She is currently monitored for the following issues for this high-risk pregnancy and has Major depressive disorder, recurrent, severe with psychotic features ([place]); Hallucinations; Severe recurrent major depressive disorder with psychotic features ([place]); PTSD (post-traumatic stress disorder); Supervision of high risk pregnancy due to social problems; Anxiety during pregnancy, antepartum; Rh negative state in antepartum period; Family history of clotting disorder; Paresthesias in antepartum period in third trimester; Bleeding tendency ([place]); and Chlamydia infection affecting pregnancy on her problem list.  Patient reports abdominal pain in RUQ, constant dull pain, worse at night when trying to sleep.  Contractions: Not present. Vag. Bleeding: None.  Movement: Present. Denies leaking of fluid.   The following portions of the patient's history were reviewed and updated as appropriate: allergies, current medications, past family history, past medical history, past social history, past surgical history and problem list. Problem list updated.  Objective:   Filed Vitals:   04/05/15 1449  BP: 109/64  Pulse: 79  Temp: 98 F (36.7 C)  Weight: 137 lb 12.8 oz (62.506 kg)    Fetal Status: Fetal Heart Rate (bpm): 150 Fundal Height: 30 cm Movement: Present     General:  Alert, oriented and cooperative. Patient is in no acute distress.  Skin: Skin is warm and dry. No rash noted.   Cardiovascular: Normal heart rate noted  Respiratory: Normal respiratory effort, no problems with respiration noted  Abdomen: Soft, gravid, appropriate for gestational age. Pain/Pressure: Present     Pelvic: Vag. Bleeding: None Vag D/C Character: White   Cervical exam deferred        Extremities: Normal range of motion.  Edema: Trace  Mental Status: Normal mood and affect. Normal behavior. Normal judgment and thought content.    Urinalysis: Urine Protein: Negative Urine Glucose: Negative  Assessment and Plan:  Pregnancy: G1P0 at [redacted]w[redacted]d  1. Chlamydia infection affecting pregnancy --TOC neg 12/27.  Partner treated. Retest today at pt request.   - GC/Chlamydia probe amp ([place])not at [facility]  2. Supervision of high risk pregnancy due to social problems, third trimester   3. Major depressive disorder, recurrent, severe with psychotic features ([place])   4. Abdominal pain affecting pregnancy, antepartum --RUQ nontender to palpation, pain not associated with n/v, unaffected by eating, BP wnl. No sign of hernia, pain not near umbilicus. Likely musculoskeletal in nature.   Pt to use Tylenol/Benadryl for sleep, heating pad prn, follow up with office if pain worsens/persists.  Preterm labor symptoms and general obstetric precautions including but not limited to vaginal bleeding, contractions, leaking of fluid and fetal movement were reviewed in detail with the patient. Please refer to After Visit Summary for other counseling recommendations.  Return in about 2 weeks (around 04/19/2015).   [name], CNM

## 2015-04-06 LAB — GC/CHLAMYDIA PROBE AMP (~~LOC~~) NOT AT ARMC
Chlamydia: NEGATIVE
Neisseria Gonorrhea: NEGATIVE

## 2015-04-07 ENCOUNTER — Encounter (HOSPITAL_COMMUNITY): Payer: Self-pay

## 2015-04-07 ENCOUNTER — Other Ambulatory Visit: Payer: Self-pay

## 2015-04-07 DIAGNOSIS — Z3A30 30 weeks gestation of pregnancy: Secondary | ICD-10-CM | POA: Insufficient documentation

## 2015-04-07 DIAGNOSIS — Z8489 Family history of other specified conditions: Secondary | ICD-10-CM | POA: Insufficient documentation

## 2015-04-07 MED ORDER — FAMOTIDINE 20 MG PO TABS: 20.0000 mg | ORAL_TABLET | Freq: Two times a day (BID) | ORAL | Status: DC

## 2015-04-07 MED ORDER — PROMETHAZINE HCL 25 MG PO TABS: 25.0000 mg | ORAL_TABLET | Freq: Four times a day (QID) | ORAL | Status: DC | PRN

## 2015-04-07 NOTE — Progress Notes (Signed)
Pt came to front office and c/o being nauseous, stomach pain, and vomiting x 1.  Pt stated that she has not attempted to take Tylenol for the pain.  I advised pt to take Tylenol and take rest periods.  For the nausea, Dr. Elly Modena ordered phenergan 25 mg tablet to take as needed and also Pepcid for possible acid reflux.  I advised pt that if she does not get any relief then to please call the office tomorrow.   Pt agreed to recommendations.

## 2015-04-15 ENCOUNTER — Ambulatory Visit: Payer: Self-pay | Admitting: Hematology

## 2015-04-19 ENCOUNTER — Ambulatory Visit (INDEPENDENT_AMBULATORY_CARE_PROVIDER_SITE_OTHER): Payer: BLUE CROSS/BLUE SHIELD | Admitting: Advanced Practice Midwife

## 2015-04-19 ENCOUNTER — Encounter: Payer: Self-pay | Admitting: Advanced Practice Midwife

## 2015-04-19 DIAGNOSIS — F333 Major depressive disorder, recurrent, severe with psychotic symptoms: Secondary | ICD-10-CM | POA: Diagnosis not present

## 2015-04-19 DIAGNOSIS — O0973 Supervision of high risk pregnancy due to social problems, third trimester: Secondary | ICD-10-CM

## 2015-04-19 DIAGNOSIS — O99343 Other mental disorders complicating pregnancy, third trimester: Secondary | ICD-10-CM | POA: Diagnosis not present

## 2015-04-19 LAB — POCT URINALYSIS DIP (DEVICE)
BILIRUBIN URINE: NEGATIVE
Bilirubin Urine: NEGATIVE
GLUCOSE, UA: NEGATIVE mg/dL
Glucose, UA: NEGATIVE mg/dL
HGB URINE DIPSTICK: NEGATIVE
Hgb urine dipstick: NEGATIVE
Ketones, ur: NEGATIVE mg/dL
Ketones, ur: NEGATIVE mg/dL
NITRITE: NEGATIVE
NITRITE: NEGATIVE
PH: 6 (ref 5.0–8.0)
PROTEIN: NEGATIVE mg/dL
Protein, ur: NEGATIVE mg/dL
Specific Gravity, Urine: >=1.03 (ref 1.005–1.030)
Specific Gravity, Urine: 1.025 (ref 1.005–1.030)
UROBILINOGEN UA: 1 mg/dL (ref 0.0–1.0)
UROBILINOGEN UA: 1 mg/dL (ref 0.0–1.0)
pH: 6 (ref 5.0–8.0)

## 2015-04-19 NOTE — Progress Notes (Signed)
Subjective:  Alice Castillo is a 20 y.o. G1P0 at [redacted]w[redacted]d being seen today for ongoing prenatal care.  She is currently monitored for the following issues for this low-risk pregnancy and has Major depressive disorder, recurrent, severe with psychotic features ([place]); Hallucinations; Severe recurrent major depressive disorder with psychotic features ([place]); PTSD (post-traumatic stress disorder); Supervision of high risk pregnancy due to social problems; Anxiety during pregnancy, antepartum; Rh negative state in antepartum period; Family history of clotting disorder; Paresthesias in antepartum period in third trimester; Bleeding tendency ([place]); Chlamydia infection affecting pregnancy; Family history of genetic disorder; and [redacted] weeks gestation of pregnancy on her problem list.  Patient reports persistent epigastric superficial pain.  Contractions: Not present. Vag. Bleeding: None.  Movement: Present. Denies leaking of fluid. States nausea/vomiting got worse with Reglan et al so she stopped them all. No further vomiting.  The following portions of the patient's history were reviewed and updated as appropriate: allergies, current medications, past family history, past medical history, past social history, past surgical history and problem list. Problem list updated.  Objective:   Filed Vitals:   04/19/15 1417  BP: 105/62  Pulse: 81  Temp: 98.4 F (36.9 C)  Weight: 139 lb 8 oz (63.277 kg)    Fetal Status: Fetal Heart Rate (bpm): 149   Movement: Present     General:  Alert, oriented and cooperative. Patient is in no acute distress.  Skin: Skin is warm and dry. No rash noted.   Cardiovascular: Normal heart rate noted  Respiratory: Normal respiratory effort, no problems with respiration noted  Abdomen: Soft, gravid, appropriate for gestational age. Pain/Pressure: Present     Pelvic: Vag. Bleeding: None Vag D/C Character: White   Cervical exam deferred        Extremities: Normal range of motion.  Edema:  None  Mental Status: Normal mood and affect. Normal behavior. Normal judgment and thought content.   Urinalysis: Urine Protein: Negative Urine Glucose: Negative  Assessment and Plan:  Pregnancy: G1P0 at [redacted]w[redacted]d  1. Supervision of high risk pregnancy due to social problems, third trimester      Continue monitoring for PTL and fetal movement  Preterm labor symptoms and general obstetric precautions including but not limited to vaginal bleeding, contractions, leaking of fluid and fetal movement were reviewed in detail with the patient. Please refer to After Visit Summary for other counseling recommendations.  Return in about 2 weeks (around 05/03/2015) for [facility].   Seabron Spates, CNM

## 2015-04-19 NOTE — Patient Instructions (Signed)

## 2015-04-19 NOTE — Progress Notes (Signed)
Pain- epigastric area  Educated pt on Benefits of Breastfeeding for Mom

## 2015-05-03 ENCOUNTER — Encounter: Payer: Self-pay | Admitting: Hematology

## 2015-05-03 ENCOUNTER — Other Ambulatory Visit: Payer: Self-pay | Admitting: *Deleted

## 2015-05-03 ENCOUNTER — Telehealth: Payer: Self-pay | Admitting: Hematology

## 2015-05-03 ENCOUNTER — Ambulatory Visit (INDEPENDENT_AMBULATORY_CARE_PROVIDER_SITE_OTHER): Payer: BLUE CROSS/BLUE SHIELD | Admitting: Certified Nurse Midwife

## 2015-05-03 VITALS — BP 116/66 | HR 66 | Temp 98.2°F | Wt 147.4 lb

## 2015-05-03 DIAGNOSIS — O0973 Supervision of high risk pregnancy due to social problems, third trimester: Secondary | ICD-10-CM

## 2015-05-03 DIAGNOSIS — F419 Anxiety disorder, unspecified: Secondary | ICD-10-CM

## 2015-05-03 DIAGNOSIS — O99343 Other mental disorders complicating pregnancy, third trimester: Secondary | ICD-10-CM

## 2015-05-03 DIAGNOSIS — O360110 Maternal care for anti-D [Rh] antibodies, first trimester, not applicable or unspecified: Secondary | ICD-10-CM

## 2015-05-03 DIAGNOSIS — O360130 Maternal care for anti-D [Rh] antibodies, third trimester, not applicable or unspecified: Secondary | ICD-10-CM

## 2015-05-03 LAB — POCT URINALYSIS DIP (DEVICE)
Bilirubin Urine: NEGATIVE
Glucose, UA: NEGATIVE mg/dL
Hgb urine dipstick: NEGATIVE
Ketones, ur: NEGATIVE mg/dL
Leukocytes, UA: NEGATIVE
Nitrite: NEGATIVE
Protein, ur: NEGATIVE mg/dL
Specific Gravity, Urine: 1.025 (ref 1.005–1.030)
Urobilinogen, UA: 1 mg/dL (ref 0.0–1.0)
pH: 6.5 (ref 5.0–8.0)

## 2015-05-03 NOTE — Progress Notes (Signed)
Subjective:  Alice Castillo is a 20 y.o. G1P0 at [redacted]w[redacted]d being seen today for ongoing prenatal care.  She is currently monitored for the following issues for this high-risk pregnancy and has Major depressive disorder, recurrent, severe with psychotic features ([place]); Hallucinations; Severe recurrent major depressive disorder with psychotic features ([place]); PTSD (post-traumatic stress disorder); Supervision of high risk pregnancy due to social problems; Anxiety during pregnancy, antepartum; Rh negative state in antepartum period; Family history of clotting disorder; Paresthesias in antepartum period in third trimester; Bleeding tendency ([place]); Chlamydia infection affecting pregnancy; Family history of genetic disorder; and [redacted] weeks gestation of pregnancy on her problem list.  Patient reports no complaints.  Contractions: Not present. Vag. Bleeding: None.  Movement: Present. Denies leaking of fluid.   The following portions of the patient's history were reviewed and updated as appropriate: allergies, current medications, past family history, past medical history, past social history, past surgical history and problem list. Problem list updated.  Objective:   Filed Vitals:   05/03/15 1408  BP: 116/66  Pulse: 66  Temp: 98.2 F (36.8 C)  Weight: 147 lb 6.4 oz (66.86 kg)    Fetal Status: Fetal Heart Rate (bpm): 140 Fundal Height: 34 cm Movement: Present     General:  Alert, oriented and cooperative. Patient is in no acute distress.  Skin: Skin is warm and dry. No rash noted.   Cardiovascular: Normal heart rate noted  Respiratory: Normal respiratory effort, no problems with respiration noted  Abdomen: Soft, gravid, appropriate for gestational age. Pain/Pressure: Present     Pelvic: Vag. Bleeding: None Vag D/C Character: White   Cervical exam deferred        Extremities: Normal range of motion.  Edema: Trace  Mental Status: Normal mood and affect. Normal behavior. Normal judgment and thought content.    Urinalysis: Urine Protein: Negative Urine Glucose: Negative  Assessment and Plan:  Pregnancy: G1P0 at [redacted]w[redacted]d  1. Rh negative state in antepartum period, first trimester, not applicable or unspecified fetus   2. Supervision of high risk pregnancy due to social problems, third trimester   3. Anxiety during pregnancy, antepartum, third trimester   Pretermlabor symptoms and general obstetric precautions including but not limited to vaginal bleeding, contractions, leaking of fluid and fetal movement were reviewed in detail with the patient. Please refer to After Visit Summary for other counseling recommendations.  Return in about 2 weeks (around 05/17/2015).   [name], CNM

## 2015-05-03 NOTE — Patient Instructions (Signed)
Group B streptococcus (GBS) is a type of bacteria often found in healthy women. GBS is not the same as the bacteria that causes strep throat. You may have GBS in your vagina, rectum, or bladder. GBS does not spread through sexual contact, but it can be passed to a baby during childbirth. This can be dangerous for your baby. It is not dangerous to you and usually does not cause any symptoms. Your health care provider may test you for GBS when your pregnancy is between 35 and 37 weeks. GBS is dangerous only during birth, so there is no need to test for it earlier. It is possible to have GBS during pregnancy and never pass it to your baby. If your test results are positive for GBS, your health care provider may recommend giving you antibiotic medicine during delivery to make sure your baby stays healthy. RISK FACTORS You are more likely to pass GBS to your baby if:   Your water breaks (ruptured membrane) or you go into labor before 37 weeks.  Your water breaks 18 hours before you deliver.  You passed GBS during a previous pregnancy.  You have a urinary tract infection caused by GBS any time during pregnancy.  You have a fever during labor. SYMPTOMS Most women who have GBS do not have any symptoms. If you have a urinary tract infection caused by GBS, you might have frequent or painful urination and fever. Babies who get GBS usually show symptoms within 7 days of birth. Symptoms may include:   Breathing problems.  Heart and blood pressure problems.  Digestive and kidney problems. DIAGNOSIS Routine screening for GBS is recommended for all pregnant women. A health care provider takes a sample of the fluid in your vagina and rectum with a swab. It is then sent to a lab to be checked for GBS. A sample of your urine may also be checked for the bacteria.  TREATMENT If you test positive for GBS, you may need treatment with an antibiotic medicine during labor. As soon as you go into labor, or as soon as  your membranes rupture, you will get the antibiotic medicine through an IV access. You will continue to get the medicine until after you give birth. You do not need antibiotic medicine if you are having a cesarean delivery.If your baby shows signs or symptoms of GBS after birth, your baby can also be treated with an antibiotic medicine. HOME CARE INSTRUCTIONS   Take all antibiotic medicine as prescribed by your health care provider. Only take medicine as directed.   Continue with prenatal visits and care.   Keep all follow-up appointments.  SEEK MEDICAL CARE IF:   You have pain when you urinate.   You have to urinate frequently.   You have a fever.  SEEK IMMEDIATE MEDICAL CARE IF:   Your membranes rupture.  You go into labor.   This information is not intended to replace advice given to you by your health care provider. Make sure you discuss any questions you have with your health care provider.   Document Released: 05/22/2007 Document Revised: 02/17/2013 Document Reviewed: 12/05/2012 Elsevier Interactive Patient Education 2016 [employer]vier Inc.  

## 2015-05-03 NOTE — Telephone Encounter (Signed)
Spoke with patient to confirm appt date/time for 3/14 at 3pm

## 2015-05-10 ENCOUNTER — Telehealth: Payer: Self-pay | Admitting: Hematology

## 2015-05-10 ENCOUNTER — Ambulatory Visit (HOSPITAL_BASED_OUTPATIENT_CLINIC_OR_DEPARTMENT_OTHER): Payer: BLUE CROSS/BLUE SHIELD | Admitting: Hematology

## 2015-05-10 ENCOUNTER — Encounter: Payer: Self-pay | Admitting: Hematology

## 2015-05-10 VITALS — BP 104/54 | HR 96 | Temp 98.3°F | Resp 18 | Ht 66.0 in | Wt 146.4 lb

## 2015-05-10 DIAGNOSIS — O4693 Antepartum hemorrhage, unspecified, third trimester: Secondary | ICD-10-CM | POA: Diagnosis not present

## 2015-05-10 DIAGNOSIS — D681 Hereditary factor XI deficiency: Secondary | ICD-10-CM

## 2015-05-10 NOTE — Telephone Encounter (Signed)
per pof to sch pt appt-gqave pt copy of avs °

## 2015-05-11 DIAGNOSIS — D681 Hereditary factor XI deficiency: Secondary | ICD-10-CM | POA: Insufficient documentation

## 2015-05-11 NOTE — Progress Notes (Signed)
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   HISTORY OF PRESENTING ILLNESS: please see my initial consultation for details on initial presentation.  INTERVAL HISTORY  Ms Metro is here for discussion of her extensive coagulation testing labs.  She notes no acute new concerns.  No issues with bleeding.  Her due date is next month.  Notes that she was having heavy menstrual bleeding prior to getting pregnant.  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 We discussed what this meant and the treatment recommendations that we have forwarded to her obstetrics team.  I also gave her a copy of my initial consultation so that she is aware of the recommendations. She was also given a copy of her genetic counseling note some that she has details of our discussion with the genetic counselor. No other acute new concerns at this time.  She notes that it has been a stressful time since this pregnancy with unplanned and her partner appears to be having a mixed feelings about it. She notes that her mother has been quite supportive of the process.   MEDICAL HISTORY:  Past Medical History  Diagnosis Date  . Chlamydia infection   . Anxiety   . Depression   . Bipolar 1 disorder ([place])     SURGICAL HISTORY: Past Surgical History  Procedure Laterality Date  . No past surgeries      SOCIAL HISTORY: Social History   Social History  . Marital Status: Single    Spouse Name: N/A  . Number of Children: N/A  . Years of Education: N/A   Occupational History  . Not on file.   Social History Main Topics  . Smoking status: Never Smoker   . Smokeless  tobacco: Never Used  . Alcohol Use: 0.6 oz/week    1 Shots of liquor per week     Comment: stopped when found out pregnant  . Drug Use: No  . Sexual Activity: Not Currently    Birth Control/ Protection: None   Other Topics Concern  . Not on file   Social History Narrative    FAMILY HISTORY: Family History  Problem Relation Age of Onset  . Clotting disorder Father     Factor XI deficiency (affected or carrier?)  . Heart disease Maternal Uncle   . Diabetes Maternal Uncle   . Heart disease Paternal Uncle   . Diabetes Paternal Uncle   . Clotting disorder Paternal Uncle     Factor XI (26) deficiency  . Clotting disorder Paternal Uncle     Factor XI deficiency    ALLERGIES:  is allergic to food and lactose intolerance (gi).  MEDICATIONS:  Current Outpatient Prescriptions  Medication Sig Dispense Refill  . potassium chloride SA (K-DUR,KLOR-CON) 20 MEQ tablet Take 1 tablet (20 mEq total) by mouth 2 (two) times daily. 5 tablet 0  . Prenatal Multivit-Min-Fe-FA (PRENATAL VITAMINS) 0.8 MG tablet Take 1 tablet by mouth daily. 30 tablet 12   No current facility-administered medications for this visit.    REVIEW OF SYSTEMS:    10 Point review of Systems was done is negative except as noted above.  PHYSICAL EXAMINATION: ECOG PERFORMANCE STATUS: 0 -  Asymptomatic  . Filed Vitals:   05/10/15 1447  BP: 104/54  Pulse: 96  Temp: 98.3 F (36.8 C)  Resp: 18   Filed Weights   05/10/15 1447  Weight: 146 lb 6.4 oz (66.407 kg)   .Body mass index is 23.64 kg/(m^2).  GENERAL:alert, in no acute distress and comfortable SKIN: skin color, texture, turgor are normal, no rashes or significant lesions EYES: normal, conjunctiva are pink and non-injected, sclera clear OROPHARYNX:no exudate, no erythema and lips, buccal mucosa, and tongue normal  NECK: supple, no JVD, thyroid normal size, non-tender, without nodularity LYMPH:  no palpable lymphadenopathy in the cervical, axillary or  inguinal LUNGS: clear to auscultation with normal respiratory effort HEART: regular rate & rhythm,  no murmurs and no lower extremity edema ABDOMEN: abdomen soft, non-tender, normoactive bowel sounds  Musculoskeletal: no cyanosis of digits and no clubbing  PSYCH: alert & oriented x 3 with fluent speech NEURO: no focal motor/sensory deficits  LABORATORY DATA:  I have reviewed the data as listed  . CBC Latest Ref Rng 04/01/2015 03/22/2015 01/24/2015  WBC 3.9 - 10.3 10e3/uL 6.3 7.0 7.4  Hemoglobin 11.6 - 15.9 g/dL 11.0(L) 10.6(L) 11.3(L)  Hematocrit 34.8 - 46.6 % 31.5(L) 31.2(L) 33.7(L)  Platelets 145 - 400 10e3/uL 147 184 209   . CBC    Component Value Date/Time   WBC 6.3 04/01/2015 1128   WBC 7.0 03/22/2015 1350   RBC 3.49* 04/01/2015 1128   RBC 3.40* 03/22/2015 1350   HGB 11.0* 04/01/2015 1128   HGB 10.6* 03/22/2015 1350   HCT 31.5* 04/01/2015 1128   HCT 31.2* 03/22/2015 1350   PLT 147 04/01/2015 1128   PLT 184 03/22/2015 1350   MCV 90.3 04/01/2015 1128   MCV 91.8 03/22/2015 1350   MCH 31.5 04/01/2015 1128   MCH 31.2 03/22/2015 1350   MCHC 34.9 04/01/2015 1128   MCHC 34.0 03/22/2015 1350   RDW 12.5 04/01/2015 1128   RDW 12.9 03/22/2015 1350   LYMPHSABS 1.3 04/01/2015 1128   LYMPHSABS 1.3 01/24/2015 1632   MONOABS 0.5 04/01/2015 1128   MONOABS 0.4 01/24/2015 1632   EOSABS 0.0 04/01/2015 1128   EOSABS 0.1 01/24/2015 1632   BASOSABS 0.0 04/01/2015 1128   BASOSABS 0.0 01/24/2015 1632   . CMP Latest Ref Rng 04/01/2015 01/15/2015 09/18/2014  Glucose 70 - 140 mg/dl 77 86 92  BUN 7.0 - 26.0 mg/dL 4.9(L) <5(L) 9  Creatinine 0.6 - 1.1 mg/dL 0.6 0.45 0.60  Sodium 136 - 145 mEq/L 139 135 139  Potassium 3.5 - 5.1 mEq/L 3.5 3.4(L) 3.3(L)  Chloride 101 - 111 mmol/L - 104 108  CO2 22 - 29 mEq/L 22 22 23   Calcium 8.4 - 10.4 mg/dL 9.1 9.2 9.0  Total Protein 6.4 - 8.3 g/dL 6.5 6.5 7.8  Total Bilirubin 0.20 - 1.20 mg/dL 0.42 0.3 0.6  Alkaline Phos 40 - 150 U/L 104 52 66  AST 5 -  34 U/L 14 16 19   ALT 0 - 55 U/L 13 14 20    Component     Latest Ref Rng 04/01/2015  Factor VIII Activity     57 - 163 % 163  von Willebrand Factor (vWF) Ag     50 - 200 % 167  vWF Activity     50 - 200 % 142  Interpretation      Note  PDF Image      .  INR     0.8 - 1.2 1.2  Prothrombin Time  9.1 - 12.0 sec 11.9  PFA Interpretation        Collagen / Epinephrine     0 - 193 seconds 129  APTT     24 - 33 sec 31  Factor XI Activity     60 - 150 % 39 (L)    RADIOGRAPHIC STUDIES: I have personally reviewed the radiological images as listed and agreed with the findings in the report. No results found.  ASSESSMENT & PLAN:   20 year old Caucasian female with a family history of possible factor XI deficiency in her father and paternal uncle.  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 Normal PTT, PT, 1 mg panel and platelet function assay. Patient has not had any issues with significant spontaneous bleeding.  This was worked would typically be expected given her situation. She has had some minimal tendency for increased bruisability and has heavier menstrual periods previously. She has not had any surgical or procedure of hemostatic challenges in the past and determine risk of bleeding from her upcoming parturition. Plan -No role for routine prophylaxis for daily activities in the absence of bleeding. -would consider an antifibrinolytic for heavy menstrual losses. -Would absolutely avoid any NSAIDs. -epidural anesthesia cannot be recommended without complete replacement of factor XI and might carry an increased risk of spinal hematoma.  -given the presence of only partial deficiency would not recommend prophylactic factor XI replacement via FFP for routine vaginal delivery.  However would recommend antifibrinolytics (Amicar or lysteda) for 5-7  days post-partum starting immediately after delivery to reduce the risk of postpartum hemorrhage. If any time however the patient develops uncontrollable postpartum hemorrhage in the absence of an obvious alternative etiology (uterine atony/retained POC etc) would need to have FFP replacement for factor XI correction. -if an episiotomy is necessary would need FFP replacement for FXI correction.  -If the patient needs to go for a C-section would recommend prophylactic and maintenance replacement. 10-20 ml/kg of FFP starting pre/intra-operatively and then 5-40ml/kg q24h post-operative for 3 doses. (longer if evidence of significant surgical bleeding) + Amicar 1000 mg po 4 times daily for 10-14 days   RTC with Dr Irene Limbo in 3 months for re-evaluation of menstrual losses or other bleeding issues and determine the need for anti-fibrinolytic therapy  All of the patients questions were answered with apparent satisfaction. The patient knows to call the clinic with any problems, questions or concerns.  I spent 25 minutes counseling the patient face to face. The total time spent in the appointment was 25 minutes and more than 50% was on counseling and direct patient cares.    Sullivan Lone MD [place] AAHIVMS [facility] Hematology/Oncology Physician [facility]  (Office):       [phone] (Work cell):  [phone] (Fax):           [phone]

## 2015-05-17 ENCOUNTER — Ambulatory Visit (INDEPENDENT_AMBULATORY_CARE_PROVIDER_SITE_OTHER): Payer: BLUE CROSS/BLUE SHIELD | Admitting: Advanced Practice Midwife

## 2015-05-17 VITALS — BP 115/69 | HR 86 | Wt 149.7 lb

## 2015-05-17 DIAGNOSIS — O0973 Supervision of high risk pregnancy due to social problems, third trimester: Secondary | ICD-10-CM

## 2015-05-17 DIAGNOSIS — Z113 Encounter for screening for infections with a predominantly sexual mode of transmission: Secondary | ICD-10-CM | POA: Diagnosis not present

## 2015-05-17 LAB — POCT URINALYSIS DIP (DEVICE)
BILIRUBIN URINE: NEGATIVE
Glucose, UA: NEGATIVE mg/dL
HGB URINE DIPSTICK: NEGATIVE
KETONES UR: NEGATIVE mg/dL
NITRITE: NEGATIVE
PH: 7 (ref 5.0–8.0)
Protein, ur: NEGATIVE mg/dL
SPECIFIC GRAVITY, URINE: 1.025 (ref 1.005–1.030)
Urobilinogen, UA: 1 mg/dL (ref 0.0–1.0)

## 2015-05-17 LAB — OB RESULTS CONSOLE GC/CHLAMYDIA: Gonorrhea: NEGATIVE

## 2015-05-17 LAB — OB RESULTS CONSOLE GBS: STREP GROUP B AG: NEGATIVE

## 2015-05-17 NOTE — Progress Notes (Signed)
Subjective:  Alice Castillo is a 20 y.o. G1P0 at [redacted]w[redacted]d being seen today for ongoing prenatal care.  She is currently monitored for the following issues for this high-risk pregnancy and has Major depressive disorder, recurrent, severe with psychotic features ([place]); Hallucinations; Severe recurrent major depressive disorder with psychotic features ([place]); PTSD (post-traumatic stress disorder); Supervision of high risk pregnancy due to social problems; Anxiety during pregnancy, antepartum; Rh negative state in antepartum period; Family history of clotting disorder; Paresthesias in antepartum period in third trimester; Bleeding tendency ([place]); Chlamydia infection affecting pregnancy; Family history of genetic disorder; [redacted] weeks gestation of pregnancy; and 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  Patient reports no complaints.  Contractions: Not present. Vag. Bleeding: None.  Movement: Present. Denies leaking of fluid.   The following portions of the patient's history were reviewed and updated as appropriate: allergies, current medications, past family history, past medical history, past social history, past surgical history and problem list. Problem list updated.  Objective:   Filed Vitals:   05/17/15 1359  BP: 115/69  Pulse: 86  Weight: 149 lb 11.2 oz (67.903 kg)    Fetal Status: Fetal Heart Rate (bpm): 140 Fundal Height: 36 cm Movement: Present  Presentation: Vertex  General:  Alert, oriented and cooperative. Patient is in no acute distress.  Skin: Skin is warm and dry. No rash noted.   Cardiovascular: Normal heart rate noted  Respiratory: Normal respiratory effort, no problems with respiration noted  Abdomen: Soft, gravid, appropriate for gestational age. Pain/Pressure: Present     Pelvic: Vag. Bleeding: None     Cervical exam performed Dilation: Closed Effacement (%): 50 Station: -3  Vertex by [employer] today  Extremities: Normal range of motion.  Edema: Trace  Mental Status: Normal  mood and affect. Normal behavior. Normal judgment and thought content.   Urinalysis: Urine Protein: Negative Urine Glucose: Negative  Assessment and Plan:  Pregnancy: G1P0 at [redacted]w[redacted]d  1. Supervision of high risk pregnancy due to social problems, third trimester  - GC/Chlamydia probe amp ([place])not at [facility] - Culture, beta strep (group b only)  Term labor symptoms and general obstetric precautions including but not limited to vaginal bleeding, contractions, leaking of fluid and fetal movement were reviewed in detail with the patient. Please refer to After Visit Summary for other counseling recommendations.  Return in about 1 week (around 05/24/2015).   [name], CNM

## 2015-05-18 LAB — GC/CHLAMYDIA PROBE AMP (~~LOC~~) NOT AT ARMC
Chlamydia: NEGATIVE
Neisseria Gonorrhea: NEGATIVE

## 2015-05-19 LAB — CULTURE, BETA STREP (GROUP B ONLY)

## 2015-05-23 ENCOUNTER — Encounter: Payer: Self-pay | Admitting: Advanced Practice Midwife

## 2015-05-23 ENCOUNTER — Ambulatory Visit (INDEPENDENT_AMBULATORY_CARE_PROVIDER_SITE_OTHER): Payer: BLUE CROSS/BLUE SHIELD | Admitting: Advanced Practice Midwife

## 2015-05-23 VITALS — BP 112/70 | HR 78 | Temp 98.3°F | Wt 150.1 lb

## 2015-05-23 DIAGNOSIS — O0973 Supervision of high risk pregnancy due to social problems, third trimester: Secondary | ICD-10-CM

## 2015-05-23 DIAGNOSIS — Z832 Family history of diseases of the blood and blood-forming organs and certain disorders involving the immune mechanism: Secondary | ICD-10-CM

## 2015-05-23 LAB — POCT URINALYSIS DIP (DEVICE)
BILIRUBIN URINE: NEGATIVE
Glucose, UA: NEGATIVE mg/dL
HGB URINE DIPSTICK: NEGATIVE
KETONES UR: NEGATIVE mg/dL
NITRITE: NEGATIVE
Protein, ur: 30 mg/dL — AB
Urobilinogen, UA: 1 mg/dL (ref 0.0–1.0)
pH: 6.5 (ref 5.0–8.0)

## 2015-05-23 NOTE — Progress Notes (Signed)
Moderate Leukocytes in urine.  

## 2015-05-23 NOTE — Progress Notes (Signed)
Breastfeeding tip of the week reviewed C/o intermittent low back pain

## 2015-05-23 NOTE — Patient Instructions (Signed)
Vaginal Delivery °During delivery, your health care provider will help you give birth to your baby. During a vaginal delivery, you will work to push the baby out of your vagina. However, before you can push your baby out, a few things need to happen. The opening of your uterus (cervix) has to soften, thin out, and open up (dilate) all the way to 10 cm. Also, your baby has to move down from the uterus into your vagina.  °SIGNS OF LABOR  °Your health care provider will first need to make sure you are in labor. Signs of labor include:  °· Passing what is called the mucous plug before labor begins. This is a small amount of blood-stained mucus. °· Having regular, painful uterine contractions.   °· The time between contractions gets shorter.   °· The discomfort and pain gradually get more intense. °· Contraction pains get worse when walking and do not go away when resting.   °· Your cervix becomes thinner (effacement) and dilates. °BEFORE THE DELIVERY °Once you are in labor and admitted into the hospital or care center, your health care provider may do the following:  °· Perform a complete physical exam. °· Review any complications related to pregnancy or labor.  °· Check your blood pressure, pulse, temperature, and heart rate (vital signs).   °· Determine if, and when, the rupture of amniotic membranes occurred. °· Do a vaginal exam (using a sterile glove and lubricant) to determine:   °¨ The position (presentation) of the baby. Is the baby's head presenting first (vertex) in the birth canal (vagina), or are the feet or buttocks first (breech)?   °¨ The level (station) of the baby's head within the birth canal.   °¨ The effacement and dilatation of the cervix.   °· An electronic fetal monitor is usually placed on your abdomen when you first arrive. This is used to monitor your contractions and the baby's heart rate. °¨ When the monitor is on your abdomen (external fetal monitor), it can only pick up the frequency and  length of your contractions. It cannot tell the strength of your contractions. °¨ If it becomes necessary for your health care provider to know exactly how strong your contractions are or to see exactly what the baby's heart rate is doing, an internal monitor may be inserted into your vagina and uterus. Your health care provider will discuss the benefits and risks of using an internal monitor and obtain your permission before inserting the device. °¨ Continuous fetal monitoring may be needed if you have an epidural, are receiving certain medicines (such as oxytocin), or have pregnancy or labor complications. °· An IV access tube may be placed into a vein in your arm to deliver fluids and medicines if necessary. °THREE STAGES OF LABOR AND DELIVERY °Normal labor and delivery is divided into three stages. °First Stage °This stage starts when you begin to contract regularly and your cervix begins to efface and dilate. It ends when your cervix is completely open (fully dilated). The first stage is the longest stage of labor and can last from 3 hours to 15 hours.  °Several methods are available to help with labor pain. You and your health care provider will decide which option is best for you. Options include:  °· Opioid medicines. These are strong pain medicines that you can get through your IV tube or as a shot into your muscle. These medicines lessen pain but do not make it go away completely.  °· Epidural. A medicine is given through a thin tube that   is inserted in your back. The medicine numbs the lower part of your body and prevents any pain in that area. °· Paracervical pain medicine. This is an injection of an anesthetic on each side of your cervix.   °· You may request natural childbirth, which does not involve the use of pain medicines or an epidural during labor and delivery. Instead, you will use other things, such as breathing exercises, to help cope with the pain. °Second Stage °The second stage of labor  begins when your cervix is fully dilated at 10 cm. It continues until you push your baby down through the birth canal and the baby is born. This stage can take only minutes or several hours. °· The location of your baby's head as it moves through the birth canal is reported as a number called a station. If the baby's head has not started its descent, the station is described as being at minus 3 (-3). When your baby's head is at the zero station, it is at the middle of the birth canal and is engaged in the pelvis. The station of your baby helps indicate the progress of the second stage of labor. °· When your baby is born, your health care provider may hold the baby with his or her head lowered to prevent amniotic fluid, mucus, and blood from getting into the baby's lungs. The baby's mouth and nose may be suctioned with a small bulb syringe to remove any additional fluid. °· Your health care provider may then place the baby on your stomach. It is important to keep the baby from getting cold. To do this, the health care provider will dry the baby off, place the baby directly on your skin (with no blankets between you and the baby), and cover the baby with warm, dry blankets.   °· The umbilical cord is cut. °Third Stage °During the third stage of labor, your health care provider will deliver the placenta (afterbirth) and make sure your bleeding is under control. The delivery of the placenta usually takes about 5 minutes but can take up to 30 minutes. After the placenta is delivered, a medicine may be given either by IV or injection to help contract the uterus and control bleeding. If you are planning to breastfeed, you can try to do so now. °After you deliver the placenta, your uterus should contract and get very firm. If your uterus does not remain firm, your health care provider will massage it. This is important because the contraction of the uterus helps cut off bleeding at the site where the placenta was attached  to your uterus. If your uterus does not contract properly and stay firm, you may continue to bleed heavily. If there is a lot of bleeding, medicines may be given to contract the uterus and stop the bleeding.  °  °This information is not intended to replace advice given to you by your health care provider. Make sure you discuss any questions you have with your health care provider. °  °Document Released: 11/22/2007 Document Revised: 03/05/2014 Document Reviewed: 10/10/2011 °Elsevier Interactive Patient Education ©2016 [employer] ° °

## 2015-05-23 NOTE — Progress Notes (Signed)
Subjective:  Alice Castillo is a 20 y.o. G1P0 at [redacted]w[redacted]d being seen today for ongoing prenatal care.  She is currently monitored for the following issues for this low-risk pregnancy and has Major depressive disorder, recurrent, severe with psychotic features ([place]); Hallucinations; Severe recurrent major depressive disorder with psychotic features ([place]); PTSD (post-traumatic stress disorder); Supervision of high risk pregnancy due to social problems; Anxiety during pregnancy, antepartum; Rh negative state in antepartum period; Family history of clotting disorder; Paresthesias in antepartum period in third trimester; Bleeding tendency ([place]); Chlamydia infection affecting pregnancy; Family history of genetic disorder; [redacted] weeks gestation of pregnancy; and 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  Patient reports no complaints.  Contractions: Not present. Vag. Bleeding: None.  Movement: Present. Denies leaking of fluid.   The following portions of the patient's history were reviewed and updated as appropriate: allergies, current medications, past family history, past medical history, past social history, past surgical history and problem list. Problem list updated.  Objective:   Filed Vitals:   05/23/15 1527  BP: 112/70  Pulse: 78  Temp: 98.3 F (36.8 C)  Weight: 150 lb 1.6 oz (68.085 kg)    Fetal Status: Fetal Heart Rate (bpm): 141   Movement: Present     General:  Alert, oriented and cooperative. Patient is in no acute distress.  Skin: Skin is warm and dry. No rash noted.   Cardiovascular: Normal heart rate noted  Respiratory: Normal respiratory effort, no problems with respiration noted  Abdomen: Soft, gravid, appropriate for gestational age. Pain/Pressure: Present     Pelvic: Vag. Bleeding: None     Cervical exam deferred        Extremities: Normal range of motion.  Edema: None  Mental Status: Normal mood and affect. Normal behavior. Normal judgment and thought content.   Urinalysis:  Urine Protein: 1+ Urine Glucose: Negative  Assessment and Plan:  Pregnancy: G1P0 at [redacted]w[redacted]d  1. Family history of clotting disorder     Saw Heme/Onc.  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 They recommend she avoid epidural.  Discussed nitrous oxide >  Wants to try that.      See Problem List for detailed recommendations  2. Supervision of high risk pregnancy due to social problems, third trimester        Term labor symptoms and general obstetric precautions including but not limited to vaginal bleeding, contractions, leaking of fluid and fetal movement were reviewed in detail with the patient. Please refer to After Visit Summary for other counseling recommendations.  Return in about 1 week (around 05/30/2015) for [facility].   Seabron Spates, CNM

## 2015-05-30 ENCOUNTER — Encounter: Payer: Self-pay | Admitting: Advanced Practice Midwife

## 2015-05-31 ENCOUNTER — Ambulatory Visit (INDEPENDENT_AMBULATORY_CARE_PROVIDER_SITE_OTHER): Payer: BLUE CROSS/BLUE SHIELD | Admitting: Family

## 2015-05-31 VITALS — BP 111/73 | HR 74 | Wt 152.4 lb

## 2015-05-31 DIAGNOSIS — O0973 Supervision of high risk pregnancy due to social problems, third trimester: Secondary | ICD-10-CM

## 2015-05-31 LAB — POCT URINALYSIS DIP (DEVICE)
Bilirubin Urine: NEGATIVE
GLUCOSE, UA: NEGATIVE mg/dL
HGB URINE DIPSTICK: NEGATIVE
Ketones, ur: NEGATIVE mg/dL
Nitrite: NEGATIVE
PROTEIN: 30 mg/dL — AB
UROBILINOGEN UA: 0.2 mg/dL (ref 0.0–1.0)
pH: 6 (ref 5.0–8.0)

## 2015-05-31 NOTE — Progress Notes (Signed)
Subjective:  Alice Castillo is a 20 y.o. G1P0 at [redacted]w[redacted]d being seen today for ongoing prenatal care.  She is currently monitored for the following issues for this high-risk pregnancy and has Major depressive disorder, recurrent, severe with psychotic features (Alice Castillo); Hallucinations; PTSD (post-traumatic stress disorder); Supervision of high risk pregnancy due to social problems; Anxiety during pregnancy, antepartum; Rh negative state in antepartum period; Family history of clotting disorder; Paresthesias in antepartum period in third trimester; Bleeding tendency (Alice Castillo); Chlamydia infection affecting pregnancy; Family history of genetic disorder; and Factor XI deficiency (Alice Castillo) on her problem list.  Patient reports no complaints. Continues to have some paresthesia in her upper abdomen. Contractions: Not present. Vag. Bleeding: None.  Movement: Present. Denies leaking of fluid.   The following portions of the patient's history were reviewed and updated as appropriate: allergies, current medications, past family history, past medical history, past social history, past surgical history and problem list. Problem list updated.  Objective:   Filed Vitals:   05/31/15 1028  BP: 111/73  Pulse: 74  Weight: 152 lb 6.4 oz (69.128 kg)    Fetal Status: Fetal Heart Rate (bpm): 150 Fundal Height: 37 cm Movement: Present  Presentation: Vertex  General:  Alert, oriented and cooperative. Patient is in no acute distress.  Skin: Skin is warm and dry. No rash noted.   Cardiovascular: Normal heart rate noted  Respiratory: Normal respiratory effort, no problems with respiration noted  Abdomen: Soft, gravid, appropriate for gestational age. Pain/Pressure: Absent     Pelvic: Vag. Bleeding: None Vag D/C Character: White   Cervical exam deferred        Extremities: Normal range of motion.  Edema: None  Mental Status: Normal mood and affect. Normal behavior. Normal judgment and thought content.   Urinalysis: Urine Protein:  1+ Urine Glucose: Negative  Assessment and Plan:  Pregnancy: G1P0 at [redacted]w[redacted]d  1. Supervision of high risk pregnancy due to social problems, third trimester Discussed contraception, given information  Term labor symptoms and general obstetric precautions including but not limited to vaginal bleeding, contractions, leaking of fluid and fetal movement were reviewed in detail with the patient. Please refer to After Visit Summary for other counseling recommendations.  Return in about 1 week (around 06/07/2015).   Venia Carbon Michiel Cowboy, CNM

## 2015-06-07 ENCOUNTER — Inpatient Hospital Stay (HOSPITAL_COMMUNITY)
Admission: AD | Admit: 2015-06-07 | Discharge: 2015-06-09 | DRG: 775 | Disposition: A | Discharge status: Home / Self Care | Payer: BLUE CROSS/BLUE SHIELD | Source: Ambulatory Visit | Attending: Family Medicine | Admitting: Family Medicine

## 2015-06-07 ENCOUNTER — Encounter (HOSPITAL_COMMUNITY): Payer: Self-pay | Admitting: *Deleted

## 2015-06-07 ENCOUNTER — Encounter: Payer: Self-pay | Admitting: Family

## 2015-06-07 DIAGNOSIS — Z6791 Unspecified blood type, Rh negative: Secondary | ICD-10-CM | POA: Diagnosis not present

## 2015-06-07 DIAGNOSIS — O99340 Other mental disorders complicating pregnancy, unspecified trimester: Secondary | ICD-10-CM

## 2015-06-07 DIAGNOSIS — Z832 Family history of diseases of the blood and blood-forming organs and certain disorders involving the immune mechanism: Secondary | ICD-10-CM | POA: Diagnosis not present

## 2015-06-07 DIAGNOSIS — Z8249 Family history of ischemic heart disease and other diseases of the circulatory system: Secondary | ICD-10-CM

## 2015-06-07 DIAGNOSIS — F4310 Post-traumatic stress disorder, unspecified: Secondary | ICD-10-CM | POA: Diagnosis present

## 2015-06-07 DIAGNOSIS — Z3A39 39 weeks gestation of pregnancy: Secondary | ICD-10-CM

## 2015-06-07 DIAGNOSIS — Z833 Family history of diabetes mellitus: Secondary | ICD-10-CM | POA: Diagnosis not present

## 2015-06-07 DIAGNOSIS — F419 Anxiety disorder, unspecified: Secondary | ICD-10-CM | POA: Diagnosis present

## 2015-06-07 DIAGNOSIS — F333 Major depressive disorder, recurrent, severe with psychotic symptoms: Secondary | ICD-10-CM | POA: Diagnosis present

## 2015-06-07 DIAGNOSIS — IMO0001 Reserved for inherently not codable concepts without codable children: Secondary | ICD-10-CM

## 2015-06-07 DIAGNOSIS — O9912 Other diseases of the blood and blood-forming organs and certain disorders involving the immune mechanism complicating childbirth: Secondary | ICD-10-CM

## 2015-06-07 DIAGNOSIS — Z823 Family history of stroke: Secondary | ICD-10-CM | POA: Diagnosis not present

## 2015-06-07 DIAGNOSIS — F418 Other specified anxiety disorders: Secondary | ICD-10-CM | POA: Diagnosis present

## 2015-06-07 DIAGNOSIS — O99344 Other mental disorders complicating childbirth: Secondary | ICD-10-CM | POA: Diagnosis present

## 2015-06-07 DIAGNOSIS — O26893 Other specified pregnancy related conditions, third trimester: Secondary | ICD-10-CM | POA: Diagnosis present

## 2015-06-07 DIAGNOSIS — O26899 Other specified pregnancy related conditions, unspecified trimester: Secondary | ICD-10-CM

## 2015-06-07 DIAGNOSIS — F329 Major depressive disorder, single episode, unspecified: Secondary | ICD-10-CM

## 2015-06-07 DIAGNOSIS — D681 Hereditary factor XI deficiency: Secondary | ICD-10-CM | POA: Diagnosis not present

## 2015-06-07 HISTORY — DX: Hereditary factor XI deficiency: D68.1

## 2015-06-07 HISTORY — DX: Headache: R51

## 2015-06-07 HISTORY — DX: Headache, unspecified: R51.9

## 2015-06-07 LAB — CBC
HEMATOCRIT: 30.4 % — AB (ref 36.0–46.0)
Hemoglobin: 10.2 g/dL — ABNORMAL LOW (ref 12.0–15.0)
MCH: 28.9 pg (ref 26.0–34.0)
MCHC: 33.6 g/dL (ref 30.0–36.0)
MCV: 86.1 fL (ref 78.0–100.0)
Platelets: 154 10*3/uL (ref 150–400)
RBC: 3.53 MIL/uL — AB (ref 3.87–5.11)
RDW: 12.6 % (ref 11.5–15.5)
WBC: 10.2 10*3/uL (ref 4.0–10.5)

## 2015-06-07 MED ORDER — LACTATED RINGERS IV SOLN
2.5000 [IU]/h | INTRAVENOUS | Status: DC
  Filled 2015-06-07: qty 4

## 2015-06-07 MED ORDER — OXYCODONE HCL 5 MG PO TABS
5.0000 mg | ORAL_TABLET | ORAL | Status: DC | PRN
  Administered 2015-06-08 – 2015-06-09 (×3): 5 mg via ORAL
  Filled 2015-06-07 (×3): qty 1

## 2015-06-07 MED ORDER — LIDOCAINE HCL (PF) 1 % IJ SOLN
30.0000 mL | INTRAMUSCULAR | Status: DC | PRN
  Administered 2015-06-07: 30 mL via SUBCUTANEOUS
  Filled 2015-06-07: qty 30

## 2015-06-07 MED ORDER — BENZOCAINE-MENTHOL 20-0.5 % EX AERO
1.0000 "application " | INHALATION_SPRAY | CUTANEOUS | Status: DC | PRN
  Administered 2015-06-07: 1 via TOPICAL
  Filled 2015-06-07 (×2): qty 56

## 2015-06-07 MED ORDER — SIMETHICONE 80 MG PO CHEW: 80.0000 mg | CHEWABLE_TABLET | ORAL | Status: DC | PRN

## 2015-06-07 MED ORDER — ACETAMINOPHEN 325 MG PO TABS
650.0000 mg | ORAL_TABLET | ORAL | Status: DC | PRN
  Administered 2015-06-08: 650 mg via ORAL
  Filled 2015-06-07: qty 2

## 2015-06-07 MED ORDER — BUTORPHANOL TARTRATE 1 MG/ML IJ SOLN
2.0000 mg | Freq: Once | INTRAMUSCULAR | Status: AC
  Administered 2015-06-07: 2 mg via INTRAVENOUS
  Filled 2015-06-07: qty 2

## 2015-06-07 MED ORDER — LANOLIN HYDROUS EX OINT: TOPICAL_OINTMENT | CUTANEOUS | Status: DC | PRN

## 2015-06-07 MED ORDER — DIBUCAINE 1 % RE OINT: 1.0000 "application " | TOPICAL_OINTMENT | RECTAL | Status: DC | PRN

## 2015-06-07 MED ORDER — LACTATED RINGERS IV BOLUS (SEPSIS)
1000.0000 mL | Freq: Once | INTRAVENOUS | Status: AC
  Administered 2015-06-07: 1000 mL via INTRAVENOUS

## 2015-06-07 MED ORDER — CITRIC ACID-SODIUM CITRATE 334-500 MG/5ML PO SOLN: 30.0000 mL | ORAL | Status: DC | PRN

## 2015-06-07 MED ORDER — LACTATED RINGERS IV SOLN: INTRAVENOUS | Status: DC

## 2015-06-07 MED ORDER — ONDANSETRON HCL 4 MG PO TABS: 4.0000 mg | ORAL_TABLET | ORAL | Status: DC | PRN

## 2015-06-07 MED ORDER — WITCH HAZEL-GLYCERIN EX PADS: 1.0000 "application " | MEDICATED_PAD | CUTANEOUS | Status: DC | PRN

## 2015-06-07 MED ORDER — SENNOSIDES-DOCUSATE SODIUM 8.6-50 MG PO TABS
2.0000 | ORAL_TABLET | ORAL | Status: DC
  Administered 2015-06-07 – 2015-06-08 (×2): 2 via ORAL
  Filled 2015-06-07 (×2): qty 2

## 2015-06-07 MED ORDER — LACTATED RINGERS IV SOLN
INTRAVENOUS | Status: DC
  Administered 2015-06-07: 14:00:00 via INTRAVENOUS

## 2015-06-07 MED ORDER — OXYCODONE-ACETAMINOPHEN 5-325 MG PO TABS: 1.0000 | ORAL_TABLET | ORAL | Status: DC | PRN

## 2015-06-07 MED ORDER — ZOLPIDEM TARTRATE 5 MG PO TABS: 5.0000 mg | ORAL_TABLET | Freq: Every evening | ORAL | Status: DC | PRN

## 2015-06-07 MED ORDER — TETANUS-DIPHTH-ACELL PERTUSSIS 5-2.5-18.5 LF-MCG/0.5 IM SUSP: 0.5000 mL | Freq: Once | INTRAMUSCULAR | Status: DC

## 2015-06-07 MED ORDER — ONDANSETRON HCL 4 MG/2ML IJ SOLN: 4.0000 mg | Freq: Four times a day (QID) | INTRAMUSCULAR | Status: DC | PRN

## 2015-06-07 MED ORDER — DIPHENHYDRAMINE HCL 25 MG PO CAPS: 25.0000 mg | ORAL_CAPSULE | Freq: Four times a day (QID) | ORAL | Status: DC | PRN

## 2015-06-07 MED ORDER — OXYCODONE-ACETAMINOPHEN 5-325 MG PO TABS: 2.0000 | ORAL_TABLET | ORAL | Status: DC | PRN

## 2015-06-07 MED ORDER — ACETAMINOPHEN 325 MG PO TABS: 650.0000 mg | ORAL_TABLET | ORAL | Status: DC | PRN

## 2015-06-07 MED ORDER — PRENATAL MULTIVITAMIN CH
1.0000 | ORAL_TABLET | Freq: Every day | ORAL | Status: DC
  Administered 2015-06-08 – 2015-06-09 (×2): 1 via ORAL
  Filled 2015-06-07 (×3): qty 1

## 2015-06-07 MED ORDER — FENTANYL CITRATE (PF) 100 MCG/2ML IJ SOLN: 100.0000 ug | Freq: Once | INTRAMUSCULAR | Status: AC

## 2015-06-07 MED ORDER — DOCUSATE SODIUM 100 MG PO CAPS
100.0000 mg | ORAL_CAPSULE | Freq: Two times a day (BID) | ORAL | Status: DC
  Administered 2015-06-08 – 2015-06-09 (×3): 100 mg via ORAL
  Filled 2015-06-07 (×4): qty 1

## 2015-06-07 MED ORDER — LACTATED RINGERS IV SOLN: 500.0000 mL | INTRAVENOUS | Status: DC | PRN

## 2015-06-07 MED ORDER — FLEET ENEMA 7-19 GM/118ML RE ENEM: 1.0000 | ENEMA | RECTAL | Status: DC | PRN

## 2015-06-07 MED ORDER — FENTANYL CITRATE (PF) 100 MCG/2ML IJ SOLN
INTRAMUSCULAR | Status: AC
  Filled 2015-06-07: qty 2

## 2015-06-07 MED ORDER — ONDANSETRON HCL 4 MG/2ML IJ SOLN: 4.0000 mg | INTRAMUSCULAR | Status: DC | PRN

## 2015-06-07 MED ORDER — OXYTOCIN BOLUS FROM INFUSION
500.0000 mL | INTRAVENOUS | Status: DC
  Administered 2015-06-07: 500 mL via INTRAVENOUS

## 2015-06-07 MED ORDER — TRANEXAMIC ACID 650 MG PO TABS (ORTHO)
1300.0000 mg | ORAL_TABLET | Freq: Three times a day (TID) | ORAL | Status: DC
  Administered 2015-06-07 – 2015-06-09 (×5): 1300 mg via ORAL
  Filled 2015-06-07 (×6): qty 2

## 2015-06-07 NOTE — H&P (Signed)
LABOR AND DELIVERY ADMISSION HISTORY AND PHYSICAL NOTE  Alice Castillo is a 20 y.o. female G1P0 with IUP at [redacted]w[redacted]d by 70 week [place] presenting for regular contractions starting at 0400 with SROM in the MAU at 1500.   She reports positive fetal movement. She denies vaginal bleeding.  No headaches, blurred vision, RUQ pain, fevers, chills, nausea, or vomiting.  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 Seen by the hematologist on 3/14 who made recommendations for delivery (see plan). Also has a history of major depression, anxiety, PTSD, suicide attempt.  Past Medical History: Past Medical History  Diagnosis Date  . Chlamydia infection   . Anxiety   . Depression   . Bipolar 1 disorder ([place])   . Headache   . Factor XI deficiency [facility])     Past Surgical History: Past Surgical History  Procedure Laterality Date  . No past surgeries      Obstetrical History: OB History    Gravida Para Term Preterm AB TAB SAB Ectopic Multiple Living   1         0      Social History: Social History   Social History  . Marital Status: Single    Spouse Name: N/A  . Number of Children: N/A  . Years of Education: N/A   Social History Main Topics  . Smoking status: Never Smoker   . Smokeless tobacco: Never Used  . Alcohol Use: 0.6 oz/week    1 Shots of liquor per week     Comment: stopped when found out pregnant  . Drug Use: No  . Sexual Activity: Not Currently    Birth Control/ Protection: None   Other Topics Concern  . None   Social History Narrative    Family History: Family History  Problem Relation Age of Onset  . Clotting disorder Father     Factor XI deficiency (affected or carrier?)  . Heart disease Maternal Uncle   . Diabetes Maternal Uncle   . Stroke Maternal Uncle   . Heart disease Paternal Uncle   . Diabetes Paternal Uncle   . Clotting disorder Paternal Uncle     Factor XI (16) deficiency  . Clotting disorder Paternal  Uncle     Factor XI deficiency  . Diabetes Maternal Grandmother   . Heart disease Maternal Grandmother   . Stroke Maternal Grandmother   . Diabetes Maternal Grandfather   . Heart disease Maternal Grandfather   . Stroke Maternal Grandfather     Allergies: Allergies  Allergen Reactions  . Food     Bananas-throat swells/shortness of breath  . Lactose Intolerance (Gi) Other (See Comments)    Stomach irritation, and diarhea    Prescriptions prior to admission  Medication Sig Dispense Refill Last Dose  . potassium chloride SA (K-DUR,KLOR-CON) 20 MEQ tablet Take 1 tablet (20 mEq total) by mouth 2 (two) times daily. (Patient not taking: Reported on 05/23/2015) 5 tablet 0 Not Taking  . Prenatal Multivit-Min-Fe-FA (PRENATAL VITAMINS) 0.8 MG tablet Take 1 tablet by mouth daily. (Patient not taking: Reported on 05/31/2015) 30 tablet 12 Not Taking     Review of Systems   All systems reviewed and negative except as stated in HPI  Blood pressure 118/73, pulse 80, temperature 97.9 F (36.6 C), temperature source Oral, resp. rate 20, last menstrual period 09/04/2014. General appearance: alert, flushed and moderate distress Lungs: clear to auscultation bilaterally Heart: regular rate and rhythm Abdomen: soft, non-tender; bowel sounds normal Extremities: No calf  swelling or tenderness Presentation: cephalic Fetal monitoring: 125-130bmp, minimal variability, no accelerations, no decelerations Uterine activity: Regular contractions every 3-4 minutes Dilation: 2 Effacement (%): 90 Exam by:: jolynn   Prenatal labs: ABO, Rh: O/NEG/-- (11/28 1632) Antibody: NEG (11/28 1632) Rubella: !Error! RPR: NON REAC (01/24 1350)  HBsAg: NEGATIVE (11/28 1632)  HIV: NONREACTIVE (01/24 1350)  GBS:   negative 1 hr Glucola: normal Genetic screening:  Quad screen negative Anatomy US: Normal  Prenatal Transfer Tool  Maternal Diabetes: No Genetic Screening: Normal Maternal Ultrasounds/Referrals:  Normal Fetal Ultrasounds or other Referrals:  None Maternal Substance Abuse:  No Significant Maternal Medications:  None Significant Maternal Lab Results: Lab values include: Rh negative  No results found for this or any previous visit (from the past 24 hour(s)).  Patient Active Problem List   Diagnosis Date Noted  . Active labor 06/07/2015  . 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 Family history of genetic disorder 04/07/2015  . Chlamydia infection affecting pregnancy 04/05/2015  . Bleeding tendency ([place]) 04/01/2015  . Rh negative state in antepartum period 03/22/2015  . Family history of clotting disorder 03/22/2015  . Paresthesias in antepartum period in third trimester 03/22/2015  . Supervision of high risk pregnancy due to social problems 01/24/2015  . Anxiety during pregnancy, antepartum 01/24/2015  . PTSD (post-traumatic stress disorder) 09/22/2014  . Major depressive disorder, recurrent, severe with psychotic features ([place]) 09/18/2014  . Hallucinations     Assessment: [name] is a 20 y.o. G1P0 at [redacted]w[redacted]d here for regular contractions starting at 0400 and SROM with clear/pink fluid in the MAU at 1500.  #Labor: Currently 2cm dilated, will monitor #Pain: Nitrous- Pt cannot have epidural because of Factor XI deficiency; Avoid NSAIDs per Hematologist's recommendations. #FWB: Category II- minimal variability, no accelerations #ID: GBS negative #MOF: Breast #MOC: Undecided #Circ: Declines  Per Hematologist's recommendations based on his note from 3/14: -No role for routine prophylaxis for daily activities in the absence of bleeding. -would consider an antifibrinolytic for heavy menstrual losses. -Would absolutely avoid any NSAIDs. -epidural anesthesia cannot be recommended without complete replacement of factor XI and might carry an increased risk of spinal hematoma.  -given the presence of only partial deficiency would not recommend prophylactic factor XI replacement  via FFP for routine vaginal delivery. However would recommend antifibrinolytics (Amicar or lysteda) for 5-7 days post-partum starting immediately after delivery to reduce the risk of postpartum hemorrhage. If any time however the patient develops uncontrollable postpartum hemorrhage in the absence of an obvious alternative etiology (uterine atony/retained POC etc) would need to have FFP replacement for factor XI correction. -if an episiotomy is necessary would need FFP replacement for FXI correction.  -If the patient needs to go for a C-section would recommend prophylactic and maintenance replacement. 10-20 ml/kg of FFP starting pre/intra-operatively and then 5-2ml/kg q24h post-operative for 3 doses. (longer if evidence of significant surgical bleeding) + Amicar 1000 mg po 4 times daily for 10-14 days  Evette Doffing 06/07/2015, 3:32 PM   CNM attestation:  I have seen and examined this patient; I agree with above documentation in the Resident's note.    Mishelle Hassan [place], CNM 7:35 PM

## 2015-06-07 NOTE — Progress Notes (Signed)
P.o. hydration

## 2015-06-07 NOTE — MAU Note (Signed)
Contractions started around 0430, now every 5 min. No bleeding or leaking. No problems with preg. Was closed when last checked.

## 2015-06-08 ENCOUNTER — Encounter (HOSPITAL_COMMUNITY): Payer: Self-pay | Admitting: *Deleted

## 2015-06-08 LAB — CBC
HCT: 27.4 % — ABNORMAL LOW (ref 36.0–46.0)
HEMATOCRIT: 28.3 % — AB (ref 36.0–46.0)
HEMOGLOBIN: 9.5 g/dL — AB (ref 12.0–15.0)
HEMOGLOBIN: 9.3 g/dL — AB (ref 12.0–15.0)
MCH: 29.5 pg (ref 26.0–34.0)
MCH: 28.7 pg (ref 26.0–34.0)
MCHC: 34.7 g/dL (ref 30.0–36.0)
MCHC: 32.9 g/dL (ref 30.0–36.0)
MCV: 85.1 fL (ref 78.0–100.0)
MCV: 87.3 fL (ref 78.0–100.0)
PLATELETS: 191 10*3/uL (ref 150–400)
Platelets: 179 10*3/uL (ref 150–400)
RBC: 3.22 MIL/uL — AB (ref 3.87–5.11)
RBC: 3.24 MIL/uL — ABNORMAL LOW (ref 3.87–5.11)
RDW: 12.4 % (ref 11.5–15.5)
RDW: 12.6 % (ref 11.5–15.5)
WBC: 13.9 10*3/uL — ABNORMAL HIGH (ref 4.0–10.5)
WBC: 11.9 10*3/uL — AB (ref 4.0–10.5)

## 2015-06-08 LAB — RPR: RPR: NONREACTIVE

## 2015-06-08 MED ORDER — RHO D IMMUNE GLOBULIN 1500 UNIT/2ML IJ SOSY
300.0000 ug | PREFILLED_SYRINGE | Freq: Once | INTRAMUSCULAR | Status: AC
  Administered 2015-06-08: 300 ug via INTRAVENOUS
  Filled 2015-06-08: qty 2

## 2015-06-08 NOTE — Progress Notes (Signed)
Pt reports tachycardia better and pain decreased

## 2015-06-08 NOTE — Progress Notes (Signed)
Post Partum Day 1 Subjective: up ad lib, voiding, tolerating PO, + flatus and endorses some vaginal soreness. She also had some dizziness with walking last night, but this has continued to improve. She has also noted some occasional gushes of blood, but she states it is about the amount of a period.  Objective: Blood pressure 104/52, pulse 80, temperature 98.7 F (37.1 C), temperature source Oral, resp. rate 18, last menstrual period 09/04/2014, SpO2 86 %, unknown if currently breastfeeding.  Physical Exam:  General: alert, cooperative and no distress Lochia: appropriate Uterine Fundus: Firm, located 3-4 cm above the umbilicus and displaced to the right Incision: n/a DVT Evaluation: No evidence of DVT seen on physical exam.   Recent Labs  06/07/15 1220 06/08/15 0517  HGB 10.2* 9.5*  HCT 30.4* 27.4*    Assessment/Plan: Will discuss ordering a CBC, given her dizziness with standing last night and her Factor 11 deficiency Continue Lysteda x 7 days postpartum, per Hematology's recommendations Plan for discharge tomorrow, Breastfeeding and Social Work consult     LOS: 1 day   Evette Doffing 06/08/2015, 7:44 AM

## 2015-06-08 NOTE — Progress Notes (Signed)
Dr Tacey Ruiz notified that pt has a racing heart.  V/S done .  Pulse rate 116. Pt rates her pain @ 7.  Pain meds given

## 2015-06-08 NOTE — Lactation Note (Signed)
This note was copied from a baby's chart. Lactation Consultation Note  Patient Name: Alice Castillo [id number] Date: 06/08/2015 Reason for consult: Initial assessment Baby 55 hours old. Mom latched baby in a modified football position. Baby latched deeply with lips flanged, suckled rhythmically with intermittent swallows noted. Enc mom to keep offering lots of STS and nursing with cues. Mom given [facility] brochure, aware of OP/BFSG and [place] phone line assistance after D/C.   Maternal Data Has patient been taught Hand Expression?: Yes Does the patient have breastfeeding experience prior to this delivery?: No  Feeding Feeding Type: Breast Fed Length of feed:  (LC assessed 10 min. of BF.)  LATCH Score/Interventions Latch: Grasps breast easily, tongue down, lips flanged, rhythmical sucking.  Audible Swallowing: Spontaneous and intermittent  Type of Nipple: Everted at rest and after stimulation  Comfort (Breast/Nipple): Soft / non-tender     Hold (Positioning): No assistance needed to correctly position infant at breast. Intervention(s): Breastfeeding basics reviewed  LATCH Score: 10  Lactation Tools Discussed/Used     Consult Status Consult Status: Follow-up Date: 06/09/15 Follow-up type: In-patient    Inocente Salles 06/08/2015, 11:34 AM

## 2015-06-08 NOTE — Clinical Social Work Maternal (Signed)
CLINICAL SOCIAL WORK MATERNAL/CHILD NOTE  Patient Details  Name: Jasani Akridge MRN: [id number] Date of Birth: 1995-05-25  Date:  06/08/2015  Clinical Social Worker Initiating Note:  Lucita Ferrara MSW, LCSW Date/ Time Initiated:  06/08/15/1145     Child's Name:  Earnestine Mealing   Legal Guardian:  [name] and Benson Norway  Need for Interpreter:  None   Date of Referral:  06/07/15     Reason for Referral:  Behavioral Health Issues, including SI    Referral Source:  [facility]   Address:  [place], [place] [address]  Phone number:  [phone]   Household Members:  Parents, Siblings   Natural Supports (not living in the home):  Spouse/significant other   Professional Supports: None   Employment: Full-time   Type of Work: Scientist, water quality at [employer]    Education:    N/A  Museum/gallery curator Resources:  [employer], Multimedia programmer   Other Resources:  [facility]   Cultural/Religious Considerations Which May Impact Care:  None reported  Strengths:  Ability to meet basic needs , Home prepared for child , Pediatrician chosen    Risk Factors/Current Problems:  Mental Health Concerns    Cognitive State:  Able to Concentrate , Alert    Mood/Affect:  Flat , Comfortable , Calm    CSW Assessment:  CSW received request for consult due to MOB presenting with a history of anxiety and depression with psychotic features, with recent admission to [facility] in July 2016.  Prior to meeting with MOB, CSW completed chart review.  On September 18, 2014, MOB arrived to [facility] ED via [facility] after [employer] found her attempting to kill herself. She had been found with one foot "off a ledge".  MOB at that time had endorsed presence of a visual hallucination, and was reporting that a friend that had died had either been telling her to kill herself or that she would kill her.  During her 5 day admission at [facility], MOB was closed, guarded, and resistant to treatment.  Per chart review, she would often not attend groups, and  expressed that she did not want to be on medications or participate in therapy at discharge.   CSW noted that on initial prenatal appointment, provider recommended medication due to symptoms of depression; however, she continued to decline medications.   MOB originally presented as closed and guarded upon CSW arrival. She stated that she was "tired", and she was not communicating with CSW. As CSW began to establish rapport with MOB, she turned her body toward CSW and began to answer questions.  MOB presents with a flattened affect, with limited range in affect noted.  MOB was noted to be smiling as she interacted with and cared for the infant.  She was holding and caring for the infant during the entire assessment.   MOB confirmed history of depression, anxiety, and PTSD.  She stated that she was first diagnosed as a small child, and shared belief that her diagnoses are accurate.  MOB shared that she has periods of time in her life when her mental health is a presenting problem, and that there are times when she does not believe that it is a problem.  MOB shared that she is currently experiencing a time where she believes that she is "okay".  She stated that she believes that it is due to having the support from the FOB, an improving relationship with her mother, and now has the infant.  MOB discussed how she tends to feel "  better" when she feels a sense of purpose and has a role in life.  She stated that she feels responsible for the infant, and a sense of obligation toward others.  MOB discussed how these factors also assist her to feel "stable".    Per MOB, during the pregnancy, she did experience symptoms of acute depression, including SI.  MOB stated that she would have a suicidal thought, but would then distract herself by shifting her focus or changing activities.  She stated that she does not like suicidal thoughts, and reported that she never felt like she wanted to act on her thoughts.  MOB  expressed confidence in her ability to self-regulate.  MOB stated that she "hates" medication, and is not interested in medication at this time.  She reported that she has previously been prescribed Prozac, but did not like how it felt to be on medication.  MOB verbalized a rigid belief that no medications will work for her based on her prior experience, and reported that she has never known anyone to have a successful experience on medication. MOB reported that she does not like medication in general, and this is not just a belief related to psychotropic medications. MOB continued to discuss preference to be independent and self-sufficient, which leads her to prefer to not talk to a therapist.  She stated that she does not tell other people about her suicidal thoughts since she does not want to feel like a "burden" or cause other people to feel responsible for her feeling depressed. MOB stated that she does not identify her inability to talk to other people about her suicidal thoughts as a problem.     CSW explored with MOB cognitive re-structuring activities to assist with depression and suicidal thoughts.  MOB was receptive to discussing the temporal nature of thoughts and feelings.  MOB was also receptive to exploring how to maintain motivation and to create a life worth living.   MOB was unable to imagine a life without depression since depressive symptoms are her baseline.  She stated that she cannot identify any potential gains of not feeling depressed, and emphasized that she will never allow anything bad to happen to the infant.  CSW attempted to assist the MOB to look forward, imagine a life with depression, and how her son would be impacted.  MOB never answered the question, and stated that she would ensure that her infant is safe.  CSW attempted to have the MOB identify potential outcomes for her infant if she died, and she reported that he would always have his grandmother.  Even with this comment,  MOB denied current SI, and stated that she is "okay".    Overall, MOB identifies with her symptoms of depression, but is not ready to engage in treatment.  MOB has strong rigid beliefs about medications and therapy, and is resistant to disengaging from these thoughts.  MOB does present with baseline knowledge about perinatal mood disorders, and recognizes that she is at an increased risk.  MOB discussed intention to closely monitor for feelings, but did not indicate any interest in receiving professional help if needed.  She again discussed her intention to utilize coping skills and her natural supports if needs arise.    CSW Plan/Description:   1. Patient/Family Education-- Perinatal mood and anxiety disorders 2. MOB presents with acute symptoms of depression and anxiety and depression, but has been resistant to treatment.  MOB denied acute symptoms at this time.  CSW recommends close mental  health follow as MOB transitions postpartum. 3. No Further Intervention Required/No Barriers to Discharge    Sheilah Mins, LCSW 06/08/2015, 2:06 PM

## 2015-06-09 LAB — RH IG WORKUP (INCLUDES ABO/RH)
ABO/RH(D): O NEG
Fetal Screen: NEGATIVE
GESTATIONAL AGE(WKS): 39
Unit division: 0

## 2015-06-09 MED ORDER — DOCUSATE SODIUM 100 MG PO CAPS: 100.0000 mg | ORAL_CAPSULE | Freq: Two times a day (BID) | ORAL | Status: DC

## 2015-06-09 MED ORDER — ACETAMINOPHEN 325 MG PO TABS: 650.0000 mg | ORAL_TABLET | ORAL | Status: DC | PRN

## 2015-06-09 MED ORDER — OXYCODONE HCL 5 MG PO TABS: 5.0000 mg | ORAL_TABLET | Freq: Four times a day (QID) | ORAL | Status: DC | PRN

## 2015-06-09 MED ORDER — TRANEXAMIC ACID 650 MG PO TABS (ORTHO): 1300.0000 mg | ORAL_TABLET | Freq: Three times a day (TID) | ORAL | Status: DC

## 2015-06-09 NOTE — Discharge Summary (Signed)
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 yo G1P1001 at [redacted]w[redacted]d was admitted in Active Labor on 06/07/2015. Patient had an uncomplicated labor course as follows:  Membrane Rupture Time/Date: 2:50 PM ,06/07/2015   Intrapartum Procedures: Episiotomy: None [1]                                         Lacerations:  3rd degree [4]  Patient had a delivery of a Viable infant. 06/07/2015  Information for the patient's newborn:  Maiven, [place]joy [id number]  Delivery Method: Vaginal, Spontaneous Delivery (Filed from Delivery Summary)   Rh negative, rhogam given.  Partial 3rd degree. Return precautions discussed. Home w/ colace and oxycodone.  Factor 11 deficiency. Started lysteda postpartum per heme recs. To finish 7-day course and f/u w/ heme as scheduled. No significant postpartum bleeding.  Hx depression. Seen by sw. Declines meds/therapy. Given PP depression return precautions.  Pateint had an uncomplicated postpartum course.  She is ambulating, tolerating a regular diet, passing flatus, and urinating well. Patient is discharged home in stable  condition on 06/09/2015.    Physical exam  Filed Vitals:   06/08/15 1753 06/08/15 2125 06/08/15 2247 06/09/15 0633  BP: 119/72 119/62  116/72  Pulse: 68 116 94 93  Temp: 98.2 F (36.8 C) 98.8 F (37.1 C)  98.7 F (37.1 C)  TempSrc: Oral Oral  Oral  Resp: 18 24 20 20   SpO2:  97% 97% 98%   General: alert, cooperative and no distress Lochia: appropriate Uterine Fundus: firm Incision: N/A DVT Evaluation: No cords or calf tenderness. No significant calf/ankle edema. Labs: Lab Results  Component Value Date   WBC 11.9* 06/08/2015   HGB 9.3* 06/08/2015   HCT 28.3* 06/08/2015   MCV 87.3 06/08/2015   PLT 179 06/08/2015   CMP Latest Ref Rng 04/01/2015  Glucose 70 - 140 mg/dl 77  BUN 7.0 - 26.0 mg/dL 4.9(L)  Creatinine 0.6 - 1.1 mg/dL 0.6  Sodium 136 - 145 mEq/L 139  Potassium 3.5 - 5.1 mEq/L 3.5  Chloride 101 - 111 mmol/L -  CO2 22 - 29 mEq/L 22  Calcium 8.4 - 10.4 mg/dL 9.1  Total Protein 6.4 - 8.3 g/dL 6.5  Total Bilirubin 0.20 - 1.20 mg/dL 0.42  Alkaline Phos 40 - 150 U/L 104  AST 5 - 34 U/L 14  ALT 0 - 55 U/L 13    Discharge instruction: per After Visit Summary and "Baby and Me Booklet".  After visit meds:    Medication List    STOP taking these medications        potassium chloride SA 20 MEQ tablet  Commonly known as:  K-DUR,KLOR-CON      TAKE these medications        acetaminophen 325 MG tablet  Commonly known as:  TYLENOL  Take 2 tablets (650 mg total) by mouth every 4 (four) hours as needed (for pain scale < 4).     docusate sodium 100 MG capsule  Commonly known as:  COLACE  Take 1 capsule (100 mg total) by mouth 2 (two) times daily.     oxyCODONE 5 MG immediate release tablet  Commonly known as:  Oxy IR/ROXICODONE  Take 1 tablet (5 mg total) by mouth every 6 (six) hours as needed for breakthrough pain.     Prenatal Vitamins 0.8 MG tablet  Take 1 tablet by mouth daily.     tranexamic acid 650 mg Tabs tablet  Commonly known as:  LYSTEDA  Take 2  tablets (1,300 mg total) by mouth every 8 (eight) hours.        Diet: routine diet  Activity: Advance as tolerated. Pelvic rest for 6 weeks.   Outpatient follow up:6 weeks Follow up Appt: Future Appointments Date Time Provider [place]  08/09/2015 3:30 PM Truitt Merle, MD [facility] None   Follow up Visit:No Follow-up on file.  Postpartum contraception: Undecided  Newborn Data: Live born female  Birth Weight: 8 lb 9.6 oz (3901 g) APGAR: 8, 9  Baby Feeding: Breast Disposition:home with mother   06/09/2015 Desma Maxim, MD

## 2015-06-09 NOTE — Lactation Note (Signed)
This note was copied from a baby's chart. Lactation Consultation Note  Patient Name: Alice Castillo [id number] Date: 06/09/2015 Reason for consult: Follow-up assessment  Baby is 43 hours old and has been consistent at the breast . 15 -30 mins , latch score 7-10.  Voids and stools QS .  Per mom breast are warmer , heavier. [place] assisted mom with a latch on the side position.  Baby latched with depth and multiply swallows noted, increased with breast compressions.  Sore nipple and engorgement prevention and tx reviewed. pe rmom has a pump at home , not sure of the name.  Mother informed of post-discharge support and given phone number to the lactation department, including services for phone call assistance; out-patient appointments; and breastfeeding support group. List of other breastfeeding resources in the community given in the handout. Encouraged mother to call for problems or concerns related to breastfeeding. Baby still feeding at 10 mins.    Maternal Data Has patient been taught Hand Expression?: Yes  Feeding Feeding Type: Breast Fed Length of feed:  (multiply swallows , and depth achieved )  LATCH Score/Interventions Latch: Grasps breast easily, tongue down, lips flanged, rhythmical sucking. Intervention(s): Adjust position;Assist with latch;Breast massage;Breast compression  Audible Swallowing: Spontaneous and intermittent  Type of Nipple: Everted at rest and after stimulation  Comfort (Breast/Nipple): Filling, red/small blisters or bruises, mild/mod discomfort  Problem noted: Filling  Hold (Positioning): Assistance needed to correctly position infant at breast and maintain latch. Intervention(s): Breastfeeding basics reviewed;Support Pillows;Position options;Skin to skin  LATCH Score: 8  Lactation Tools Discussed/Used Pump Review: Milk Storage Initiated by:: MAI  Date initiated:: 06/09/15   Consult Status Consult Status: Complete Date: 06/09/15    Myer Haff 06/09/2015, 10:16 AM

## 2015-06-09 NOTE — Discharge Instructions (Signed)

## 2015-06-10 ENCOUNTER — Telehealth: Payer: Self-pay | Admitting: Medical

## 2015-06-10 ENCOUNTER — Inpatient Hospital Stay (HOSPITAL_COMMUNITY)
Admission: AD | Admit: 2015-06-10 | Discharge: 2015-06-10 | Disposition: A | Discharge status: Home / Self Care | Payer: BLUE CROSS/BLUE SHIELD | Source: Ambulatory Visit | Attending: Obstetrics & Gynecology | Admitting: Obstetrics & Gynecology

## 2015-06-10 DIAGNOSIS — R509 Fever, unspecified: Secondary | ICD-10-CM

## 2015-06-10 DIAGNOSIS — O8612 Endometritis following delivery: Secondary | ICD-10-CM

## 2015-06-10 DIAGNOSIS — O864 Pyrexia of unknown origin following delivery: Secondary | ICD-10-CM | POA: Diagnosis not present

## 2015-06-10 LAB — CBC WITH DIFFERENTIAL/PLATELET
BASOS ABS: 0 10*3/uL (ref 0.0–0.1)
Basophils Relative: 0 %
EOS ABS: 0.2 10*3/uL (ref 0.0–0.7)
EOS PCT: 2 %
HCT: 30.7 % — ABNORMAL LOW (ref 36.0–46.0)
Hemoglobin: 10.1 g/dL — ABNORMAL LOW (ref 12.0–15.0)
LYMPHS PCT: 11 %
Lymphs Abs: 1.2 10*3/uL (ref 0.7–4.0)
MCH: 28.5 pg (ref 26.0–34.0)
MCHC: 32.9 g/dL (ref 30.0–36.0)
MCV: 86.5 fL (ref 78.0–100.0)
Monocytes Absolute: 0.8 10*3/uL (ref 0.1–1.0)
Monocytes Relative: 8 %
Neutro Abs: 8.1 10*3/uL — ABNORMAL HIGH (ref 1.7–7.7)
Neutrophils Relative %: 79 %
PLATELETS: 217 10*3/uL (ref 150–400)
RBC: 3.55 MIL/uL — AB (ref 3.87–5.11)
RDW: 12.7 % (ref 11.5–15.5)
WBC: 10.2 10*3/uL (ref 4.0–10.5)

## 2015-06-10 LAB — URINALYSIS, ROUTINE W REFLEX MICROSCOPIC
Bilirubin Urine: NEGATIVE
Glucose, UA: NEGATIVE mg/dL
Ketones, ur: NEGATIVE mg/dL
Leukocytes, UA: NEGATIVE
Nitrite: NEGATIVE
PROTEIN: NEGATIVE mg/dL
Specific Gravity, Urine: 1.01 (ref 1.005–1.030)
pH: 6.5 (ref 5.0–8.0)

## 2015-06-10 LAB — URINE MICROSCOPIC-ADD ON
BACTERIA UA: NONE SEEN
RBC / HPF: NONE SEEN RBC/hpf (ref 0–5)

## 2015-06-10 MED ORDER — METRONIDAZOLE 500 MG PO TABS: 500.0000 mg | ORAL_TABLET | Freq: Two times a day (BID) | ORAL | Status: DC

## 2015-06-10 MED ORDER — AMOXICILLIN-POT CLAVULANATE 875-125 MG PO TABS: 1.0000 | ORAL_TABLET | Freq: Two times a day (BID) | ORAL | Status: DC

## 2015-06-10 NOTE — Telephone Encounter (Signed)
Discussed patient with Dr. Ihor Dow following discharge. Concern for possible endometritis. Recommends Rx for Flagyl and Augmentin. Called patient and informed her of change in plan of care. Rx sent to pharmacy. Pharmacy of choice confirmed with patient. Patient voiced understanding and has no additional questions.   Alice Redden, PA-C 06/10/2015 3:00 PM

## 2015-06-10 NOTE — Discharge Instructions (Signed)
Breast Engorgement Breast engorgement is the overfilling of your breasts with breast milk. In the first few weeks after giving birth, you may experience breast engorgement. Although it is normal for your breasts to feel heavy, full, and uncomfortable within 3-5 days of giving birth, breast engorgement can make your breasts throb and feel hard, tightly stretched, warm, and tender. Engorgement peaks about the fifth day after you give birth. Breast engorgement can be easily treated and does not require you to stop breastfeeding.  CAUSES OF BREAST ENGORGEMENT Some women delay feedings because of sore or cracked nipples, which can lead to engorgement. Cracked and sore nipples often are caused by inadequate latching (when your baby's mouth attaches to your breast to breastfeed). If your baby is latched on properly, he or she should be able to breastfeed as long as needed, without causing any pain. If you do feel pain while breastfeeding, take your baby off your breast and try again. Get help from your health care provider or a lactation consultant if you continue to have pain. Other causes of engorgement include:   Improper position of your baby while breastfeeding.  Allowing too much time to pass between feedings.  Reduction in breastfeeding because you give your baby water, juice, formula, breast milk from a bottle, or a pacifier instead of breastfeeding.  Changes in your baby's feeding patterns.  Weak sucking from your baby, which causes less milk to be taken out of your breast during feedings.  Fatigue, stress, anemia.  Plugged milk ducts.  A history of breast surgery. SIGNS AND SYMPTOMS OF BREAST ENGORGEMENT If your breasts become engorged, you may experience:   Breast swelling, tenderness, warmth, redness, or throbbing.  Breast hardness and stretching of the skin around your breast.  Flattening, tightening, and hardening of your nipple.  A low-grade fever, which can be confused with a  breast infection. RECOMMENDATIONS TO EASE BREAST ENGORGEMENT Breast engorgement should improve in 24-48 hours after following these recommendations:  Breastfeed when you feel the need to reduce the fullness of your breasts or when your baby shows signs of hunger. This is called "breastfeeding on demand."  Newborns (babies younger than 4 weeks) often breastfeed every 1-3 hours during the day. You may need to awaken your baby to feed if he or she is asleep at a feeding time.  Do not allow your baby to sleep longer than 5 hours during the night without a feeding.  Pump or hand-express breast milk before breastfeeding to soften your breast, areola, and nipple.   Apply warm, moist heat (in the shower or with warm water-soaked hand towels) just before feeding or pumping, or massage your breast before or during breastfeeding. This increases circulation and helps your milk to flow.  Completely empty your breasts when breastfeeding or pumping. Afterward, wear a snug bra (nursing or regular) or tank top for 1-2 days to signal your body to slightly decrease milk production. Only wear snug bras or tank tops to treat engorgement. Tight bras typically should be avoided by breastfeeding mothers. Once engorgement is relieved, return to wearing regular, loose-fitting clothes.  Apply ice packs to your breasts to lessen the pain from engorgement and relieve swelling, unless the ice is uncomfortable for you.  Do not delay feedings. Try to relax when it is time to feed your baby. This helps to trigger your "let-down reflex," which releases milk from your breast.  Ensure your baby is latched on to your breast and positioned properly while breastfeeding.   Allow  your baby to remain at your breast as long as he or she is latched on well and actively sucking. Your baby will let you know when he or she is done breastfeeding by pulling away from your breast or falling asleep.  Avoid introducing bottles or pacifiers  to your baby in the early weeks of breastfeeding. Wait to introduce these things until after resolving any breastfeeding challenges.  Try to pump your milk on the same schedule as when your baby would breastfeed if you are returning to work or away from home for an extended period.  Drink plenty of fluids to avoid dehydration, which can eventually put you at greater risk of breast engorgement.  CALL YOUR HEALTH CARE PROVIDER OR LACTATION CONSULTANT IF:   Engorgement lasts longer than 2 days, even after treatment.  You have flu-like symptoms, such as a fever, chills, or body aches.  Your breasts become increasingly red and painful.   This information is not intended to replace advice given to you by your health care provider. Make sure you discuss any questions you have with your health care provider.   Document Released: 06/09/2004 Document Revised: 03/05/2014 Document Reviewed: 08/07/2012 Elsevier Interactive Patient Education [employer].

## 2015-06-10 NOTE — MAU Note (Signed)
Patient had vaginal delivery with 3rd degree repair on 06/07/15 and was sent home 06/09/15.  She presents to MAU today stating she had a fever this morning of 102.4 at home.  No nausea, vomiting, or diarrhea.  Patient states feeling a "little light headed and dizzy."

## 2015-06-10 NOTE — MAU Provider Note (Signed)
History     CSN: [id number]  Arrival date and time: 06/10/15 1106   First Provider Initiated Contact with Patient 06/10/15 1148      Chief Complaint  Patient presents with  . Fever   HPI [name] is a 20 y.o. G1P1001 PPD #3 who presents to MAU today with complaint of fever. The patient states that she woke up this morning feeling hot, nauseous and dizzy. She took her temperature and it was 102.4 F this morning. She took 2 Tylenol and put ice on her abdomen. She states mild lower abdominal pain since delivery without change. She rates pain at 2/10 now while resting, but states worse with movement. She states light bleeding and denies discharge or vaginal odor. She is breastfeeding and feels it is going well. She denies redness, pain or firmness of the breasts. She also denies UTI symptoms, vomiting, diarrhea today.   OB History as of 06/09/15    Gravida Para Term Preterm AB TAB SAB Ectopic Multiple Living   1 1 1       0 1      Past Medical History  Diagnosis Date  . Chlamydia infection   . Anxiety   . Depression   . Bipolar 1 disorder ([place])   . Headache   . 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 No past surgeries      Family History  Problem Relation Age of Onset  . Clotting disorder Father     Factor XI deficiency (affected or carrier?)  . Heart disease Maternal Uncle   . Diabetes Maternal Uncle   . Stroke Maternal Uncle   . Heart disease Paternal Uncle   . Diabetes Paternal Uncle   . Clotting disorder Paternal Uncle     Factor XI (38) deficiency  . Clotting disorder Paternal Uncle     Factor XI deficiency  . Diabetes Maternal Grandmother   . Heart disease Maternal Grandmother   . Stroke Maternal Grandmother   . Diabetes Maternal Grandfather   . Heart disease Maternal Grandfather   . Stroke Maternal Grandfather     Social History  Substance Use Topics  . Smoking status: Never Smoker   . Smokeless  tobacco: Never Used  . Alcohol Use: 0.6 oz/week    1 Shots of liquor per week     Comment: stopped when found out pregnant    Allergies:  Allergies  Allergen Reactions  . Food     Bananas-throat swells/shortness of breath  . Lactose Intolerance (Gi) Other (See Comments)    Stomach irritation, and diarhea    Prescriptions prior to admission  Medication Sig Dispense Refill Last Dose  . acetaminophen (TYLENOL) 325 MG tablet Take 2 tablets (650 mg total) by mouth every 4 (four) hours as needed (for pain scale < 4). 90 tablet 3 06/10/2015 at 0830  . docusate sodium (COLACE) 100 MG capsule Take 1 capsule (100 mg total) by mouth 2 (two) times daily. (Patient not taking: Reported on 06/10/2015) 60 capsule 1 Not Taking at Unknown time  . oxyCODONE (OXY IR/ROXICODONE) 5 MG immediate release tablet Take 1 tablet (5 mg total) by mouth every 6 (six) hours as needed for breakthrough pain. (Patient not taking: Reported on 06/10/2015) 30 tablet 0 Not Taking at Unknown time  . tranexamic acid (LYSTEDA) 650 mg TABS tablet Take 2 tablets (1,300 mg total) by mouth every 8 (eight) hours. (Patient not taking: Reported on 06/10/2015) 16  tablet 0 Not Taking at Unknown time    Review of Systems  Constitutional: Positive for fever. Negative for malaise/fatigue.  Gastrointestinal: Positive for nausea and abdominal pain. Negative for vomiting, diarrhea and constipation.  Genitourinary: Negative for dysuria, urgency and frequency.       + vaginal bleeding Neg - vaginal discharge, odor   Physical Exam   Blood pressure 105/79, pulse 97, temperature 98.3 F (36.8 C), temperature source Oral, resp. rate 16, last menstrual period 09/04/2014, SpO2 99 %, unknown if currently breastfeeding.  Physical Exam  Nursing note and vitals reviewed. Constitutional: She is oriented to person, place, and time. She appears well-developed and well-nourished. No distress.  HENT:  Head: Normocephalic and atraumatic.  Cardiovascular:  Normal rate.   Respiratory: Effort normal. Right breast exhibits no inverted nipple, no mass, no nipple discharge, no skin change and no tenderness. Left breast exhibits no inverted nipple, no mass, no nipple discharge, no skin change and no tenderness. Breasts are symmetrical.  Mild firmness of both breasts, non-tender to palpation  GI: Soft. She exhibits no distension and no mass. There is tenderness (mild tenderness to palpation of the lower abdomen). There is no rebound and no guarding.  Neurological: She is alert and oriented to person, place, and time.  Skin: Skin is warm and dry. No erythema.  Psychiatric: She has a normal mood and affect.   Results for orders placed or performed during the hospital encounter of 06/10/15 (from the past 24 hour(s))  Urinalysis, Routine w reflex microscopic (not at [facility])     Status: Abnormal   Collection Time: 06/10/15 11:08 AM  Result Value Ref Range   Color, Urine YELLOW YELLOW   APPearance CLEAR CLEAR   Specific Gravity, Urine 1.010 1.005 - 1.030   pH 6.5 5.0 - 8.0   Glucose, UA NEGATIVE NEGATIVE mg/dL   Hgb urine dipstick TRACE (A) NEGATIVE   Bilirubin Urine NEGATIVE NEGATIVE   Ketones, ur NEGATIVE NEGATIVE mg/dL   Protein, ur NEGATIVE NEGATIVE mg/dL   Nitrite NEGATIVE NEGATIVE   Leukocytes, UA NEGATIVE NEGATIVE  Urine microscopic-add on     Status: Abnormal   Collection Time: 06/10/15 11:08 AM  Result Value Ref Range   Squamous Epithelial / LPF 0-5 (A) NONE SEEN   WBC, UA 0-5 0 - 5 WBC/hpf   RBC / HPF NONE SEEN 0 - 5 RBC/hpf   Bacteria, UA NONE SEEN NONE SEEN  CBC with Differential/Platelet     Status: Abnormal   Collection Time: 06/10/15 12:03 PM  Result Value Ref Range   WBC 10.2 4.0 - 10.5 K/uL   RBC 3.55 (L) 3.87 - 5.11 MIL/uL   Hemoglobin 10.1 (L) 12.0 - 15.0 g/dL   HCT 30.7 (L) 36.0 - 46.0 %   MCV 86.5 78.0 - 100.0 fL   MCH 28.5 26.0 - 34.0 pg   MCHC 32.9 30.0 - 36.0 g/dL   RDW 12.7 11.5 - 15.5 %   Platelets 217 150 - 400 K/uL    Neutrophils Relative % 79 %   Neutro Abs 8.1 (H) 1.7 - 7.7 K/uL   Lymphocytes Relative 11 %   Lymphs Abs 1.2 0.7 - 4.0 K/uL   Monocytes Relative 8 %   Monocytes Absolute 0.8 0.1 - 1.0 K/uL   Eosinophils Relative 2 %   Eosinophils Absolute 0.2 0.0 - 0.7 K/uL   Basophils Relative 0 %   Basophils Absolute 0.0 0.0 - 0.1 K/uL    MAU Course  Procedures None  MDM  Cath UA and CBC today Discussed with Dr. Ihor Dow. She recommends continued breastfeeding and monitor temperature. If fever resumes call the office or return to MAU.  Patient advised to pump while in MAU. Able to produce a lot of milk. Feeling well afterwards. Remains afebrile.  Assessment and Plan  A: PPD #3 Postpartum fever  P: Discharge home Tylenol and Percocet as previously advised Warning signs for worsening condition discussed Patient advised to follow-up with [facility] as scheduled for routine postpartum visit or sooner PRN Patient may return to MAU as needed or if her condition were to change or worsen   Luvenia Redden, PA-C  06/10/2015, 1:09 PM

## 2015-06-11 LAB — TYPE AND SCREEN
ABO/RH(D): O NEG
Antibody Screen: POSITIVE
DAT, IgG: NEGATIVE
UNIT DIVISION: 0
Unit division: 0

## 2015-06-16 ENCOUNTER — Encounter: Payer: Self-pay | Admitting: Obstetrics and Gynecology

## 2015-07-12 ENCOUNTER — Ambulatory Visit (INDEPENDENT_AMBULATORY_CARE_PROVIDER_SITE_OTHER): Payer: BLUE CROSS/BLUE SHIELD | Admitting: Student

## 2015-07-12 ENCOUNTER — Encounter: Payer: Self-pay | Admitting: Student

## 2015-07-12 DIAGNOSIS — F53 Postpartum depression: Secondary | ICD-10-CM

## 2015-07-12 DIAGNOSIS — K5909 Other constipation: Secondary | ICD-10-CM

## 2015-07-12 DIAGNOSIS — O99345 Other mental disorders complicating the puerperium: Secondary | ICD-10-CM

## 2015-07-12 MED ORDER — DOCUSATE SODIUM 100 MG PO CAPS: 100.0000 mg | ORAL_CAPSULE | Freq: Two times a day (BID) | ORAL | Status: DC

## 2015-07-12 NOTE — Progress Notes (Signed)
Patient ID: Alice Castillo, female   DOB: [birth year], 20 y.o.   MRN: [id number] Subjective:     Alice Castillo is a 20 y.o. female who presents for a postpartum visit. She is 5 weeks postpartum following a spontaneous vaginal delivery. I have fully reviewed the prenatal and intrapartum course. The delivery was at 25 gestational weeks. Outcome: spontaneous vaginal delivery. Anesthesia: none. Postpartum course has been:seen for fever & treated for possible endometritis. Baby's course has been normal. Baby is feeding by breast. Bleeding pink. Bowel function is normal. Bladder function is normal. Patient is not sexually active. Contraception method is none. Postpartum depression screening: positive.  The following portions of the patient's history were reviewed and updated as appropriate: allergies, current medications, past family history, past medical history, past social history, past surgical history and problem list.  Review of Systems Constitutional: negative Respiratory: negative Cardiovascular: negative Gastrointestinal: positive for constipation and d/t fear of laceration pain Genitourinary:positive for discomfort at site of repair esp when straining for BM Integument/breast: negative Behavioral/Psych: positive for depression and sleep disturbance . Negative for SI/HI  Objective:    BP 109/74 mmHg  Pulse 82  Temp(Src) 98.3 F (36.8 C)  Ht 5\' 6"  (1.676 m)  Wt 124 lb (56.246 kg)  BMI 20.02 kg/m2  Breastfeeding? Yes  General:  alert, cooperative, appears stated age and no distress   Breasts:  negative  Lungs: clear to auscultation bilaterally  Heart:  regular rate and rhythm, S1, S2 normal, no murmur, click, rub or gallop  Abdomen: soft, non-tender; bowel sounds normal; no masses,  no organomegaly   Vulva:  normal  Vagina: repair intact, no evidence of infection        Assessment:     Normal postpartum exam. Pap smear not done at today's visit.   Plan:    1. Contraception: none     -contraception information provided 2. Postpartum depression  -patient has history of depression -declines medication or referral for CBT at this time -phone numbers given for support 3.Constipation -Discussed at home treatmetn -Rx colace 4. Follow up in: 1 year or as needed.     Jorje Guild, NP

## 2015-07-12 NOTE — Patient Instructions (Signed)
Vaginal Laceration A vaginal laceration is a tear in the vaginal wall. A vaginal tear falls into one of three categories:  1. Obstetrically related tears that occur at the time of childbirth. 2. Trauma-related tears (most often related to sexual intercourse). 3. Spontaneous tears. Vaginal tears can cause heavy bleeding (hemorrhaging) depending on severity of the tear. Tears can be intensely tender and interfere with normal activities of living. They can make sexual intercourse painful and bring on significant burning with urination. If you have a vaginal laceration, the area around your vagina may be painful when you touch or wipe it. Even light pressure from clothing may cause some pain. Vaginal tear need to be evaluated by your caregiver.  CAUSES   Obstetric-related causes, such as childbirth.  Trauma that may result from an accident during an activity, such as sexual intercourse or a bicycle ride.  Spontaneous causes related to aging, failed healing of a past obstetric tear, chronic irritation, or skin changes that are not well understood. SYMPTOMS   Slight to heavy vaginal bleeding.  Vaginal swelling.  Mild to severe pain.  Vaginal tenderness. DIAGNOSIS  If the tear happened during childbirth, your caregiver can diagnose the tear at that time. To diagnose a vaginal tear that happened spontaneously or because of trauma, your caregiver will perform a physical exam. During the physical exam, your caregiver may also look for any signs of trouble that may need further testing. If there is hemorrhaging, your caregiver may suggest blood tests to determine the extent of bleeding. Imaging tests may be performed, such as an ultrasonography or computed tomography (CT), to look for internal damage. A biopsy may be need if there are signs of a more serious problem.  TREATMENT  Treatment depends on the severity of the tear. For minor tears that heal on their own, treatment may only consist of keeping  the area clean and dry. Some tears need to be repaired with stitches. Other tears may heal on their own with help from various remedies, such as antibiotic ointments, medicated creams, or petroleum products. Depending on the circumstances, oral hormones may also be suggested. Hormone remedies may also be in the form of topical creams and vaginal tablets. For more concerning situations, hospitalization and surgical repair of the tear may be needed. HOME CARE INSTRUCTIONS   Take warm-water baths that cover your hips and buttocks (sitz bath) 2 to 3 times a day. This may help any discomfort and swelling.   Only take over-the-counter or prescription medicines for pain, discomfort, or fever as directed by your caregiver. Do not use aspirin because it can cause increased bleeding.   Do not douche, use tampons, or have intercourse until your caregiver says it is okay.   A bandage (dressing) may have been applied. Change the dressing once a day or as directed. If the dressing sticks, soak it off with warm, soapy water.   Apply ice or witch hazel pads to the vagina to lessen any pain or discomfort.   Take a stool softener or follow a special diet as directed by your caregiver. This will help ease discomfort associated with bowel movements.  SEEK IMMEDIATE MEDICAL CARE IF:   You have redness or swelling in the vaginal area.   You have increasing, sharp, or intense pain or tenderness in the vaginal area.  You have pus or unusual discharge coming from the tear or vagina.   You notice a bad smell coming from the vagina.   Your tear breaks open after  it healed or was repaired.   You feel lightheaded.  You have increasing abdominal pain.   You have an increasing or heavy amount of vaginal bleeding.   You have pain with intercourse after the tear heals.  MAKE SURE YOU:  Understand these instructions.  Will watch your condition.  Will get help right away if you are not doing well  or get worse.   This information is not intended to replace advice given to you by your health care provider. Make sure you discuss any questions you have with your health care provider.   Document Released: 02/12/2005 Document Revised: 11/07/2011 Document Reviewed: 07/02/2011 Elsevier Interactive Patient Education [employer].   Contraception Choices Contraception (birth control) is the use of any methods or devices to prevent pregnancy. Below are some methods to help avoid pregnancy. HORMONAL METHODS  4. Contraceptive implant. This is a thin, plastic tube containing progesterone hormone. It does not contain estrogen hormone. Your health care provider inserts the tube in the inner part of the upper arm. The tube can remain in place for up to 3 years. After 3 years, the implant must be removed. The implant prevents the ovaries from releasing an egg (ovulation), thickens the cervical mucus to prevent sperm from entering the uterus, and thins the lining of the inside of the uterus. 5. Progesterone-only injections. These injections are given every 3 months by your health care provider to prevent pregnancy. This synthetic progesterone hormone stops the ovaries from releasing eggs. It also thickens cervical mucus and changes the uterine lining. This makes it harder for sperm to survive in the uterus. 6. Birth control pills. These pills contain estrogen and progesterone hormone. They work by preventing the ovaries from releasing eggs (ovulation). They also cause the cervical mucus to thicken, preventing the sperm from entering the uterus. Birth control pills are prescribed by a health care provider.Birth control pills can also be used to treat heavy periods. 7. Minipill. This type of birth control pill contains only the progesterone hormone. They are taken every day of each month and must be prescribed by your health care provider. 8. Birth control patch. The patch contains hormones similar to those  in birth control pills. It must be changed once a week and is prescribed by a health care provider. 9. Vaginal ring. The ring contains hormones similar to those in birth control pills. It is left in the vagina for 3 weeks, removed for 1 week, and then a new one is put back in place. The patient must be comfortable inserting and removing the ring from the vagina.A health care provider's prescription is necessary. 10. Emergency contraception. Emergency contraceptives prevent pregnancy after unprotected sexual intercourse. This pill can be taken right after sex or up to 5 days after unprotected sex. It is most effective the sooner you take the pills after having sexual intercourse. Most emergency contraceptive pills are available without a prescription. Check with your pharmacist. Do not use emergency contraception as your only form of birth control. BARRIER METHODS   Female condom. This is a thin sheath (latex or rubber) that is worn over the penis during sexual intercourse. It can be used with spermicide to increase effectiveness.  Female condom. This is a soft, loose-fitting sheath that is put into the vagina before sexual intercourse.  Diaphragm. This is a soft, latex, dome-shaped barrier that must be fitted by a health care provider. It is inserted into the vagina, along with a spermicidal jelly. It is inserted before  intercourse. The diaphragm should be left in the vagina for 6 to 8 hours after intercourse.  Cervical cap. This is a round, soft, latex or plastic cup that fits over the cervix and must be fitted by a health care provider. The cap can be left in place for up to 48 hours after intercourse.  Sponge. This is a soft, circular piece of polyurethane foam. The sponge has spermicide in it. It is inserted into the vagina after wetting it and before sexual intercourse.  Spermicides. These are chemicals that kill or block sperm from entering the cervix and uterus. They come in the form of creams,  jellies, suppositories, foam, or tablets. They do not require a prescription. They are inserted into the vagina with an applicator before having sexual intercourse. The process must be repeated every time you have sexual intercourse. INTRAUTERINE CONTRACEPTION  Intrauterine device (IUD). This is a T-shaped device that is put in a woman's uterus during a menstrual period to prevent pregnancy. There are 2 types:  Copper IUD. This type of IUD is wrapped in copper wire and is placed inside the uterus. Copper makes the uterus and fallopian tubes produce a fluid that kills sperm. It can stay in place for 10 years.  Hormone IUD. This type of IUD contains the hormone progestin (synthetic progesterone). The hormone thickens the cervical mucus and prevents sperm from entering the uterus, and it also thins the uterine lining to prevent implantation of a fertilized egg. The hormone can weaken or kill the sperm that get into the uterus. It can stay in place for 3-5 years, depending on which type of IUD is used. PERMANENT METHODS OF CONTRACEPTION  Female tubal ligation. This is when the woman's fallopian tubes are surgically sealed, tied, or blocked to prevent the egg from traveling to the uterus.  Hysteroscopic sterilization. This involves placing a small coil or insert into each fallopian tube. Your doctor uses a technique called hysteroscopy to do the procedure. The device causes scar tissue to form. This results in permanent blockage of the fallopian tubes, so the sperm cannot fertilize the egg. It takes about 3 months after the procedure for the tubes to become blocked. You must use another form of birth control for these 3 months.  Female sterilization. This is when the female has the tubes that carry sperm tied off (vasectomy).This blocks sperm from entering the vagina during sexual intercourse. After the procedure, the man can still ejaculate fluid (semen). NATURAL PLANNING METHODS  Natural family planning.  This is not having sexual intercourse or using a barrier method (condom, diaphragm, cervical cap) on days the woman could become pregnant.  Calendar method. This is keeping track of the length of each menstrual cycle and identifying when you are fertile.  Ovulation method. This is avoiding sexual intercourse during ovulation.  Symptothermal method. This is avoiding sexual intercourse during ovulation, using a thermometer and ovulation symptoms.  Post-ovulation method. This is timing sexual intercourse after you have ovulated. Regardless of which type or method of contraception you choose, it is important that you use condoms to protect against the transmission of sexually transmitted infections (STIs). Talk with your health care provider about which form of contraception is most appropriate for you.   This information is not intended to replace advice given to you by your health care provider. Make sure you discuss any questions you have with your health care provider.   Document Released: 02/12/2005 Document Revised: 02/17/2013 Document Reviewed: 08/07/2012 Elsevier Interactive Patient Education  2016 [employer].

## 2015-08-04 ENCOUNTER — Other Ambulatory Visit: Payer: Self-pay | Admitting: Hematology

## 2015-08-09 ENCOUNTER — Ambulatory Visit (HOSPITAL_BASED_OUTPATIENT_CLINIC_OR_DEPARTMENT_OTHER): Payer: BLUE CROSS/BLUE SHIELD

## 2015-08-09 ENCOUNTER — Ambulatory Visit (HOSPITAL_BASED_OUTPATIENT_CLINIC_OR_DEPARTMENT_OTHER): Payer: BLUE CROSS/BLUE SHIELD | Admitting: Hematology

## 2015-08-09 ENCOUNTER — Encounter: Payer: Self-pay | Admitting: Hematology

## 2015-08-09 ENCOUNTER — Ambulatory Visit: Payer: Self-pay | Admitting: Hematology

## 2015-08-09 ENCOUNTER — Telehealth: Payer: Self-pay | Admitting: Hematology

## 2015-08-09 VITALS — BP 115/89 | HR 71 | Temp 98.4°F | Resp 20 | Ht 66.0 in | Wt 129.0 lb

## 2015-08-09 DIAGNOSIS — D681 Hereditary factor XI deficiency: Secondary | ICD-10-CM

## 2015-08-09 DIAGNOSIS — N939 Abnormal uterine and vaginal bleeding, unspecified: Secondary | ICD-10-CM

## 2015-08-09 LAB — COMPREHENSIVE METABOLIC PANEL
ALBUMIN: 4 g/dL (ref 3.5–5.0)
ALK PHOS: 114 U/L (ref 40–150)
ALT: 41 U/L (ref 0–55)
ANION GAP: 8 meq/L (ref 3–11)
AST: 23 U/L (ref 5–34)
BILIRUBIN TOTAL: 0.34 mg/dL (ref 0.20–1.20)
BUN: 9.1 mg/dL (ref 7.0–26.0)
CALCIUM: 9.6 mg/dL (ref 8.4–10.4)
CO2: 26 meq/L (ref 22–29)
CREATININE: 0.7 mg/dL (ref 0.6–1.1)
Chloride: 108 mEq/L (ref 98–109)
EGFR: >90 mL/min/{1.73_m2} (ref >90)
Glucose: 83 mg/dl (ref 70–140)
Potassium: 3.8 mEq/L (ref 3.5–5.1)
Sodium: 142 mEq/L (ref 136–145)
TOTAL PROTEIN: 7.7 g/dL (ref 6.4–8.3)

## 2015-08-09 LAB — CBC & DIFF AND RETIC
BASO%: 0.3 % (ref 0.0–2.0)
Basophils Absolute: 0 10*3/uL (ref 0.0–0.1)
EOS%: 4 % (ref 0.0–7.0)
Eosinophils Absolute: 0.2 10*3/uL (ref 0.0–0.5)
HCT: 37.1 % (ref 34.8–46.6)
HGB: 12.4 g/dL (ref 11.6–15.9)
IMMATURE RETIC FRACT: 5.9 % (ref 1.60–10.00)
LYMPH%: 37.4 % (ref 14.0–49.7)
MCH: 27.9 pg (ref 25.1–34.0)
MCHC: 33.4 g/dL (ref 31.5–36.0)
MCV: 83.4 fL (ref 79.5–101.0)
MONO#: 0.5 10*3/uL (ref 0.1–0.9)
MONO%: 8.8 % (ref 0.0–14.0)
NEUT%: 49.5 % (ref 38.4–76.8)
NEUTROS ABS: 3 10*3/uL (ref 1.5–6.5)
PLATELETS: 251 10*3/uL (ref 145–400)
RBC: 4.45 10*6/uL (ref 3.70–5.45)
RDW: 12.6 % (ref 11.2–14.5)
Retic %: 1.18 % (ref 0.70–2.10)
Retic Ct Abs: 52.51 10*3/uL (ref 33.70–90.70)
WBC: 6 10*3/uL (ref 3.9–10.3)
lymph#: 2.2 10*3/uL (ref 0.9–3.3)

## 2015-08-09 NOTE — Telephone Encounter (Signed)
per pof to sch pt appt-sent pt back to lab-gave pt copy of avs °

## 2015-08-10 LAB — FACTOR 11 ASSAY: FACTOR XI ACTIVITY: 51 % — AB (ref 60–150)

## 2015-08-16 NOTE — Progress Notes (Signed)
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   HISTORY OF PRESENTING ILLNESS: please see my initial consultation for details on initial presentation.  INTERVAL HISTORY  Ms Warr is here for discussion for her scheduled follow-up for factor XI deficiency. She had an uneventful full-term Vaginal delivery about 2 months ago and is here with her 101 month old son. She did have a perineal laceration during childbirth which required repair. She reports that she was discharged on Lysteda for a little over a week from the hospital. Has had some minimal postpartum discharge with some blood. Recently had her first period which she thought was heavier. On examination with my RN as chaperone today there was no evidence of  vaginal bleeding and her perineal njury seems have resolved completely.   MEDICAL HISTORY:  Past Medical History  Diagnosis Date  . Chlamydia infection   . Anxiety   . Depression   . Bipolar 1 disorder ([place])   . Headache   . 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 No past surgeries      SOCIAL HISTORY: Social History   Social History  . Marital Status: Single    Spouse Name: N/A  . Number of Children: N/A  . Years of Education: N/A   Occupational History  . Not on file.   Social History Main Topics  . Smoking status: Never Smoker   . Smokeless tobacco: Never Used  . Alcohol Use: 0.6 oz/week    1 Shots of liquor per week     Comment: stopped when found out pregnant  . Drug Use: No  . Sexual Activity: Not Currently    Birth Control/ Protection: None   Other Topics Concern  . Not on file   Social History Narrative    FAMILY HISTORY: Family History  Problem Relation Age of Onset  .  Clotting disorder Father     Factor XI deficiency (affected or carrier?)  . Heart disease Maternal Uncle   . Diabetes Maternal Uncle   . Stroke Maternal Uncle   . Heart disease Paternal Uncle   . Diabetes Paternal Uncle   . Clotting disorder Paternal Uncle     Factor XI (67) deficiency  . Clotting disorder Paternal Uncle     Factor XI deficiency  . Diabetes Maternal Grandmother   . Heart disease Maternal Grandmother   . Stroke Maternal Grandmother   . Diabetes Maternal Grandfather   . Heart disease Maternal Grandfather   . Stroke Maternal Grandfather     ALLERGIES:  is allergic to food and lactose intolerance (gi).  MEDICATIONS:  Current Outpatient Prescriptions  Medication Sig Dispense Refill  . acetaminophen (TYLENOL) 325 MG tablet Take 2 tablets (650 mg total) by mouth every 4 (four) hours as needed (for pain scale < 4). 90 tablet 3  . docusate sodium (COLACE) 100 MG capsule Take 1 capsule (100 mg total) by mouth 2 (two) times daily. (Patient not taking: Reported on 08/09/2015) 20 capsule 0  . tranexamic acid (LYSTEDA) 650 mg TABS tablet Take 2 tablets (1,300 mg total) by mouth every 8 (eight) hours. (Patient not taking: Reported on 06/10/2015) 16 tablet 0   No current facility-administered medications for this visit.    REVIEW OF  SYSTEMS:    10 Point review of Systems was done is negative except as noted above.  PHYSICAL EXAMINATION: ECOG PERFORMANCE STATUS: 0 - Asymptomatic  . Filed Vitals:   08/09/15 1442  BP: 115/89  Pulse: 71  Temp: 98.4 F (36.9 C)  Resp: 20   Filed Weights   08/09/15 1442  Weight: 129 lb (58.514 kg)   .Body mass index is 20.83 kg/(m^2).  GENERAL:alert, in no acute distress and comfortable SKIN: skin color, texture, turgor are normal, no rashes or significant lesions EYES: normal, conjunctiva are pink and non-injected, sclera clear OROPHARYNX:no exudate, no erythema and lips, buccal mucosa, and tongue normal  NECK: supple, no JVD,  thyroid normal size, non-tender, without nodularity LYMPH:  no palpable lymphadenopathy in the cervical, axillary or inguinal LUNGS: clear to auscultation with normal respiratory effort HEART: regular rate & rhythm,  no murmurs and no lower extremity edema ABDOMEN: abdomen soft, non-tender, normoactive bowel sounds . No evidence of vaginal or perineal bleeding. Musculoskeletal: no cyanosis of digits and no clubbing  PSYCH: alert & oriented x 3 with fluent speech NEURO: no focal motor/sensory deficits  LABORATORY DATA:  I have reviewed the data as listed  . CBC Latest Ref Rng 08/09/2015 06/10/2015 06/08/2015  WBC 3.9 - 10.3 10e3/uL 6.0 10.2 11.9(H)  Hemoglobin 11.6 - 15.9 g/dL 12.4 10.1(L) 9.3(L)  Hematocrit 34.8 - 46.6 % 37.1 30.7(L) 28.3(L)  Platelets 145 - 400 10e3/uL 251 217 179   . CBC    Component Value Date/Time   WBC 6.0 08/09/2015 1615   WBC 10.2 06/10/2015 1203   RBC 4.45 08/09/2015 1615   RBC 3.55* 06/10/2015 1203   HGB 12.4 08/09/2015 1615   HGB 10.1* 06/10/2015 1203   HCT 37.1 08/09/2015 1615   HCT 30.7* 06/10/2015 1203   PLT 251 08/09/2015 1615   PLT 217 06/10/2015 1203   MCV 83.4 08/09/2015 1615   MCV 86.5 06/10/2015 1203   MCH 27.9 08/09/2015 1615   MCH 28.5 06/10/2015 1203   MCHC 33.4 08/09/2015 1615   MCHC 32.9 06/10/2015 1203   RDW 12.6 08/09/2015 1615   RDW 12.7 06/10/2015 1203   LYMPHSABS 2.2 08/09/2015 1615   LYMPHSABS 1.2 06/10/2015 1203   MONOABS 0.5 08/09/2015 1615   MONOABS 0.8 06/10/2015 1203   EOSABS 0.2 08/09/2015 1615   EOSABS 0.2 06/10/2015 1203   BASOSABS 0.0 08/09/2015 1615   BASOSABS 0.0 06/10/2015 1203   . CMP Latest Ref Rng 08/09/2015 04/01/2015 01/15/2015  Glucose 70 - 140 mg/dl 83 77 86  BUN 7.0 - 26.0 mg/dL 9.1 4.9(L) <5(L)  Creatinine 0.6 - 1.1 mg/dL 0.7 0.6 0.45  Sodium 136 - 145 mEq/L 142 139 135  Potassium 3.5 - 5.1 mEq/L 3.8 3.5 3.4(L)  Chloride 101 - 111 mmol/L - - 104  CO2 22 - 29 mEq/L 26 22 22   Calcium 8.4 - 10.4  mg/dL 9.6 9.1 9.2  Total Protein 6.4 - 8.3 g/dL 7.7 6.5 6.5  Total Bilirubin 0.20 - 1.20 mg/dL 0.34 0.42 0.3  Alkaline Phos 40 - 150 U/L 114 104 52  AST 5 - 34 U/L 23 14 16   ALT 0 - 55 U/L 41 13 14     RADIOGRAPHIC STUDIES: I have personally reviewed the radiological images as listed and agreed with the findings in the report. No results found.  ASSESSMENT & PLAN:   20 year old Caucasian female with a family history of possible factor XI deficiency in her father and paternal uncle.  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 Factor XI levels today are 51%. She did not have significant bleeding during her vaginal delivery except with some bleeding due to her perineal injuries requiring sutures. Was on Lysteda for about 8 days after discharge from the hospital. No significant vaginal or perineal bleeding noted on examination today. Patient reports she had her first period after her delivery recently and it was somewhat heavier.  Plan -Patient's hemoglobin is stable and no evidence of active bleeding at this time. -Patient is actively breast-feeding at this point and therefore [place] and Lysteda would not be recommended. -We discussed that if the patient has significantly heavy periods going ahead that she contact her gynecologist to discuss other methods to try to regulate her periods. -Continue follow-up with primary care physician and gynecologist .    RTC with Dr Irene Limbo in 3 months for re-evaluation of menstrual losses or other bleeding issues  All of the patients questions were answered with apparent satisfaction. The patient knows to call the clinic with any problems, questions or concerns.  I spent 15 minutes counseling the patient face to face. The total time spent in the appointment was 20 minutes and more than 50% was on counseling and direct  patient cares.    Sullivan Lone MD [place] AAHIVMS [facility] Hematology/Oncology Physician [facility]  (Office):       [phone] (Work cell):  [phone] (Fax):           [phone]

## 2015-11-08 ENCOUNTER — Other Ambulatory Visit: Payer: Self-pay

## 2015-11-08 ENCOUNTER — Ambulatory Visit (HOSPITAL_BASED_OUTPATIENT_CLINIC_OR_DEPARTMENT_OTHER): Payer: BLUE CROSS/BLUE SHIELD | Admitting: Hematology

## 2015-11-08 ENCOUNTER — Encounter: Payer: Self-pay | Admitting: Hematology

## 2015-11-08 VITALS — BP 95/69 | HR 85 | Temp 98.3°F | Resp 17 | Ht 66.0 in | Wt 129.0 lb

## 2015-11-08 DIAGNOSIS — D681 Hereditary factor XI deficiency: Secondary | ICD-10-CM

## 2015-11-13 NOTE — Progress Notes (Signed)
Alice Castillo    HEMATOLOGY/ONCOLOGY CLINIC NOTE  Date of Service: 11/08/2015   OB Gyn: Alice Castillo, CNM and Alice Castillo, CNM CNM  CHIEF COMPLAINTS/PURPOSE OF CONSULTATION:   F/u for Partial Factor XI deficiency.   HISTORY OF PRESENTING ILLNESS: please see my initial consultation for details on initial presentation.  INTERVAL HISTORY  Alice Castillo is here for discussion for her scheduled follow-up for partial factor XI deficiency. She  is here for her scheduled followup to evaluate for other bleeding issues or heavy menses. She notes that she has not started having regular periods since her delivery.  And she is here with her son who is doing well.  She notes minimal rectal bleeding on tissue paper on one occasion after she passed some hard stools.  She reports that she is establishing primary care with Dr. Silver Huguenin who is also primary care physician for her mother. He reports no other acute new symptoms.  MEDICAL HISTORY:  Past Medical History:  Diagnosis Date  . Anxiety   . Bipolar 1 disorder ([place])   . Chlamydia infection   . Depression   . 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 Headache     SURGICAL HISTORY: Past Surgical History:  Procedure Laterality Date  . NO PAST SURGERIES      SOCIAL HISTORY: Social History   Social History  . Marital status: Single    Spouse name: Alice Castillo  . Number of children: Alice Castillo  . Years of education: Alice Castillo   Occupational History  . Not on file.   Social History Main Topics  . Smoking status: Never Smoker  . Smokeless tobacco: Never Used  . Alcohol use 0.6 oz/week    1 Shots of liquor per week     Comment: stopped when found out pregnant  . Drug use: No  . Sexual activity: Not Currently    Birth control/ protection: None   Other Topics Concern  . Not on file   Social History Narrative  . No narrative on file    FAMILY HISTORY: Family History  Problem Relation Age of Onset  . Clotting disorder Father     Factor XI  deficiency (affected or carrier?)  . Heart disease Maternal Uncle   . Diabetes Maternal Uncle   . Stroke Maternal Uncle   . Heart disease Paternal Uncle   . Diabetes Paternal Uncle   . Clotting disorder Paternal Uncle     Factor XI (2) deficiency  . Clotting disorder Paternal Uncle     Factor XI deficiency  . Diabetes Maternal Grandmother   . Heart disease Maternal Grandmother   . Stroke Maternal Grandmother   . Diabetes Maternal Grandfather   . Heart disease Maternal Grandfather   . Stroke Maternal Grandfather     ALLERGIES:  is allergic to food and lactose intolerance (gi).  MEDICATIONS:  Current Outpatient Prescriptions  Medication Sig Dispense Refill  . acetaminophen (TYLENOL) 325 MG tablet Take 2 tablets (650 mg total) by mouth every 4 (four) hours as needed (for pain scale < 4). (Patient not taking: Reported on 11/08/2015) 90 tablet 3  . docusate sodium (COLACE) 100 MG capsule Take 1 capsule (100 mg total) by mouth 2 (two) times daily. (Patient not taking: Reported on 11/08/2015) 20 capsule 0   No current facility-administered medications for this visit.     REVIEW OF SYSTEMS:    10 Point review of Systems was done is negative except as noted above.  PHYSICAL EXAMINATION: ECOG PERFORMANCE STATUS:  0 - Asymptomatic  . Vitals:   11/08/15 1533  BP: 95/69  Pulse: 85  Resp: 17  Temp: 98.3 F (36.8 C)   Filed Weights   11/08/15 1533  Weight: 129 lb (58.5 kg)   .Body mass index is 20.82 kg/m.  GENERAL:alert, in no acute distress and comfortable SKIN: skin color, texture, turgor are normal, no rashes or significant lesions EYES: normal, conjunctiva are pink and non-injected, sclera clear OROPHARYNX:no exudate, no erythema and lips, buccal mucosa, and tongue normal  NECK: supple, no JVD, thyroid normal size, non-tender, without nodularity LYMPH:  no palpable lymphadenopathy in the cervical, axillary or inguinal LUNGS: clear to auscultation with normal respiratory  effort HEART: regular rate & rhythm,  no murmurs and no lower extremity edema ABDOMEN: abdomen soft, non-tender, normoactive bowel sounds . Musculoskeletal: no cyanosis of digits and no clubbing  PSYCH: alert & oriented x 3 with fluent speech NEURO: no focal motor/sensory deficits  LABORATORY DATA:  I have reviewed the data as listed  . CBC Latest Ref Rng & Units 08/09/2015 06/10/2015 06/08/2015  WBC 3.9 - 10.3 10e3/uL 6.0 10.2 11.9(H)  Hemoglobin 11.6 - 15.9 g/dL 12.4 10.1(L) 9.3(L)  Hematocrit 34.8 - 46.6 % 37.1 30.7(L) 28.3(L)  Platelets 145 - 400 10e3/uL 251 217 179   . CBC    Component Value Date/Time   WBC 6.0 08/09/2015 1615   WBC 10.2 06/10/2015 1203   RBC 4.45 08/09/2015 1615   RBC 3.55 (L) 06/10/2015 1203   HGB 12.4 08/09/2015 1615   HCT 37.1 08/09/2015 1615   PLT 251 08/09/2015 1615   MCV 83.4 08/09/2015 1615   MCH 27.9 08/09/2015 1615   MCH 28.5 06/10/2015 1203   MCHC 33.4 08/09/2015 1615   MCHC 32.9 06/10/2015 1203   RDW 12.6 08/09/2015 1615   LYMPHSABS 2.2 08/09/2015 1615   MONOABS 0.5 08/09/2015 1615   EOSABS 0.2 08/09/2015 1615   BASOSABS 0.0 08/09/2015 1615   . CMP Latest Ref Rng & Units 08/09/2015 04/01/2015 01/15/2015  Glucose 70 - 140 mg/dl 83 77 86  BUN 7.0 - 26.0 mg/dL 9.1 4.9(L) <5(L)  Creatinine 0.6 - 1.1 mg/dL 0.7 0.6 0.45  Sodium 136 - 145 mEq/L 142 139 135  Potassium 3.5 - 5.1 mEq/L 3.8 3.5 3.4(L)  Chloride 101 - 111 mmol/L - - 104  CO2 22 - 29 mEq/L 26 22 22   Calcium 8.4 - 10.4 mg/dL 9.6 9.1 9.2  Total Protein 6.4 - 8.3 g/dL 7.7 6.5 6.5  Total Bilirubin 0.20 - 1.20 mg/dL 0.34 0.42 0.3  Alkaline Phos 40 - 150 U/L 114 104 52  AST 5 - 34 U/L 23 14 16   ALT 0 - 55 U/L 41 13 14     RADIOGRAPHIC STUDIES: I have personally reviewed the radiological images as listed and agreed with the findings in the report. No results found.  ASSESSMENT & PLAN:   20 year old Caucasian female with a family history of possible factor XI deficiency in her  father and paternal uncle.  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 Factor XI levels near to pregnancy were 51%. She did not have significant bleeding during her vaginal delivery except with some bleeding due to her perineal injuries requiring sutures. Was on Lysteda for about 8 Castillo after discharge from the hospital. Patient reports she had her first period after her delivery recently and it was somewhat  heavier. The patient notes that she has not had regular periods and therefore we cannot assess if they are heavy.  He reports no other evidence of easy bruisability or bleeding Plan -Patient's hemoglobin has now completely normalized with no evidence of anemia and no evidence of active bleeding at this time. -patient notes that she is not actively breast-feeding at this time. -patient was recommended to set up her primary care physician for her continued medical management.  She notes that she will be seeing Dr. Silver Huguenin who is also primary care physician for her mother. -We discussed that if she has heavy periods she needs to connect with her gynecologist to discuss measures to control her periods including the possible use of oral contraceptives.  Additionally/alternatively could consider the use of lysteda peri-menstrually to control menstrual bleeding if required and if she is not actively breast-feeding. -Avoid NSAIDs other than acetaminophen and increased risk of bleeding. -Patient will need to receive FFP's with significant surgical procedures for appropriate factor XI replacement with new levels drawn and replacement ordered accordingly.  Patient was counseled to call us if recommendations were needed regarding this. -Continue follow-up with primary care physician and gynecologist .   RTC with Dr Irene Limbo on an as needed basis.  Patient has our clinic  on that information if any new questions or concerns arise.  All of the patients questions were answered with apparent satisfaction. The patient knows to call the clinic with any problems, questions or concerns.  I spent 15 minutes counseling the patient face to face. The total time spent in the appointment was 20 minutes and more than 50% was on counseling and direct patient cares.    Sullivan Lone MD [place] AAHIVMS [facility] Hematology/Oncology Physician [facility]  (Office):       [phone] (Work cell):  [phone] (Fax):           [phone]

## 2016-10-30 IMAGING — US US OB LIMITED
1 series · 14 of 28 positions shown · non-contrast
Comparison: none

CLINICAL DATA: Sharp abdominal pain. Approximately 20 weeks
pregnant.

EXAM:
LIMITED OBSTETRIC ULTRASOUND

[Series 1: us ob limited · 0.28mm/px · 29 acquisitions, 14 frames shown]
[im 2/29]
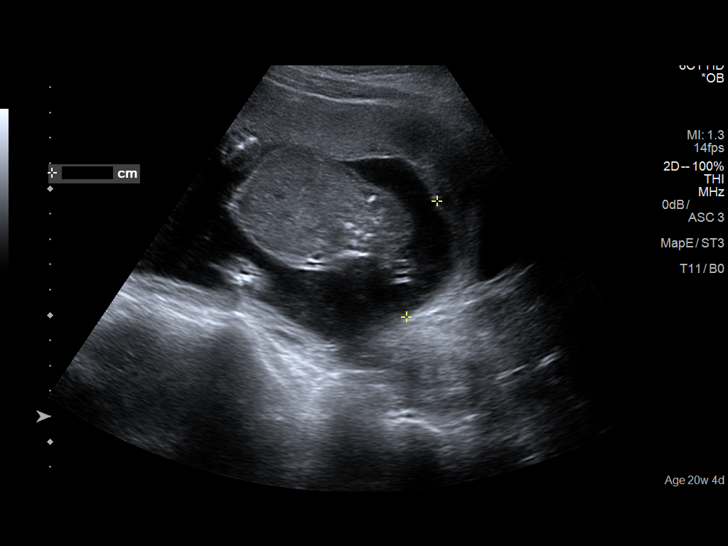
[im 4/29]
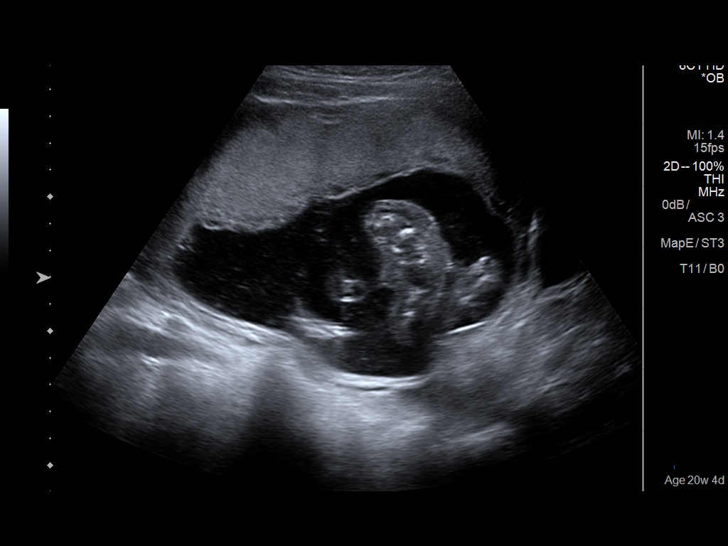
[im 6/29]
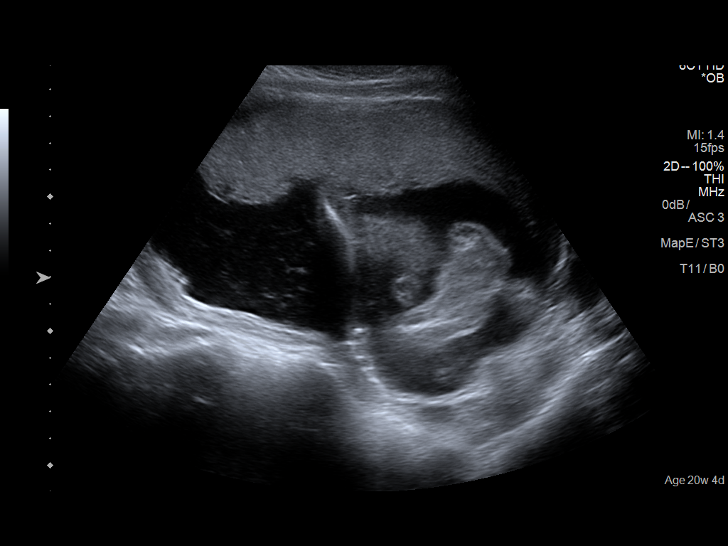
[im 8/29]
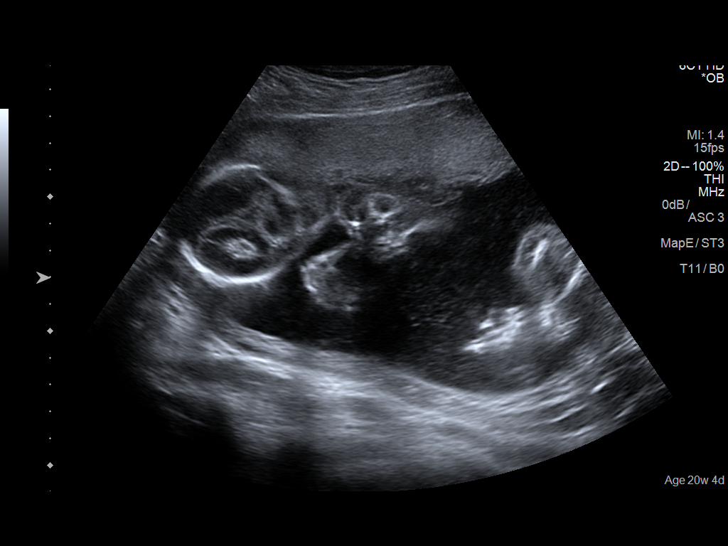
[im 10/29]
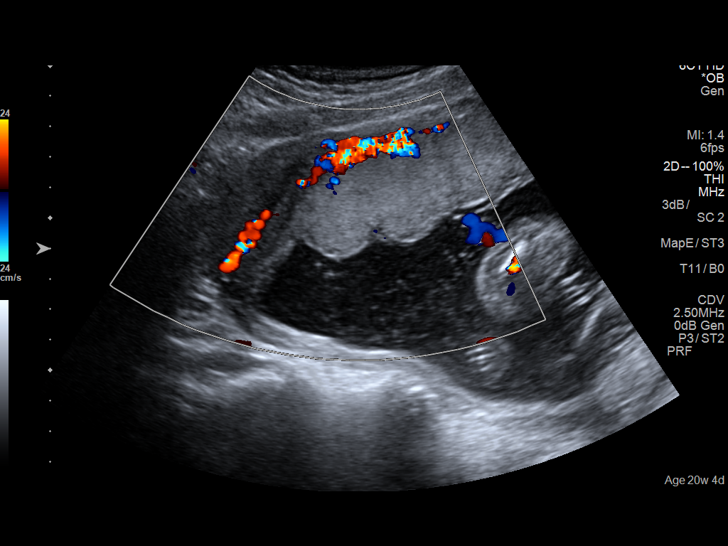
[im 12/29]
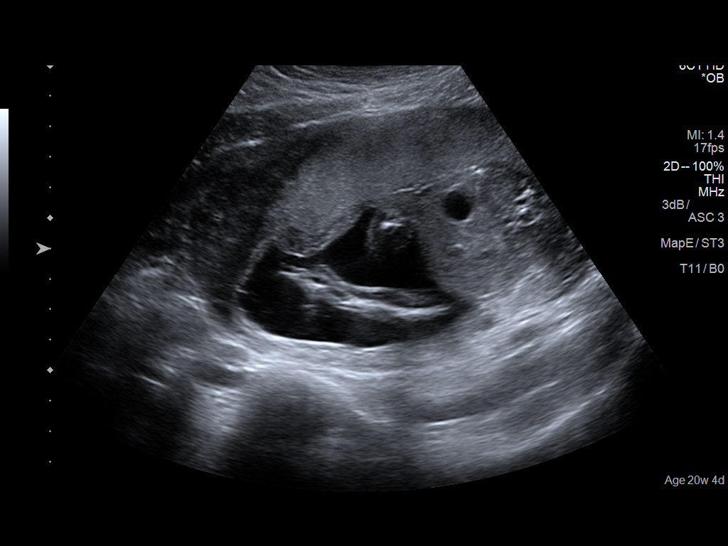
[im 14/29]
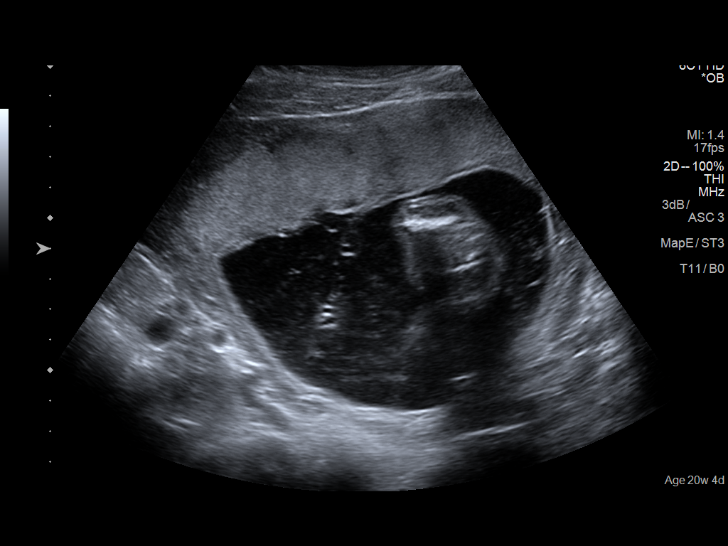
[im 16/29]
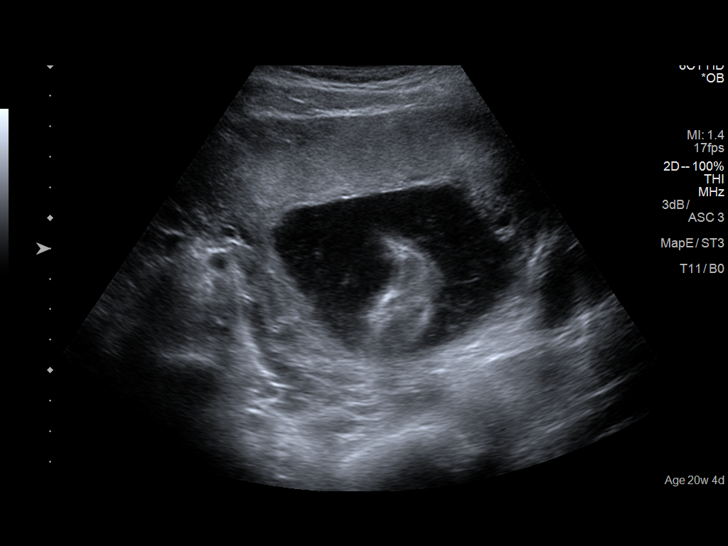
[im 18/29]
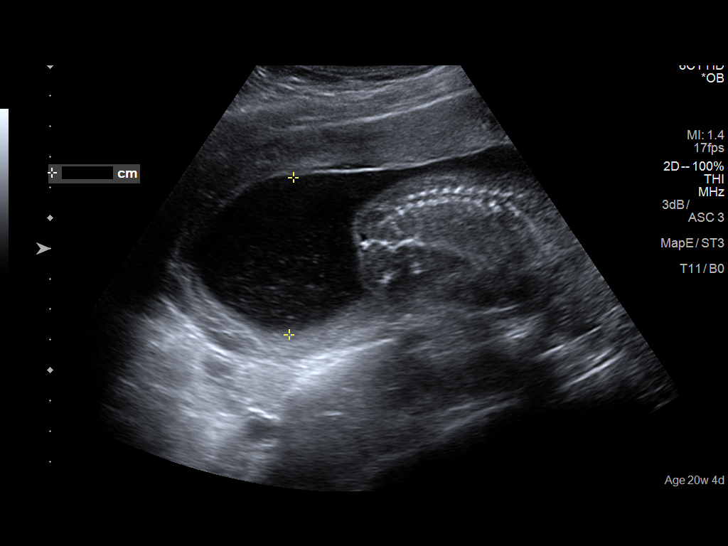
[im 20/29]
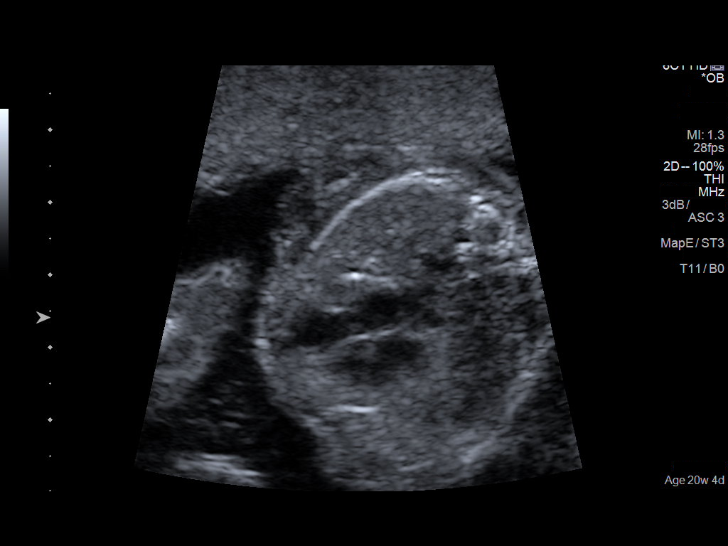
[im 22/29]
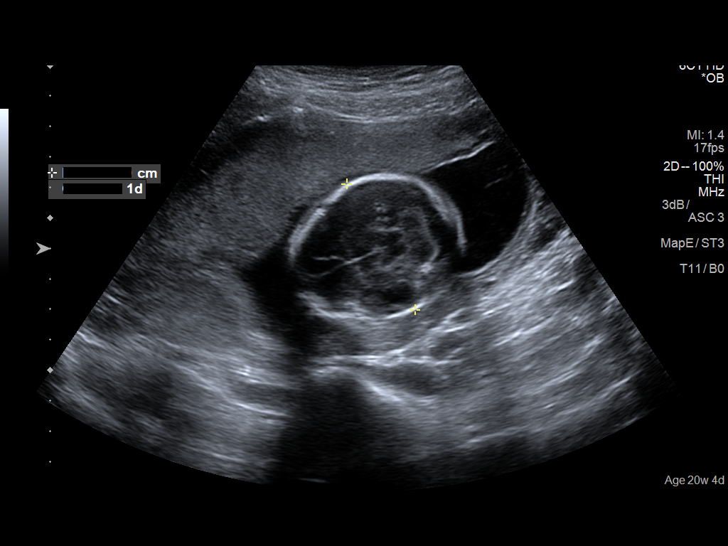
[im 24/29]
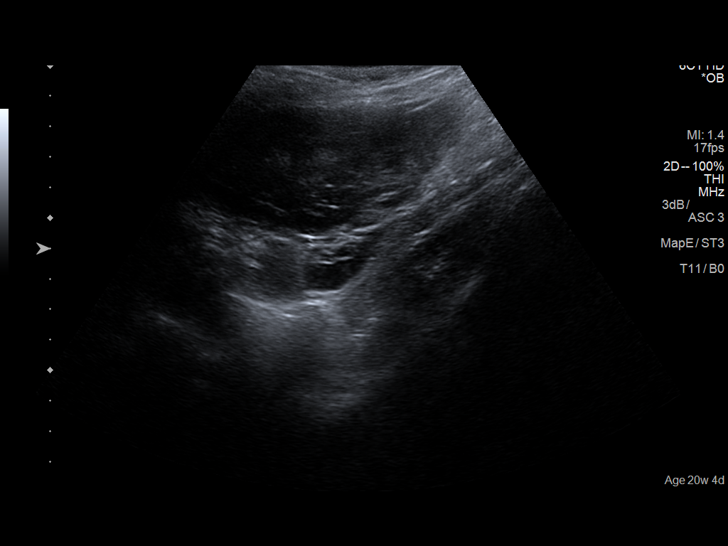
[im 26/29]
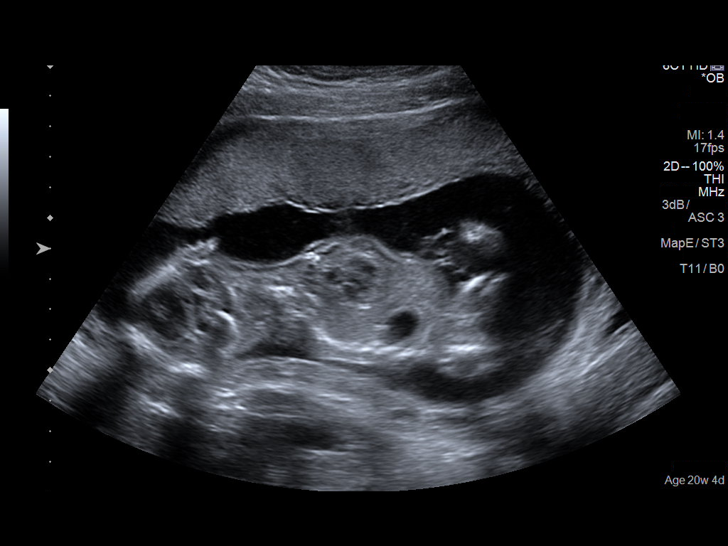
[im 29/29]
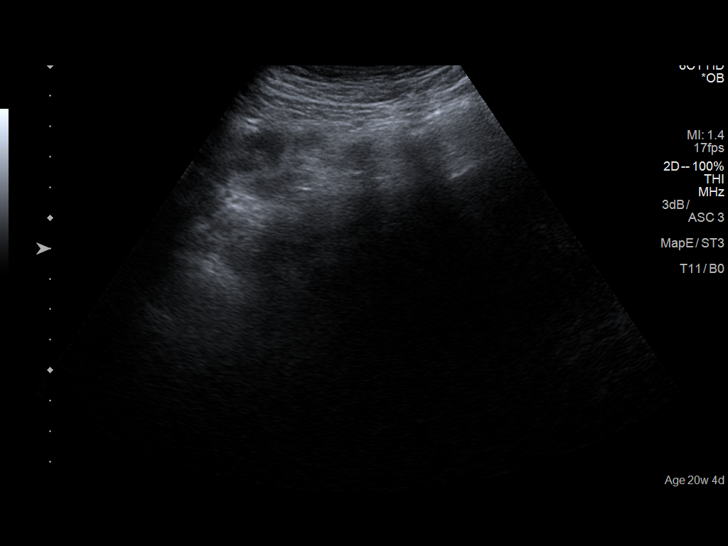

[14 of 28 positions shown; findings below may reference images not displayed]

FINDINGS: Number of Fetuses: 1

Heart Rate:  153 bpm

Movement: Yes

Presentation: Breech

Placental Location: Anterior

Previa: No

Amniotic Fluid (Subjective):  Within normal limits.

BPD:  4.6cm 19w  5d

MATERNAL FINDINGS:

Cervix:  Appears closed.

Uterus/Adnexae: No abnormality visualized. No placental abruption
visualized.
IMPRESSION: Single living intrauterine fetus in breech presentation. No acute
maternal findings visualized.

This exam is performed on an emergent basis and does not
comprehensively evaluate fetal size, dating, or anatomy; follow-up
complete OB US should be considered if further fetal assessment is
warranted.

## 2017-02-15 IMAGING — US US MFM OB COMP +14 WKS
1 series · 14 of 28 positions shown · non-contrast
Comparison: none

[Series 1: us mfm ob comp +14 wks · 14 of 120 slices shown]
[im 5/120]
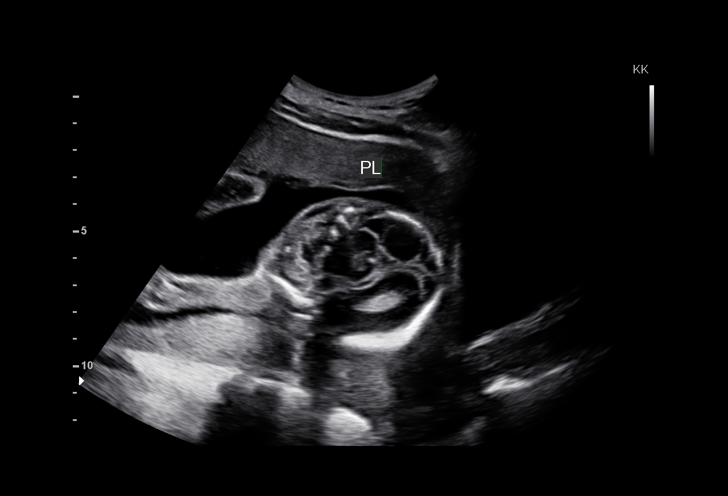
[im 14/120]
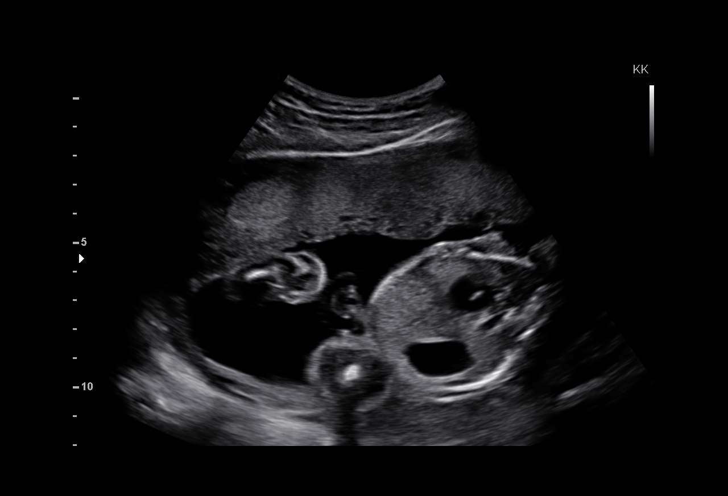
[im 23/120]
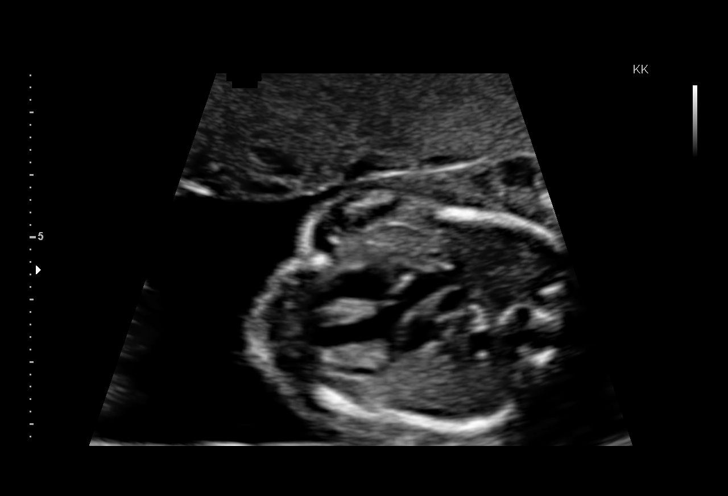
[im 31/120]
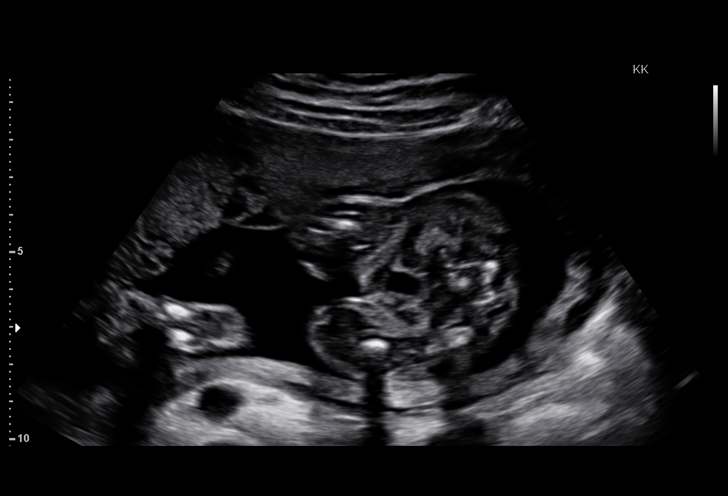
[im 40/120]
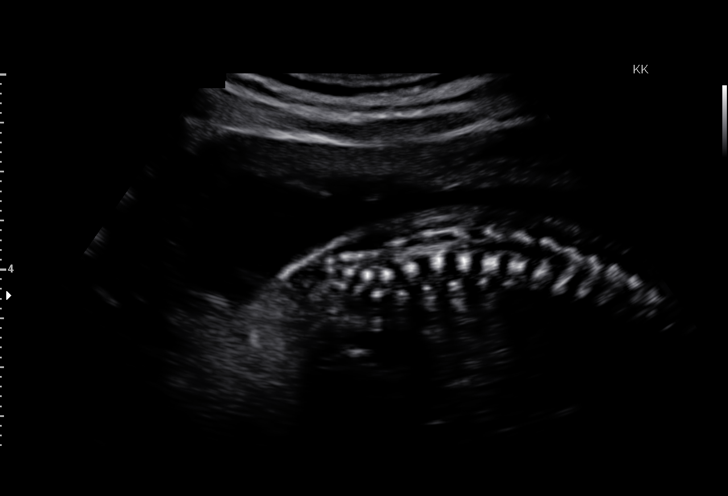
[im 49/120]
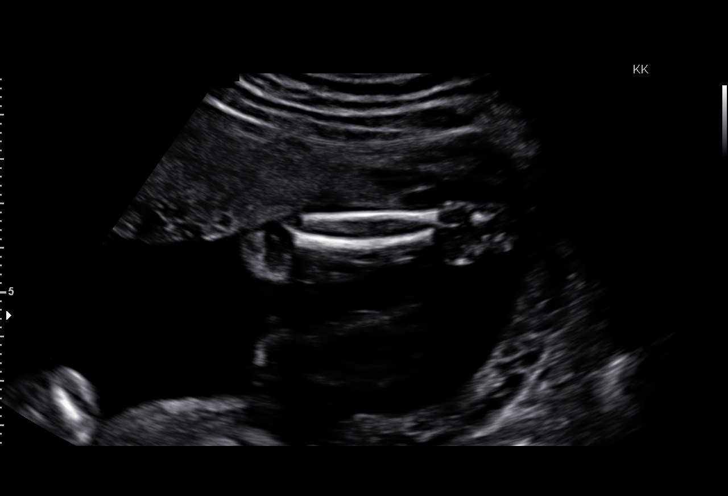
[im 58/120]
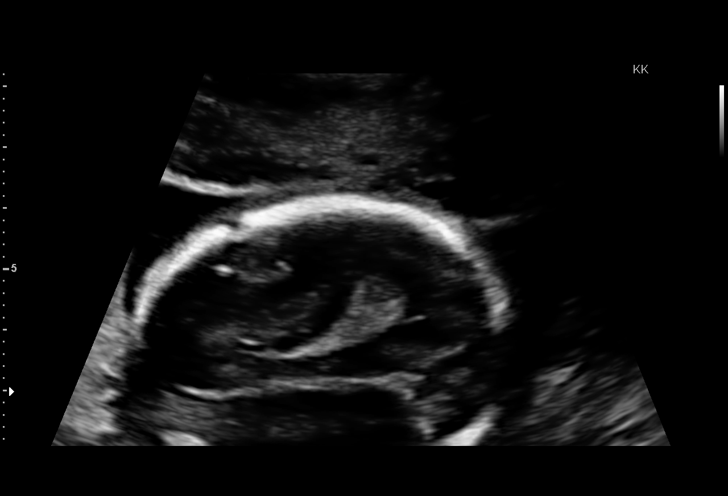
[im 67/120]
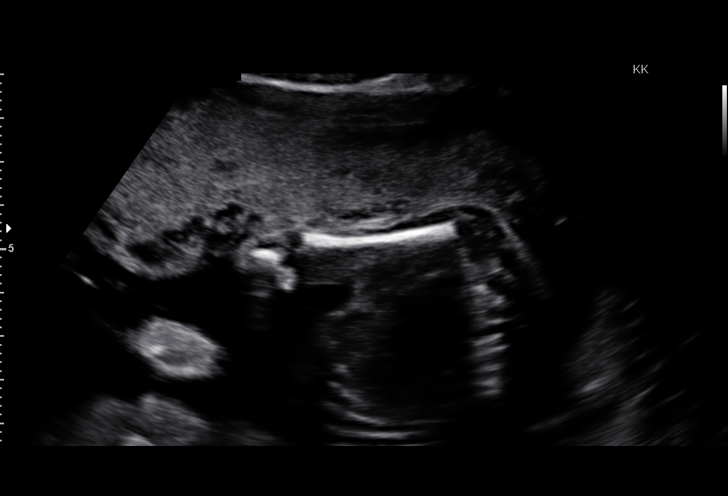
[im 75/120]
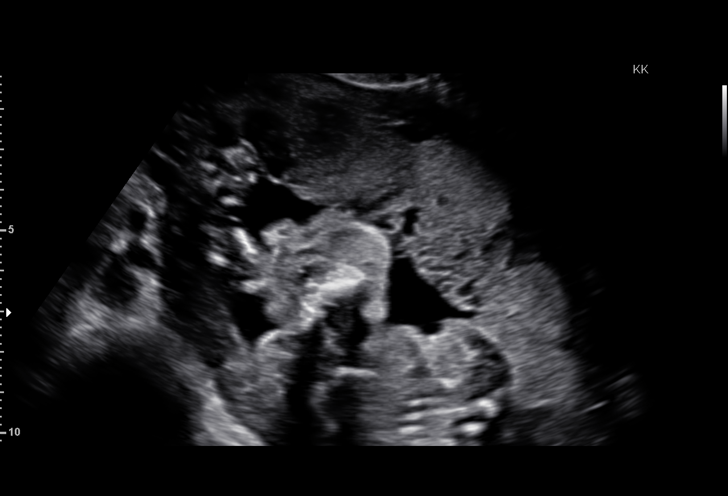
[im 84/120]
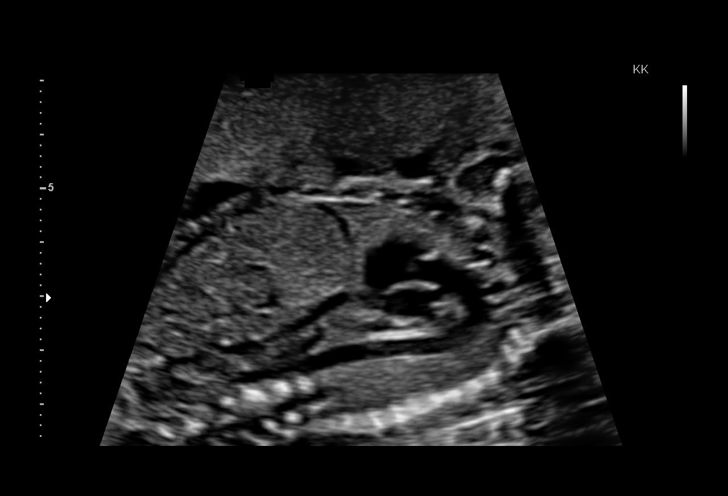
[im 93/120]
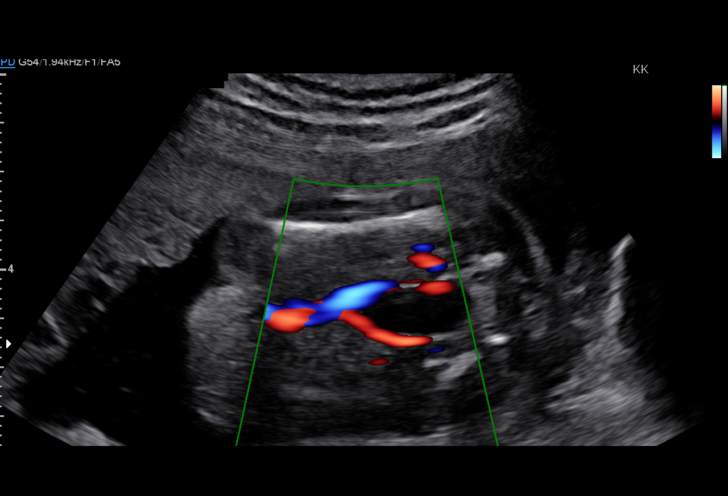
[im 102/120]
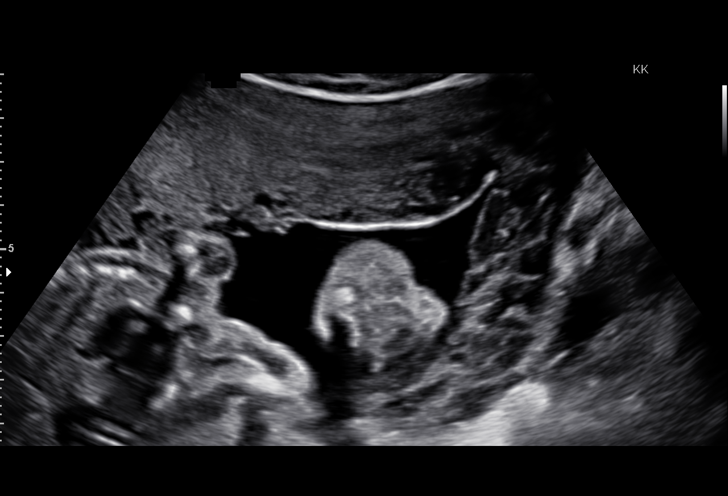
[im 111/120]
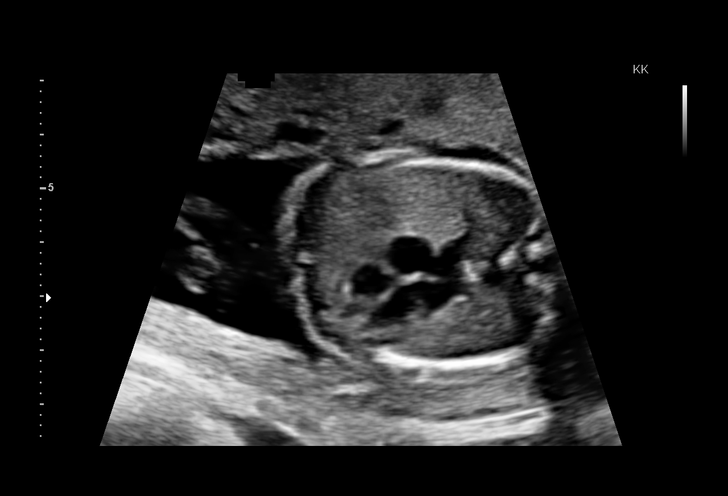
[im 120/120]
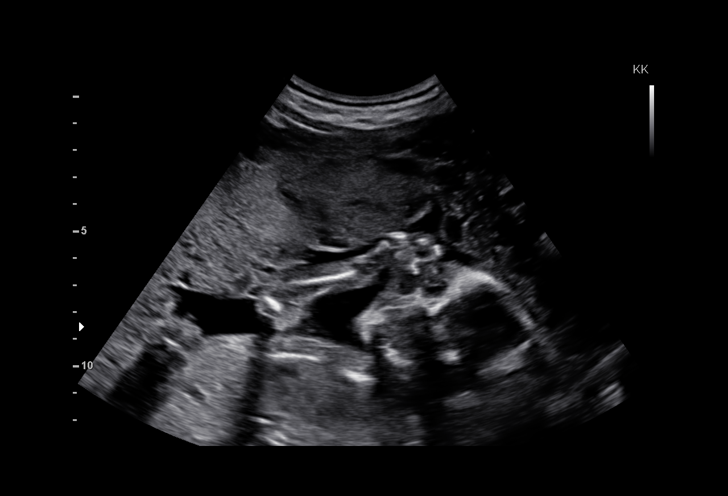

[14 of 28 positions shown; findings below may reference images not displayed]

pm)

Name:       NILAM CRIOLLO                         Visit  01/31/2015 [DATE]
Date:

Indications

Basic anatomic survey                           Z36
Other mental disorder complicating
pregnancy, second trimester
21 weeks gestation of pregnancy
OB History

Height:        5'6"   Weight:   131        BMI:
Gravidity:     1
Fetal Evaluation

Num Of Fetuses:      1
Fetal Heart          159
Rate(bpm):
Cardiac Activity:    Observed
Presentation:        Cephalic
Placenta:            Anterior, above cervical os
P. Cord Insertion:   Visualized

Amniotic Fluid
AFI FV:      Subjectively within normal limits
Larg Pckt:      5.4  cm
Biometry

BPD:        53  mm     G. Age:   22w 1d                  CI:        72.36   %    70 - 86
FL/HC:      18.1   %    15.9 -
HC:      198.2  mm     G. Age:   22w 0d        71   %    HC/AC:      1.12        1.06 -
AC:      177.2  mm     G. Age:   22w 4d        82   %    FL/BPD      67.5   %
:
FL:       35.8  mm     G. Age:   21w 2d        42   %    FL/AC:      20.2   %    20 - 24
HUM:      35.9  mm     G. Age:   22w 4d        76   %

Est.         471   gm    1 lb 1 oz      57   %
FW:
Gestational Age

LMP:           21w 2d        Date:  09/04/14                  EDD:   06/11/15
U/S Today:     22w 0d                                         EDD:   06/06/15
Best:          21w 2d    Det. By:   LMP  (09/04/14)           EDD:   06/11/15
Anatomy

Cranium:          Appears normal         Aortic Arch:       Appears normal
Fetal Cavum:      Appears normal         Ductal Arch:       Appears normal
Ventricles:       Appears normal         Diaphragm:         Appears normal
Choroid Plexus:   Appears normal         Stomach:           Appears normal,
left sided
Cerebellum:       Appears normal         Abdomen:           Appears normal
Posterior         Appears normal         Abdominal          Appears nml (cord
Fossa:                                   Wall:              insert, abd wall)
Nuchal Fold:      Not applicable (>20    Cord Vessels:      Appears normal (3
wks GA)                                   vessel cord)
Face:             Appears normal         Kidneys:           Appear normal
(orbits and profile)
Lips:             Appears normal         Bladder:           Appears normal
Heart:            Appears normal         Spine:             Appears normal
(4CH, axis, and
situs)
RVOT:             Appears normal         Upper              Appears normal
Extremities:
LVOT:             Appears normal         Lower              Appears normal
Extremities:

Other:   Male gender. Heels and 5th digit visualized. Technically difficult due
to fetal position.
Cervix Uterus Adnexa

Cervix
Length:             3.5  cm.
Normal appearance by transabdominal scan.

Adnexa:       No abnormality visualized.
Impression

SIUP at 21+2 weeks
Normal detailed fetal anatomy
Normal amniotic fluid volume
Measurements consistent with LMP dating
Recommendations

Follow-up as clinically indicated

## 2017-12-19 ENCOUNTER — Encounter: Payer: Self-pay | Admitting: *Deleted

## 2019-02-27 NOTE — L&D Delivery Note (Signed)
OB/GYN Faculty Practice Delivery Note  Alice Castillo is a 24 y.o. [id number] s/p vag del at [redacted]w[redacted]d She was admitted for postdates IOL.   ROM: 3h 099mith clear fluid GBS Status: neg Maximum Maternal Temperature: 100.1  Labor Progress: . Marland Kitchens PlCancelliereas admitted in the morning for a postdates IOL. She had a cervical foley placed, SROM shortly after it came out, and then spont progression to vag del. She had a +Covid preadmission test, and was unable to use nitrous, and due to her Factor XI deficiency was unable to have an epidural placed. Therefore she used IV Fentanyl for mod pain relief.  Delivery Date/Time: May 14th, 2021 at 1549 Delivery: Called to room and patient was complete and pushing. Head delivered LOA. No nuchal cord present. Shoulder and body delivered in usual fashion. Infant with spontaneous cry, placed on mother's abdomen, dried and stimulated. Cord clamped x 2 after 1-minute delay, and cut by FOB. Cord blood drawn. Placenta delivered spontaneously with gentle cord traction. Fundus firm with massage and Pitocin. Labia, perineum, vagina, and cervix inspected and found to have a 2nd deg perineal lac, along with an approx 2x2cm inclusion cyst protruding from the open lac, drained for white purulent fluid. A second inclusion cyst a little larger than a pea was noted on the right labia- drained per pt request for yellow purulent fluid. She will have an evaluation for need of Rhogam, and will also have the MMR vax & DMPA ordered (per her request).  Placenta: spont, intact Complications: none Lacerations: 2nd deg perineal lac EBL: 100cc Analgesia: 1% lidocaine (Fentanyl IV from labor)  Postpartum Planning [x]  message to sent to schedule follow-up   Infant: female  APGARs 8/9  4010gm (8lb 13.4oz)  KiMyrtis SerCNM  07/10/2019 4:40 PM

## 2019-04-06 ENCOUNTER — Inpatient Hospital Stay (HOSPITAL_BASED_OUTPATIENT_CLINIC_OR_DEPARTMENT_OTHER): Payer: BC Managed Care – PPO

## 2019-04-06 ENCOUNTER — Encounter (HOSPITAL_COMMUNITY): Payer: Self-pay | Admitting: Obstetrics and Gynecology

## 2019-04-06 ENCOUNTER — Inpatient Hospital Stay (HOSPITAL_COMMUNITY)
Admission: AD | Admit: 2019-04-06 | Discharge: 2019-04-06 | Disposition: A | Discharge status: Home / Self Care | Payer: BC Managed Care – PPO | Attending: Obstetrics & Gynecology | Admitting: Obstetrics & Gynecology

## 2019-04-06 ENCOUNTER — Other Ambulatory Visit: Payer: Self-pay

## 2019-04-06 DIAGNOSIS — R102 Pelvic and perineal pain: Secondary | ICD-10-CM

## 2019-04-06 DIAGNOSIS — Z3A27 27 weeks gestation of pregnancy: Secondary | ICD-10-CM | POA: Diagnosis not present

## 2019-04-06 DIAGNOSIS — O0932 Supervision of pregnancy with insufficient antenatal care, second trimester: Secondary | ICD-10-CM | POA: Insufficient documentation

## 2019-04-06 DIAGNOSIS — Z6791 Unspecified blood type, Rh negative: Secondary | ICD-10-CM | POA: Diagnosis not present

## 2019-04-06 DIAGNOSIS — R109 Unspecified abdominal pain: Secondary | ICD-10-CM | POA: Diagnosis present

## 2019-04-06 DIAGNOSIS — O26892 Other specified pregnancy related conditions, second trimester: Secondary | ICD-10-CM | POA: Diagnosis not present

## 2019-04-06 DIAGNOSIS — O26899 Other specified pregnancy related conditions, unspecified trimester: Secondary | ICD-10-CM

## 2019-04-06 LAB — URINALYSIS, ROUTINE W REFLEX MICROSCOPIC
Bilirubin Urine: NEGATIVE
Glucose, UA: NEGATIVE mg/dL
Hgb urine dipstick: NEGATIVE
Ketones, ur: NEGATIVE mg/dL
Nitrite: NEGATIVE
Protein, ur: NEGATIVE mg/dL
Specific Gravity, Urine: 1.014 (ref 1.005–1.030)
pH: 7 (ref 5.0–8.0)

## 2019-04-06 LAB — CBC
HCT: 32.3 % — ABNORMAL LOW (ref 36.0–46.0)
Hemoglobin: 10.7 g/dL — ABNORMAL LOW (ref 12.0–15.0)
MCH: 30.8 pg (ref 26.0–34.0)
MCHC: 33.1 g/dL (ref 30.0–36.0)
MCV: 93.1 fL (ref 80.0–100.0)
Platelets: 181 10*3/uL (ref 150–400)
RBC: 3.47 MIL/uL — ABNORMAL LOW (ref 3.87–5.11)
RDW: 12.9 % (ref 11.5–15.5)
WBC: 7.1 10*3/uL (ref 4.0–10.5)
nRBC: 0 % (ref 0.0–0.2)

## 2019-04-06 LAB — FETAL FIBRONECTIN: Fetal Fibronectin: NEGATIVE

## 2019-04-06 MED ORDER — COMFORT FIT MATERNITY SUPP SM MISC
1.0000 [IU] | Freq: Every day | 0 refills | Status: DC | PRN
Start: 2019-04-06 — End: 2019-04-13

## 2019-04-06 MED ORDER — LACTATED RINGERS IV BOLUS: 1000.0000 mL | Freq: Once | INTRAVENOUS | Status: DC

## 2019-04-06 MED ORDER — RHO D IMMUNE GLOBULIN 1500 UNIT/2ML IJ SOSY
300.0000 ug | PREFILLED_SYRINGE | Freq: Once | INTRAMUSCULAR | Status: AC
  Administered 2019-04-06: 20:00:00 300 ug via INTRAMUSCULAR
  Filled 2019-04-06: qty 2

## 2019-04-06 NOTE — MAU Note (Signed)
Been going on a few wks.  Having cramps in lower abd, they feel deep and seem to be getting worse. No prenatal care. Was seen once early in preg at [facility].

## 2019-04-06 NOTE — MAU Provider Note (Signed)
History     CSN: [id number]  Arrival date and time: 04/06/19 1520   First Provider Initiated Contact with Patient 04/06/19 1702      Chief Complaint  Patient presents with  . Abdominal Pain   [name] is a 24 y.o. G2P1001 at [redacted]w[redacted]d who presents to MAU for PTL evaluation after "deep pelvic cramping" began a few weeks ago, but it kept getting worse. Pt reports she did not have transportation to the hospital yesterday, so she could not come until today. Pt denies anything in the vagina in the past 24hrs.  Pt denies ctx, change in vaginal discharge amount/color/consistency, VB, new onset backache, intermittent abdominal discomfort/pain, pelvic pressure/pain. Pt denies chest pain and SOB.  Pt denies constipation, diarrhea, or urinary problems. Pt denies fever, chills, fatigue, sweating or changes in appetite. Pt denies dizziness, light-headedness, weakness.  Pt denies VB, LOF and reports good FM.  Current pregnancy problems? pt has not yet been seen Blood Type? O NEGATIVE Allergies? Bananas, lactose Current medications? PNVs Current PNC & next appt? Pt does not have an OB   OB History    Gravida  2   Para  1   Term  1   Preterm      AB      Living  1     SAB      TAB      Ectopic      Multiple  0   Live Births  1           Past Medical History:  Diagnosis Date  . Anxiety   . Bipolar 1 disorder ([place])   . Chlamydia infection   . Depression   . 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 Headache     Past Surgical History:  Procedure Laterality Date  . NO PAST SURGERIES      Family History  Problem Relation Age of Onset  . Clotting disorder Father        Factor XI deficiency (affected or carrier?)  . Heart disease Maternal Uncle   . Diabetes Maternal Uncle   . Stroke Maternal Uncle   . Heart disease Paternal Uncle   . Diabetes Paternal Uncle   . Clotting disorder Paternal Uncle        Factor XI (8) deficiency  . Clotting disorder Paternal  Uncle        Factor XI deficiency  . Diabetes Maternal Grandmother   . Heart disease Maternal Grandmother   . Stroke Maternal Grandmother   . Diabetes Maternal Grandfather   . Heart disease Maternal Grandfather   . Stroke Maternal Grandfather     Social History   Tobacco Use  . Smoking status: Never Smoker  . Smokeless tobacco: Never Used  Substance Use Topics  . Alcohol use: Yes    Alcohol/week: 1.0 standard drinks    Types: 1 Shots of liquor per week    Comment: stopped when found out pregnant  . Drug use: No    Allergies:  Allergies  Allergen Reactions  . Food     Bananas-throat swells/shortness of breath  . Lactose Intolerance (Gi) Other (See Comments)    Stomach irritation, and diarhea    Medications Prior to Admission  Medication Sig Dispense Refill Last Dose  . Prenatal Vit-Fe Fumarate-FA (PRENATAL MULTIVITAMIN) TABS tablet Take 1 tablet by mouth daily at 12 noon.     Marland Kitchen acetaminophen (TYLENOL) 325 MG tablet Take 2 tablets (650 mg total) by mouth every 4 (  four) hours as needed (for pain scale < 4). (Patient not taking: Reported on 11/08/2015) 90 tablet 3   . docusate sodium (COLACE) 100 MG capsule Take 1 capsule (100 mg total) by mouth 2 (two) times daily. (Patient not taking: Reported on 11/08/2015) 20 capsule 0     Review of Systems  Constitutional: Negative for chills, diaphoresis, fatigue and fever.  Eyes: Negative for visual disturbance.  Respiratory: Negative for shortness of breath.   Cardiovascular: Negative for chest pain.  Gastrointestinal: Negative for abdominal pain, constipation, diarrhea, nausea and vomiting.  Genitourinary: Positive for pelvic pain. Negative for dysuria, flank pain, frequency, urgency, vaginal bleeding and vaginal discharge.  Neurological: Negative for dizziness, weakness, light-headedness and headaches.   Physical Exam   Blood pressure (!) 90/42, pulse 74, temperature 98.9 F (37.2 C), temperature source Oral, resp. rate 16,  height 5\' 6"  (1.676 m), weight 67.4 kg, SpO2 100 %, currently breastfeeding.  Patient Vitals for the past 24 hrs:  BP Temp Temp src Pulse Resp SpO2 Height Weight  04/06/19 1720 -- -- -- -- -- 100 % -- --  04/06/19 1715 -- -- -- -- -- 100 % -- --  04/06/19 1710 -- -- -- -- -- 100 % -- --  04/06/19 1705 -- -- -- -- -- 100 % -- --  04/06/19 1700 -- -- -- -- -- 100 % -- --  04/06/19 1656 (!) 90/42 -- -- 74 -- -- -- --  04/06/19 1655 -- -- -- -- -- 100 % -- --  04/06/19 1650 -- -- -- -- -- 100 % -- --  04/06/19 1645 -- -- -- -- -- 100 % -- --  04/06/19 1640 -- -- -- -- -- 100 % -- --  04/06/19 1635 -- -- -- -- -- 100 % -- --  04/06/19 1630 -- -- -- -- -- 100 % -- --  04/06/19 1625 -- -- -- -- -- 100 % -- --  04/06/19 1605 112/68 98.9 F (37.2 C) Oral 83 16 100 % 5\' 6"  (1.676 m) 67.4 kg   Physical Exam  Constitutional: She is oriented to person, place, and time. She appears well-developed and well-nourished. No distress.  HENT:  Head: Normocephalic and atraumatic.  Respiratory: Effort normal.  GI: Soft. She exhibits no distension and no mass. There is no abdominal tenderness. There is no rebound and no guarding.  Genitourinary: There is no rash, tenderness or lesion on the right labia. There is no rash, tenderness or lesion on the left labia.    Genitourinary Comments: Dilation: Fingertip Effacement (%): Thick Cervical Position: Posterior Exam by:: Vernice Jefferson, NP   Neurological: She is alert and oriented to person, place, and time.  Skin: Skin is warm and dry. She is not diaphoretic.  Psychiatric: She has a normal mood and affect. Her behavior is normal. Judgment and thought content normal.   Results for orders placed or performed during the hospital encounter of 04/06/19 (from the past 24 hour(s))  Urinalysis, Routine w reflex microscopic     Status: Abnormal   Collection Time: 04/06/19  4:20 PM  Result Value Ref Range   Color, Urine YELLOW YELLOW   APPearance CLOUDY (A) CLEAR    Specific Gravity, Urine 1.014 1.005 - 1.030   pH 7.0 5.0 - 8.0   Glucose, UA NEGATIVE NEGATIVE mg/dL   Hgb urine dipstick NEGATIVE NEGATIVE   Bilirubin Urine NEGATIVE NEGATIVE   Ketones, ur NEGATIVE NEGATIVE mg/dL   Protein, ur NEGATIVE NEGATIVE mg/dL   Nitrite  NEGATIVE NEGATIVE   Leukocytes,Ua SMALL (A) NEGATIVE   RBC / HPF 0-5 0 - 5 RBC/hpf   WBC, UA 0-5 0 - 5 WBC/hpf   Bacteria, UA RARE (A) NONE SEEN   Squamous Epithelial / LPF 6-10 0 - 5   Mucus PRESENT    Hyaline Casts, UA PRESENT    Amorphous Crystal PRESENT   CBC     Status: Abnormal   Collection Time: 04/06/19  5:19 PM  Result Value Ref Range   WBC 7.1 4.0 - 10.5 K/uL   RBC 3.47 (L) 3.87 - 5.11 MIL/uL   Hemoglobin 10.7 (L) 12.0 - 15.0 g/dL   HCT 32.3 (L) 36.0 - 46.0 %   MCV 93.1 80.0 - 100.0 fL   MCH 30.8 26.0 - 34.0 pg   MCHC 33.1 30.0 - 36.0 g/dL   RDW 12.9 11.5 - 15.5 %   Platelets 181 150 - 400 K/uL   nRBC 0.0 0.0 - 0.2 %  Rh IG workup (includes ABO/Rh)     Status: None (Preliminary result)   Collection Time: 04/06/19  5:19 PM  Result Value Ref Range   Gestational Age(Wks) 27.2    ABO/RH(D)      O NEG Performed at [facility], [address], [place], [place] [address]    Antibody Screen PENDING    Fetal Screen PENDING   Fetal fibronectin     Status: None   Collection Time: 04/06/19  5:41 PM  Result Value Ref Range   Fetal Fibronectin NEGATIVE NEGATIVE   MAU Course  Procedures  MDM -pelvic cramping, worsening over the past couple days -UA: cloudy/sm leuks/rare bacteria, sending urine for culture based on symptoms -CBC: H/H 10.7/32.3 -Rhogam work-up done and RhoGAM given, pt is almost 28 weeks without a prenatal care provider -fFN: negative -[place]: posterior placenta, normal fluid, normal cervix -EFM: reactive       -baseline: 150       -variability: moderate       -accels: present, 10x10       -decels: few variables       -TOCO: no ctx -CE: FT/long/posterior -after lying in bed for  approximately 72min in MAU, pt reports her pain is no longer present. Pt clarifies at this time that the pain is only present when walking/moving, and is not present when sitting or lying still. No analgesic medication administered, will give RX for pregnancy support belt. -pt discharged to home in stable condition  Orders Placed This Encounter  Procedures  . Culture, OB Urine    Standing Status:   Standing    Number of Occurrences:   1  . [place] MFM OB LIMITED    Please do dating.    Standing Status:   Standing    Number of Occurrences:   1    Order Specific Question:   Symptom/Reason for Exam    Answer:   Pelvic cramping in antepartum period [id number]  . Urinalysis, Routine w reflex microscopic    Standing Status:   Standing    Number of Occurrences:   1  . Fetal fibronectin    Standing Status:   Standing    Number of Occurrences:   1  . CBC    Standing Status:   Standing    Number of Occurrences:   1  . Rh IG workup (includes ABO/Rh)    Standing Status:   Standing    Number of Occurrences:   1    Order Specific Question:  Weeks of Gestation    Answer:   27.2  . Discharge patient    Order Specific Question:   Discharge disposition    Answer:   01-Home or Self Care [1]    Order Specific Question:   Discharge patient date    Answer:   04/06/2019   Assessment and Plan   1. Pelvic cramping in antepartum period   2. [redacted] weeks gestation of pregnancy   3. Rh negative state in antepartum period   4. No prenatal care in current pregnancy in second trimester    Allergies as of 04/06/2019      Reactions   Food    Bananas-throat swells/shortness of breath   Lactose Intolerance (gi) Other (See Comments)   Stomach irritation, and diarhea      Medication List    TAKE these medications   acetaminophen 325 MG tablet Commonly known as: Tylenol Take 2 tablets (650 mg total) by mouth every 4 (four) hours as needed (for pain scale < 4).   Comfort Fit Maternity Supp Sm Misc 1 Units by  Does not apply route daily as needed.   docusate sodium 100 MG capsule Commonly known as: COLACE Take 1 capsule (100 mg total) by mouth 2 (two) times daily.   prenatal multivitamin Tabs tablet Take 1 tablet by mouth daily at 12 noon.      -will call with culture results, if positive -RX pregnancy support belt with instructions -message sent to [facility] with high importance to schedule for [facility] visit ASAP. Pt reports she has been a patient there in the past and would like to return for care -pt to call [facility] on Wednesday afternoon to schedule appt if she does not hear from them sooner -return MAU precautions given -pt discharged to home in stable condition  Elmyra Ricks E Meryn Sarracino 04/06/2019, 7:35 PM

## 2019-04-06 NOTE — Discharge Instructions (Signed)
Preterm Labor and Birth Information ° °The normal length of a pregnancy is 39-41 weeks. Preterm labor is when labor starts before 37 completed weeks of pregnancy. °What are the risk factors for preterm labor? °Preterm labor is more likely to occur in women who: °· Have certain infections during pregnancy such as a bladder infection, sexually transmitted infection, or infection inside the uterus (chorioamnionitis). °· Have a shorter-than-normal cervix. °· Have gone into preterm labor before. °· Have had surgery on their cervix. °· Are younger than age 17 or older than age 35. °· Are African American. °· Are pregnant with twins or multiple babies (multiple gestation). °· Take street drugs or smoke while pregnant. °· Do not gain enough weight while pregnant. °· Became pregnant shortly after having been pregnant. °What are the symptoms of preterm labor? °Symptoms of preterm labor include: °· Cramps similar to those that can happen during a menstrual period. The cramps may happen with diarrhea. °· Pain in the abdomen or lower back. °· Regular uterine contractions that may feel like tightening of the abdomen. °· A feeling of increased pressure in the pelvis. °· Increased watery or bloody mucus discharge from the vagina. °· Water breaking (ruptured amniotic sac). °Why is it important to recognize signs of preterm labor? °It is important to recognize signs of preterm labor because babies who are born prematurely may not be fully developed. This can put them at an increased risk for: °· Long-term (chronic) heart and lung problems. °· Difficulty immediately after birth with regulating body systems, including blood sugar, body temperature, heart rate, and breathing rate. °· Bleeding in the brain. °· Cerebral palsy. °· Learning difficulties. °· Death. °These risks are highest for babies who are born before 34 weeks of pregnancy. °How is preterm labor treated? °Treatment depends on the length of your pregnancy, your condition,  and the health of your baby. It may involve: °· Having a stitch (suture) placed in your cervix to prevent your cervix from opening too early (cerclage). °· Taking or being given medicines, such as: °? Hormone medicines. These may be given early in pregnancy to help support the pregnancy. °? Medicine to stop contractions. °? Medicines to help mature the baby’s lungs. These may be prescribed if the risk of delivery is high. °? Medicines to prevent your baby from developing cerebral palsy. °If the labor happens before 34 weeks of pregnancy, you may need to stay in the hospital. °What should I do if I think I am in preterm labor? °If you think that you are going into preterm labor, call your health care provider right away. °How can I prevent preterm labor in future pregnancies? °To increase your chance of having a full-term pregnancy: °· Do not use any tobacco products, such as cigarettes, chewing tobacco, and e-cigarettes. If you need help quitting, ask your health care provider. °· Do not use street drugs or medicines that have not been prescribed to you during your pregnancy. °· Talk with your health care provider before taking any herbal supplements, even if you have been taking them regularly. °· Make sure you gain a healthy amount of weight during your pregnancy. °· Watch for infection. If you think that you might have an infection, get it checked right away. °· Make sure to tell your health care provider if you have gone into preterm labor before. °This information is not intended to replace advice given to you by your health care provider. Make sure you discuss any questions you have with your   health care provider. Document Revised: 06/06/2018 Document Reviewed: 07/06/2015 Elsevier Patient Education  [place] Medications in Pregnancy    Acne: Benzoyl Peroxide Salicylic Acid  Backache/Headache: Tylenol: 2 regular strength every 4 hours OR              2 Extra strength  every 6 hours  Colds/Coughs/Allergies: Benadryl (alcohol free) 25 mg every 6 hours as needed Breath right strips Claritin Cepacol throat lozenges Chloraseptic throat spray Cold-Eeze- up to three times per day Cough drops, alcohol free Flonase (by prescription only) Guaifenesin Mucinex Robitussin DM (plain only, alcohol free) Saline nasal spray/drops Sudafed (pseudoephedrine) & Actifed ** use only after [redacted] weeks gestation and if you do not have high blood pressure Tylenol Vicks Vaporub Zinc lozenges Zyrtec   Constipation: Colace Ducolax suppositories Fleet enema Glycerin suppositories Metamucil Milk of magnesia Miralax Senokot Smooth move tea  Diarrhea: Kaopectate Imodium A-D  *NO pepto Bismol  Hemorrhoids: Anusol Anusol HC Preparation H Tucks  Indigestion: Tums Maalox Mylanta Zantac  Pepcid  Insomnia: Benadryl (alcohol free) 25mg  every 6 hours as needed Tylenol PM Unisom, no Gelcaps  Leg Cramps: Tums MagGel  Nausea/Vomiting:  Bonine Dramamine Emetrol Ginger extract Sea bands Meclizine  Nausea medication to take during pregnancy:  Unisom (doxylamine succinate 25 mg tablets) Take one tablet daily at bedtime. If symptoms are not adequately controlled, the dose can be increased to a maximum recommended dose of two tablets daily (1/2 tablet in the morning, 1/2 tablet mid-afternoon and one at bedtime). Vitamin B6 100mg  tablets. Take one tablet twice a day (up to 200 mg per day).  Skin Rashes: Aveeno products Benadryl cream or 25mg  every 6 hours as needed Calamine Lotion 1% cortisone cream  Yeast infection: Gyne-lotrimin 7 Monistat 7   **If taking multiple medications, please check labels to avoid duplicating the same active ingredients **take medication as directed on the label ** Do not exceed 4000 mg of tylenol in 24 hours **Do not take medications that contain aspirin or ibuprofen     Second Trimester of Pregnancy The second  trimester is from week 14 through week 27 (months 4 through 6). The second trimester is often a time when you feel your best. Your body has adjusted to being pregnant, and you begin to feel better physically. Usually, morning sickness has lessened or quit completely, you may have more energy, and you may have an increase in appetite. The second trimester is also a time when the fetus is growing rapidly. At the end of the sixth month, the fetus is about 9 inches long and weighs about 1 pounds. You will likely begin to feel the baby move (quickening) between 16 and 20 weeks of pregnancy. Body changes during your second trimester Your body continues to go through many changes during your second trimester. The changes vary from woman to woman.  Your weight will continue to increase. You will notice your lower abdomen bulging out.  You may begin to get stretch marks on your hips, abdomen, and breasts.  You may develop headaches that can be relieved by medicines. The medicines should be approved by your health care provider.  You may urinate more often because the fetus is pressing on your bladder.  You may develop or continue to have heartburn as a result of your pregnancy.  You may develop constipation because certain hormones are causing the muscles that push waste  through your intestines to slow down.  You may develop hemorrhoids or swollen, bulging veins (varicose veins).  You may have back pain. This is caused by: ? Weight gain. ? Pregnancy hormones that are relaxing the joints in your pelvis. ? A shift in weight and the muscles that support your balance.  Your breasts will continue to grow and they will continue to become tender.  Your gums may bleed and may be sensitive to brushing and flossing.  Dark spots or blotches (chloasma, mask of pregnancy) may develop on your face. This will likely fade after the baby is born.  A dark line from your belly button to the pubic area (linea  nigra) may appear. This will likely fade after the baby is born.  You may have changes in your hair. These can include thickening of your hair, rapid growth, and changes in texture. Some women also have hair loss during or after pregnancy, or hair that feels dry or thin. Your hair will most likely return to normal after your baby is born. What to expect at prenatal visits During a routine prenatal visit:  You will be weighed to make sure you and the fetus are growing normally.  Your blood pressure will be taken.  Your abdomen will be measured to track your baby's growth.  The fetal heartbeat will be listened to.  Any test results from the previous visit will be discussed. Your health care provider may ask you:  How you are feeling.  If you are feeling the baby move.  If you have had any abnormal symptoms, such as leaking fluid, bleeding, severe headaches, or abdominal cramping.  If you are using any tobacco products, including cigarettes, chewing tobacco, and electronic cigarettes.  If you have any questions. Other tests that may be performed during your second trimester include:  Blood tests that check for: ? Low iron levels (anemia). ? High blood sugar that affects pregnant women (gestational diabetes) between 58 and 28 weeks. ? Rh antibodies. This is to check for a protein on red blood cells (Rh factor).  Urine tests to check for infections, diabetes, or protein in the urine.  An ultrasound to confirm the proper growth and development of the baby.  An amniocentesis to check for possible genetic problems.  Fetal screens for spina bifida and Down syndrome.  HIV (human immunodeficiency virus) testing. Routine prenatal testing includes screening for HIV, unless you choose not to have this test. Follow these instructions at home: Medicines  Follow your health care provider's instructions regarding medicine use. Specific medicines may be either safe or unsafe to take during  pregnancy.  Take a prenatal vitamin that contains at least 600 micrograms (mcg) of folic acid.  If you develop constipation, try taking a stool softener if your health care provider approves. Eating and drinking   Eat a balanced diet that includes fresh fruits and vegetables, whole grains, good sources of protein such as meat, eggs, or tofu, and low-fat dairy. Your health care provider will help you determine the amount of weight gain that is right for you.  Avoid raw meat and uncooked cheese. These carry germs that can cause birth defects in the baby.  If you have low calcium intake from food, talk to your health care provider about whether you should take a daily calcium supplement.  Limit foods that are high in fat and processed sugars, such as fried and sweet foods.  To prevent constipation: ? Drink enough fluid to keep your urine clear  or pale yellow. ? Eat foods that are high in fiber, such as fresh fruits and vegetables, whole grains, and beans. Activity  Exercise only as directed by your health care provider. Most women can continue their usual exercise routine during pregnancy. Try to exercise for 30 minutes at least 5 days a week. Stop exercising if you experience uterine contractions.  Avoid heavy lifting, wear low heel shoes, and practice good posture.  A sexual relationship may be continued unless your health care provider directs you otherwise. Relieving pain and discomfort  Wear a good support bra to prevent discomfort from breast tenderness.  Take warm sitz baths to soothe any pain or discomfort caused by hemorrhoids. Use hemorrhoid cream if your health care provider approves.  Rest with your legs elevated if you have leg cramps or low back pain.  If you develop varicose veins, wear support hose. Elevate your feet for 15 minutes, 3-4 times a day. Limit salt in your diet. Prenatal Care  Write down your questions. Take them to your prenatal visits.  Keep all your  prenatal visits as told by your health care provider. This is important. Safety  Wear your seat belt at all times when driving.  Make a list of emergency phone numbers, including numbers for family, friends, the hospital, and police and fire departments. General instructions  Ask your health care provider for a referral to a local prenatal education class. Begin classes no later than the beginning of month 6 of your pregnancy.  Ask for help if you have counseling or nutritional needs during pregnancy. Your health care provider can offer advice or refer you to specialists for help with various needs.  Do not use hot tubs, steam rooms, or saunas.  Do not douche or use tampons or scented sanitary pads.  Do not cross your legs for long periods of time.  Avoid cat litter boxes and soil used by cats. These carry germs that can cause birth defects in the baby and possibly loss of the fetus by miscarriage or stillbirth.  Avoid all smoking, herbs, alcohol, and unprescribed drugs. Chemicals in these products can affect the formation and growth of the baby.  Do not use any products that contain nicotine or tobacco, such as cigarettes and e-cigarettes. If you need help quitting, ask your health care provider.  Visit your dentist if you have not gone yet during your pregnancy. Use a soft toothbrush to brush your teeth and be gentle when you floss. Contact a health care provider if:  You have dizziness.  You have mild pelvic cramps, pelvic pressure, or nagging pain in the abdominal area.  You have persistent nausea, vomiting, or diarrhea.  You have a bad smelling vaginal discharge.  You have pain when you urinate. Get help right away if:  You have a fever.  You are leaking fluid from your vagina.  You have spotting or bleeding from your vagina.  You have severe abdominal cramping or pain.  You have rapid weight gain or weight loss.  You have shortness of breath with chest  pain.  You notice sudden or extreme swelling of your face, hands, ankles, feet, or legs.  You have not felt your baby move in over an hour.  You have severe headaches that do not go away when you take medicine.  You have vision changes. Summary  The second trimester is from week 14 through week 27 (months 4 through 6). It is also a time when the fetus is growing rapidly.  Your body goes through many changes during pregnancy. The changes vary from woman to woman.  Avoid all smoking, herbs, alcohol, and unprescribed drugs. These chemicals affect the formation and growth your baby.  Do not use any tobacco products, such as cigarettes, chewing tobacco, and e-cigarettes. If you need help quitting, ask your health care provider.  Contact your health care provider if you have any questions. Keep all prenatal visits as told by your health care provider. This is important. This information is not intended to replace advice given to you by your health care provider. Make sure you discuss any questions you have with your health care provider. Document Revised: 06/06/2018 Document Reviewed: 03/20/2016 Elsevier Patient Education  [place]: You are not alone, Seventy-five percent of women have some sort of abdominal or back pain at some point in their pregnancy. Your baby is growing at a fast pace, which means that your whole body is rapidly trying to adjust to the changes. As your uterus grows, your back may start feeling a bit under stress and this can result in back or abdominal pain that can go from mild, and therefore bearable, to severe pains that will not allow you to sit or lay down comfortably, When it comes to dealing with pregnancy-related pains and cramps, some pregnant women usually prefer natural remedies, which the market is filled with nowadays. For example, wearing a pregnancy support belt can help ease and lessen your discomfort and pain. WHAT  ARE THE BENEFITS OF WEARING A PREGNANCY SUPPORT BELT? A pregnancy support belt provides support to the lower portion of the belly taking some of the weight of the growing uterus and distributing to the other parts of your body. It is designed make you comfortable and gives you extra support. Over the years, the pregnancy apparel market has been studying the needs and wants of pregnant women and they have come up with the most comfortable pregnancy support belts that woman could ever ask for. In fact, you will no longer have to wear a stretched-out or bulky pregnancy belt that is visible underneath your clothes and makes you feel even more uncomfortable. Nowadays, a pregnancy support belt is made of comfortable and stretchy materials that will not irritate your skin but will actually make you feel at ease and you will not even notice you are wearing it. They are easy to put on and adjust during the day and can be worn at night for additional support.  BENEFITS: . Relives Back pain . Relieves Abdominal Muscle and Leg Pain . Stabilizes the Pelvic Ring . Offers a Cushioned Abdominal Lift Pad . Relieves pressure on the Sciatic Nerve Within Minutes WHERE TO GET YOUR PREGNANCY BELT: [employer] [phone] @[phone]  [place] [place], [place] [id number]    Abdominal Pain During Pregnancy  Abdominal pain is common during pregnancy, and has many possible causes. Some causes are more serious than others, and sometimes the cause is not known. Abdominal pain can be a sign that labor is starting. It can also be caused by normal growth and stretching of muscles and ligaments during pregnancy. Always tell your health care provider if you have any abdominal pain. Follow these instructions at home:  Do not have sex or put anything in your vagina until your pain goes away completely.  Get plenty of rest until your pain improves.  Drink enough fluid to keep your urine pale yellow.  Take  over-the-counter  and prescription medicines only as told by your health care provider.  Keep all follow-up visits as told by your health care provider. This is important. Contact a health care provider if:  Your pain continues or gets worse after resting.  You have lower abdominal pain that: ? Comes and goes at regular intervals. ? Spreads to your back. ? Is similar to menstrual cramps.  You have pain or burning when you urinate. Get help right away if:  You have a fever or chills.  You have vaginal bleeding.  You are leaking fluid from your vagina.  You are passing tissue from your vagina.  You have vomiting or diarrhea that lasts for more than 24 hours.  Your baby is moving less than usual.  You feel very weak or faint.  You have shortness of breath.  You develop severe pain in your upper abdomen. Summary  Abdominal pain is common during pregnancy, and has many possible causes.  If you experience abdominal pain during pregnancy, tell your health care provider right away.  Follow your health care provider's home care instructions and keep all follow-up visits as directed. This information is not intended to replace advice given to you by your health care provider. Make sure you discuss any questions you have with your health care provider. Document Revised: 06/02/2018 Document Reviewed: 05/17/2016 Elsevier Patient Education  2020 [place] [D] Immune Globulin injection What is this medicine? RhO [D] IMMUNE GLOBULIN (i MYOON GLOB yoo lin) is used to treat idiopathic thrombocytopenic purpura (ITP). This medicine is used in RhO negative mothers who are pregnant with a RhO positive child. It is also used after a transfusion of RhO positive blood into a RhO negative person. This medicine may be used for other purposes; ask your health care provider or pharmacist if you have questions. COMMON BRAND NAME(S): BayRho-D, HyperRHO S/D, MICRhoGAM, RhoGAM, Rhophylac, WinRho  SDF What should I tell my health care provider before I take this medicine? They need to know if you have any of these conditions:  bleeding disorders  low levels of immunoglobulin A in the body  no spleen  an unusual or allergic reaction to human immune globulin, other medicines, foods, dyes, or preservatives  pregnant or trying to get pregnant  breast-feeding How should I use this medicine? This medicine is for injection into a muscle or into a vein. It is given by a health care professional in a hospital or clinic setting. Talk to your pediatrician regarding the use of this medicine in children. This medicine is not approved for use in children. Overdosage: If you think you have taken too much of this medicine contact a poison control center or emergency room at once. NOTE: This medicine is only for you. Do not share this medicine with others. What if I miss a dose? It is important not to miss your dose. Call your doctor or health care professional if you are unable to keep an appointment. What may interact with this medicine?  live virus vaccines, like measles, mumps, or rubella This list may not describe all possible interactions. Give your health care provider a list of all the medicines, herbs, non-prescription drugs, or dietary supplements you use. Also tell them if you smoke, drink alcohol, or use illegal drugs. Some items may interact with your medicine. What should I watch for while using this medicine? This medicine is made from human blood. It may be possible to pass an infection in this medicine. Talk to your doctor  about the risks and benefits of this medicine. This medicine may interfere with live virus vaccines. Before you get live virus vaccines tell your health care professional if you have received this medicine within the past 3 months. What side effects may I notice from receiving this medicine? Side effects that you should report to your doctor or health care  professional as soon as possible:  allergic reactions like skin rash, itching or hives, swelling of the face, lips, or tongue  breathing problems  chest pain or tightness  yellowing of the eyes or skin Side effects that usually do not require medical attention (report to your doctor or health care professional if they continue or are bothersome):  fever  pain and tenderness at site where injected This list may not describe all possible side effects. Call your doctor for medical advice about side effects. You may report side effects to FDA at 1-800-FDA-1088. Where should I keep my medicine? This drug is given in a hospital or clinic and will not be stored at home. NOTE: This sheet is a summary. It may not cover all possible information. If you have questions about this medicine, talk to your doctor, pharmacist, or health care provider.  2020 Elsevier/Gold Standard (2007-10-13 14:06:10)  Second Trimester of Pregnancy The second trimester is from week 14 through week 27 (months 4 through 6). The second trimester is often a time when you feel your best. Your body has adjusted to being pregnant, and you begin to feel better physically. Usually, morning sickness has lessened or quit completely, you may have more energy, and you may have an increase in appetite. The second trimester is also a time when the fetus is growing rapidly. At the end of the sixth month, the fetus is about 9 inches long and weighs about 1 pounds. You will likely begin to feel the baby move (quickening) between 16 and 20 weeks of pregnancy. Body changes during your second trimester Your body continues to go through many changes during your second trimester. The changes vary from woman to woman.  Your weight will continue to increase. You will notice your lower abdomen bulging out.  You may begin to get stretch marks on your hips, abdomen, and breasts.  You may develop headaches that can be relieved by medicines. The  medicines should be approved by your health care provider.  You may urinate more often because the fetus is pressing on your bladder.  You may develop or continue to have heartburn as a result of your pregnancy.  You may develop constipation because certain hormones are causing the muscles that push waste through your intestines to slow down.  You may develop hemorrhoids or swollen, bulging veins (varicose veins).  You may have back pain. This is caused by: ? Weight gain. ? Pregnancy hormones that are relaxing the joints in your pelvis. ? A shift in weight and the muscles that support your balance.  Your breasts will continue to grow and they will continue to become tender.  Your gums may bleed and may be sensitive to brushing and flossing.  Dark spots or blotches (chloasma, mask of pregnancy) may develop on your face. This will likely fade after the baby is born.  A dark line from your belly button to the pubic area (linea nigra) may appear. This will likely fade after the baby is born.  You may have changes in your hair. These can include thickening of your hair, rapid growth, and changes in texture. Some women  also have hair loss during or after pregnancy, or hair that feels dry or thin. Your hair will most likely return to normal after your baby is born. What to expect at prenatal visits During a routine prenatal visit:  You will be weighed to make sure you and the fetus are growing normally.  Your blood pressure will be taken.  Your abdomen will be measured to track your baby's growth.  The fetal heartbeat will be listened to.  Any test results from the previous visit will be discussed. Your health care provider may ask you:  How you are feeling.  If you are feeling the baby move.  If you have had any abnormal symptoms, such as leaking fluid, bleeding, severe headaches, or abdominal cramping.  If you are using any tobacco products, including cigarettes, chewing  tobacco, and electronic cigarettes.  If you have any questions. Other tests that may be performed during your second trimester include:  Blood tests that check for: ? Low iron levels (anemia). ? High blood sugar that affects pregnant women (gestational diabetes) between 65 and 28 weeks. ? Rh antibodies. This is to check for a protein on red blood cells (Rh factor).  Urine tests to check for infections, diabetes, or protein in the urine.  An ultrasound to confirm the proper growth and development of the baby.  An amniocentesis to check for possible genetic problems.  Fetal screens for spina bifida and Down syndrome.  HIV (human immunodeficiency virus) testing. Routine prenatal testing includes screening for HIV, unless you choose not to have this test. Follow these instructions at home: Medicines  Follow your health care provider's instructions regarding medicine use. Specific medicines may be either safe or unsafe to take during pregnancy.  Take a prenatal vitamin that contains at least 600 micrograms (mcg) of folic acid.  If you develop constipation, try taking a stool softener if your health care provider approves. Eating and drinking   Eat a balanced diet that includes fresh fruits and vegetables, whole grains, good sources of protein such as meat, eggs, or tofu, and low-fat dairy. Your health care provider will help you determine the amount of weight gain that is right for you.  Avoid raw meat and uncooked cheese. These carry germs that can cause birth defects in the baby.  If you have low calcium intake from food, talk to your health care provider about whether you should take a daily calcium supplement.  Limit foods that are high in fat and processed sugars, such as fried and sweet foods.  To prevent constipation: ? Drink enough fluid to keep your urine clear or pale yellow. ? Eat foods that are high in fiber, such as fresh fruits and vegetables, whole grains, and  beans. Activity  Exercise only as directed by your health care provider. Most women can continue their usual exercise routine during pregnancy. Try to exercise for 30 minutes at least 5 days a week. Stop exercising if you experience uterine contractions.  Avoid heavy lifting, wear low heel shoes, and practice good posture.  A sexual relationship may be continued unless your health care provider directs you otherwise. Relieving pain and discomfort  Wear a good support bra to prevent discomfort from breast tenderness.  Take warm sitz baths to soothe any pain or discomfort caused by hemorrhoids. Use hemorrhoid cream if your health care provider approves.  Rest with your legs elevated if you have leg cramps or low back pain.  If you develop varicose veins, wear support hose.  Elevate your feet for 15 minutes, 3-4 times a day. Limit salt in your diet. Prenatal Care  Write down your questions. Take them to your prenatal visits.  Keep all your prenatal visits as told by your health care provider. This is important. Safety  Wear your seat belt at all times when driving.  Make a list of emergency phone numbers, including numbers for family, friends, the hospital, and police and fire departments. General instructions  Ask your health care provider for a referral to a local prenatal education class. Begin classes no later than the beginning of month 6 of your pregnancy.  Ask for help if you have counseling or nutritional needs during pregnancy. Your health care provider can offer advice or refer you to specialists for help with various needs.  Do not use hot tubs, steam rooms, or saunas.  Do not douche or use tampons or scented sanitary pads.  Do not cross your legs for long periods of time.  Avoid cat litter boxes and soil used by cats. These carry germs that can cause birth defects in the baby and possibly loss of the fetus by miscarriage or stillbirth.  Avoid all smoking, herbs,  alcohol, and unprescribed drugs. Chemicals in these products can affect the formation and growth of the baby.  Do not use any products that contain nicotine or tobacco, such as cigarettes and e-cigarettes. If you need help quitting, ask your health care provider.  Visit your dentist if you have not gone yet during your pregnancy. Use a soft toothbrush to brush your teeth and be gentle when you floss. Contact a health care provider if:  You have dizziness.  You have mild pelvic cramps, pelvic pressure, or nagging pain in the abdominal area.  You have persistent nausea, vomiting, or diarrhea.  You have a bad smelling vaginal discharge.  You have pain when you urinate. Get help right away if:  You have a fever.  You are leaking fluid from your vagina.  You have spotting or bleeding from your vagina.  You have severe abdominal cramping or pain.  You have rapid weight gain or weight loss.  You have shortness of breath with chest pain.  You notice sudden or extreme swelling of your face, hands, ankles, feet, or legs.  You have not felt your baby move in over an hour.  You have severe headaches that do not go away when you take medicine.  You have vision changes. Summary  The second trimester is from week 14 through week 27 (months 4 through 6). It is also a time when the fetus is growing rapidly.  Your body goes through many changes during pregnancy. The changes vary from woman to woman.  Avoid all smoking, herbs, alcohol, and unprescribed drugs. These chemicals affect the formation and growth your baby.  Do not use any tobacco products, such as cigarettes, chewing tobacco, and e-cigarettes. If you need help quitting, ask your health care provider.  Contact your health care provider if you have any questions. Keep all prenatal visits as told by your health care provider. This is important. This information is not intended to replace advice given to you by your health care  provider. Make sure you discuss any questions you have with your health care provider. Document Revised: 06/06/2018 Document Reviewed: 03/20/2016 Elsevier Patient Education  [place].

## 2019-04-07 ENCOUNTER — Telehealth: Payer: Self-pay | Admitting: Family Medicine

## 2019-04-07 LAB — KLEIHAUER-BETKE STAIN
# Vials RhIg: 1
Fetal Cells %: 0 %
Quantitation Fetal Hemoglobin: 0 mL

## 2019-04-07 LAB — CULTURE, OB URINE: Culture: NO GROWTH

## 2019-04-07 LAB — RH IG WORKUP (INCLUDES ABO/RH)
ABO/RH(D): O NEG
Antibody Screen: NEGATIVE
Fetal Screen: NEGATIVE
Gestational Age(Wks): 27.2
Unit division: 0

## 2019-04-07 NOTE — Telephone Encounter (Signed)
Attempted to contact patient to get her scheduled for ob care ASAP. No answer and unable to to leave a voicemail due to it not being set-up. Will reach out to patient again later in the week.

## 2019-04-13 ENCOUNTER — Other Ambulatory Visit: Payer: Self-pay

## 2019-04-13 ENCOUNTER — Encounter: Payer: Self-pay | Admitting: Family Medicine

## 2019-04-13 ENCOUNTER — Ambulatory Visit (INDEPENDENT_AMBULATORY_CARE_PROVIDER_SITE_OTHER): Payer: BC Managed Care – PPO | Admitting: Family Medicine

## 2019-04-13 VITALS — BP 106/69 | HR 81 | Wt 150.0 lb

## 2019-04-13 DIAGNOSIS — O26899 Other specified pregnancy related conditions, unspecified trimester: Secondary | ICD-10-CM

## 2019-04-13 DIAGNOSIS — F4310 Post-traumatic stress disorder, unspecified: Secondary | ICD-10-CM

## 2019-04-13 DIAGNOSIS — O99343 Other mental disorders complicating pregnancy, third trimester: Secondary | ICD-10-CM

## 2019-04-13 DIAGNOSIS — Z3A28 28 weeks gestation of pregnancy: Secondary | ICD-10-CM

## 2019-04-13 DIAGNOSIS — O0990 Supervision of high risk pregnancy, unspecified, unspecified trimester: Secondary | ICD-10-CM

## 2019-04-13 DIAGNOSIS — Z6791 Unspecified blood type, Rh negative: Secondary | ICD-10-CM

## 2019-04-13 DIAGNOSIS — Z23 Encounter for immunization: Secondary | ICD-10-CM

## 2019-04-13 DIAGNOSIS — O0933 Supervision of pregnancy with insufficient antenatal care, third trimester: Secondary | ICD-10-CM

## 2019-04-13 DIAGNOSIS — F419 Anxiety disorder, unspecified: Secondary | ICD-10-CM

## 2019-04-13 DIAGNOSIS — O0973 Supervision of high risk pregnancy due to social problems, third trimester: Secondary | ICD-10-CM

## 2019-04-13 DIAGNOSIS — D681 Hereditary factor XI deficiency: Secondary | ICD-10-CM

## 2019-04-13 DIAGNOSIS — F333 Major depressive disorder, recurrent, severe with psychotic symptoms: Secondary | ICD-10-CM

## 2019-04-13 DIAGNOSIS — O26893 Other specified pregnancy related conditions, third trimester: Secondary | ICD-10-CM

## 2019-04-13 HISTORY — DX: Supervision of high risk pregnancy, unspecified, unspecified trimester: O09.90

## 2019-04-13 NOTE — Progress Notes (Signed)
Subjective:  Alice Castillo is a [id number] [redacted]w[redacted]d being seen today for her first obstetrical visit.  Her obstetrical history is significant for vaginal delivery with first baby. Has partial Factor XI deficiency. Had insurance problems and couldn't get in for care.. Patient does intend to breast feed. Pregnancy history fully reviewed.  Patient reports no complaints.  BP 106/69   Pulse 81   Wt 150 lb (68 kg)   BMI 24.21 kg/m   HISTORY: OB History  Gravida Para Term Preterm AB Living  4 1 1   2 1   SAB TAB Ectopic Multiple Live Births    2   0 1    # Outcome Date GA Lbr Len/2nd Weight Sex Delivery Anes PTL Lv  4 Current           3 TAB 2018          2 TAB 2018          1 Term 06/07/15 [redacted]w[redacted]d 15:33 / 00:31 8 lb 9.6 oz (3.901 kg) M Vag-Spont None  LIV    Past Medical History:  Diagnosis Date  . Anxiety   . Bipolar 1 disorder ([place])   . Chlamydia infection   . Depression   . Factor XI deficiency ([place])   . Headache     Past Surgical History:  Procedure Laterality Date  . NO PAST SURGERIES      Family History  Problem Relation Age of Onset  . Clotting disorder Father        Factor XI deficiency (affected or carrier?)  . Heart disease Maternal Uncle   . Diabetes Maternal Uncle   . Stroke Maternal Uncle   . Heart disease Paternal Uncle   . Diabetes Paternal Uncle   . Clotting disorder Paternal Uncle        Factor XI (37) deficiency  . Clotting disorder Paternal Uncle        Factor XI deficiency  . Diabetes Maternal Grandmother   . Heart disease Maternal Grandmother   . Stroke Maternal Grandmother   . Diabetes Maternal Grandfather   . Heart disease Maternal Grandfather   . Stroke Maternal Grandfather      Exam  BP 106/69   Pulse 81   Wt 150 lb (68 kg)   BMI 24.21 kg/m   Chaperone present during exam  CONSTITUTIONAL: Well-developed, well-nourished female in no acute distress.  HENT:  Normocephalic, atraumatic, External right and left ear normal. Oropharynx is  clear and moist EYES: Conjunctivae and EOM are normal. Pupils are equal, round, and reactive to light. No scleral icterus.  NECK: Normal range of motion, supple, no masses.  Normal thyroid.  CARDIOVASCULAR: Normal heart rate noted, regular rhythm RESPIRATORY: Clear to auscultation bilaterally. Effort and breath sounds normal, no problems with respiration noted. ABDOMEN: Soft, normal bowel sounds, no distention noted.  No tenderness, rebound or guarding.  MUSCULOSKELETAL: Normal range of motion. No tenderness.  No cyanosis, clubbing, or edema.  2+ distal pulses. SKIN: Skin is warm and dry. No rash noted. Not diaphoretic. No erythema. No pallor. NEUROLOGIC: Alert and oriented to person, place, and time. Normal reflexes, muscle tone coordination. No cranial nerve deficit noted. PSYCHIATRIC: Normal mood and affect. Normal behavior. Normal judgment and thought content.    Assessment:    Pregnancy: [id number] Patient Active Problem List   Diagnosis Date Noted  . Abnormal vaginal bleeding 08/09/2015  . Third degree perineal laceration during delivery, IIIa 06/09/2015  . Active labor 06/07/2015  . Factor XI deficiency (  [place]) 05/11/2015  . Family history of genetic disorder 04/07/2015  . Chlamydia infection affecting pregnancy 04/05/2015  . Bleeding tendency ([place]) 04/01/2015  . Rh negative state in antepartum period 03/22/2015  . Family history of clotting disorder 03/22/2015  . Paresthesias in antepartum period in third trimester 03/22/2015  . Supervision of high risk pregnancy due to social problems 01/24/2015  . Anxiety during pregnancy, antepartum 01/24/2015  . PTSD (post-traumatic stress disorder) 09/22/2014  . 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 Hallucinations       Plan:   1. Supervision of high risk pregnancy due to social problems in third trimester FHT and FH normal  2. Late prenatal care affecting pregnancy in third  trimester PNL today.  Return in 2-3 days for 2hr GTT  - Culture, OB Urine - Obstetric Panel, Including HIV - Hemoglobin A1c  3. Factor XI deficiency ([place]) Was seen proir by hematology (prior to last delivery). Recommended antifibrinolytics after delivery (Lysteda 1500mg  BID x5-7 days) for vaginal delivery. If the patient needs to go for a C-section would recommend prophylactic and maintenance replacement. 10-20 ml/kg of FFP starting pre/intra-operatively and then 5-47ml/kg q24h post-operative for 3 doses. (longer if evidence of significant surgical bleeding) with fibrinolytics   4. Rh negative state in antepartum period Rhogam given in hospital  5. Anxiety during pregnancy, antepartum Declines visit with Jamie  6. PTSD (post-traumatic stress disorder)  7. Major depressive disorder, recurrent, severe with psychotic features ([place])      Problem list reviewed and updated. 75% of 30 min visit spent on counseling and coordination of care.     Truett Mainland 04/13/2019

## 2019-04-13 NOTE — Progress Notes (Signed)
Recieved rhogam on 04/06/2019 in MAU

## 2019-04-15 ENCOUNTER — Other Ambulatory Visit: Payer: Self-pay | Admitting: *Deleted

## 2019-04-15 DIAGNOSIS — O0990 Supervision of high risk pregnancy, unspecified, unspecified trimester: Secondary | ICD-10-CM

## 2019-04-15 LAB — OBSTETRIC PANEL, INCLUDING HIV
Basophils Absolute: 0 10*3/uL (ref 0.0–0.2)
Basos: 0 %
EOS (ABSOLUTE): 0.2 10*3/uL (ref 0.0–0.4)
Eos: 3 %
HIV Screen 4th Generation wRfx: NONREACTIVE
Hematocrit: 30.5 % — ABNORMAL LOW (ref 34.0–46.6)
Hemoglobin: 10.6 g/dL — ABNORMAL LOW (ref 11.1–15.9)
Hepatitis B Surface Ag: NEGATIVE
Immature Grans (Abs): 0 10*3/uL (ref 0.0–0.1)
Immature Granulocytes: 1 %
Lymphocytes Absolute: 1 10*3/uL (ref 0.7–3.1)
Lymphs: 15 %
MCH: 31.6 pg (ref 26.6–33.0)
MCHC: 34.8 g/dL (ref 31.5–35.7)
MCV: 91 fL (ref 79–97)
Monocytes Absolute: 0.4 10*3/uL (ref 0.1–0.9)
Monocytes: 7 %
Neutrophils Absolute: 4.8 10*3/uL (ref 1.4–7.0)
Neutrophils: 74 %
Platelets: 173 10*3/uL (ref 150–450)
RBC: 3.35 x10E6/uL — ABNORMAL LOW (ref 3.77–5.28)
RDW: 12.3 % (ref 11.7–15.4)
RPR Ser Ql: NONREACTIVE
Rh Factor: NEGATIVE
Rubella Antibodies, IGG: <0.9 index — ABNORMAL LOW (ref >0.99)
WBC: 6.5 10*3/uL (ref 3.4–10.8)

## 2019-04-15 LAB — HEMOGLOBIN A1C
Est. average glucose Bld gHb Est-mCnc: 85 mg/dL
Hgb A1c MFr Bld: 4.6 % — ABNORMAL LOW (ref 4.8–5.6)

## 2019-04-15 LAB — URINE CULTURE, OB REFLEX

## 2019-04-15 LAB — CULTURE, OB URINE

## 2019-04-15 LAB — AB SCR+ANTIBODY ID: Antibody Screen: POSITIVE — AB

## 2019-04-16 ENCOUNTER — Ambulatory Visit (INDEPENDENT_AMBULATORY_CARE_PROVIDER_SITE_OTHER)
Admission: EM | Admit: 2019-04-16 | Discharge: 2019-04-16 | Disposition: A | Discharge status: Home / Self Care | Payer: BC Managed Care – PPO | Source: Home / Self Care

## 2019-04-16 ENCOUNTER — Encounter (HOSPITAL_COMMUNITY): Payer: Self-pay

## 2019-04-16 ENCOUNTER — Other Ambulatory Visit: Payer: BC Managed Care – PPO

## 2019-04-16 ENCOUNTER — Other Ambulatory Visit: Payer: Self-pay

## 2019-04-16 ENCOUNTER — Encounter (HOSPITAL_COMMUNITY): Payer: Self-pay | Admitting: Obstetrics and Gynecology

## 2019-04-16 ENCOUNTER — Inpatient Hospital Stay (HOSPITAL_COMMUNITY)
Admission: AD | Admit: 2019-04-16 | Discharge: 2019-04-16 | Disposition: A | Discharge status: Home / Self Care | Payer: BC Managed Care – PPO | Attending: Obstetrics and Gynecology | Admitting: Obstetrics and Gynecology

## 2019-04-16 DIAGNOSIS — N898 Other specified noninflammatory disorders of vagina: Secondary | ICD-10-CM

## 2019-04-16 DIAGNOSIS — O98813 Other maternal infectious and parasitic diseases complicating pregnancy, third trimester: Secondary | ICD-10-CM | POA: Diagnosis not present

## 2019-04-16 DIAGNOSIS — Z3A28 28 weeks gestation of pregnancy: Secondary | ICD-10-CM | POA: Diagnosis not present

## 2019-04-16 DIAGNOSIS — B373 Candidiasis of vulva and vagina: Secondary | ICD-10-CM | POA: Insufficient documentation

## 2019-04-16 DIAGNOSIS — O23593 Infection of other part of genital tract in pregnancy, third trimester: Secondary | ICD-10-CM | POA: Insufficient documentation

## 2019-04-16 DIAGNOSIS — O368120 Decreased fetal movements, second trimester, not applicable or unspecified: Secondary | ICD-10-CM | POA: Insufficient documentation

## 2019-04-16 DIAGNOSIS — Z3A30 30 weeks gestation of pregnancy: Secondary | ICD-10-CM | POA: Diagnosis not present

## 2019-04-16 DIAGNOSIS — O26893 Other specified pregnancy related conditions, third trimester: Secondary | ICD-10-CM | POA: Insufficient documentation

## 2019-04-16 DIAGNOSIS — B3731 Acute candidiasis of vulva and vagina: Secondary | ICD-10-CM

## 2019-04-16 LAB — WET PREP, GENITAL
Clue Cells Wet Prep HPF POC: NONE SEEN
Sperm: NONE SEEN
Trich, Wet Prep: NONE SEEN

## 2019-04-16 LAB — URINALYSIS, ROUTINE W REFLEX MICROSCOPIC
Bilirubin Urine: NEGATIVE
Glucose, UA: NEGATIVE mg/dL
Hgb urine dipstick: NEGATIVE
Ketones, ur: NEGATIVE mg/dL
Leukocytes,Ua: NEGATIVE
Nitrite: NEGATIVE
Protein, ur: NEGATIVE mg/dL
Specific Gravity, Urine: 1.016 (ref 1.005–1.030)
pH: 8 (ref 5.0–8.0)

## 2019-04-16 MED ORDER — TERCONAZOLE 0.4 % VA CREA: 1.0000 | TOPICAL_CREAM | Freq: Every day | VAGINAL | 0 refills | Status: DC

## 2019-04-16 NOTE — ED Provider Notes (Signed)
[place]    CSN: [id number] Arrival date & time: 04/16/19  1418      History   Chief Complaint Chief Complaint  Patient presents with  . Vaginal Discharge    HPI Alice Castillo is a 24 y.o. female.   Pt is a [redacted] week pregnant female that presents with vaginal discharge.  Describes the discharge as "heavy globs and thick" started last night.  Concerned it may be her mucous plug.  Denies any leaking of clear fluid, odor, vaginal itching or irritation.  No abdominal pain or vaginal bleeding.  Concerned for decreased fetal movement.   ROS per HPI      Past Medical History:  Diagnosis Date  . Anxiety   . Bipolar 1 disorder ([place])   . Chlamydia infection   . Depression   . Factor XI deficiency ([place])   . Headache     Patient Active Problem List   Diagnosis Date Noted  . Supervision of high risk pregnancy, antepartum 04/13/2019  . Factor XI deficiency ([place]) 05/11/2015  . Family history of genetic disorder 04/07/2015  . Rh negative state in antepartum period 03/22/2015  . Family history of clotting disorder 03/22/2015  . Paresthesias in antepartum period in third trimester 03/22/2015  . Anxiety during pregnancy, antepartum 01/24/2015  . PTSD (post-traumatic stress disorder) 09/22/2014  . 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 Hallucinations     Past Surgical History:  Procedure Laterality Date  . NO PAST SURGERIES      OB History    Gravida  4   Para  1   Term  1   Preterm      AB  2   Living  1     SAB      TAB  2   Ectopic      Multiple  0   Live Births  1            Home Medications    Prior to Admission medications   Medication Sig Start Date End Date Taking? Authorizing Provider  Prenatal Vit-Fe Fumarate-FA (PRENATAL MULTIVITAMIN) TABS tablet Take 1 tablet by mouth daily at 12 noon.   Yes [provider]    Family History Family History  Problem Relation Age  of Onset  . Clotting disorder Father        Factor XI deficiency (affected or carrier?)  . Heart disease Maternal Uncle   . Diabetes Maternal Uncle   . Stroke Maternal Uncle   . Heart disease Paternal Uncle   . Diabetes Paternal Uncle   . Clotting disorder Paternal Uncle        Factor XI (53) deficiency  . Clotting disorder Paternal Uncle        Factor XI deficiency  . Diabetes Maternal Grandmother   . Heart disease Maternal Grandmother   . Stroke Maternal Grandmother   . Diabetes Maternal Grandfather   . Heart disease Maternal Grandfather   . Stroke Maternal Grandfather     Social History Social History   Tobacco Use  . Smoking status: Passive Smoke Exposure - Never Smoker  . Smokeless tobacco: Never Used  Substance Use Topics  . Alcohol use: Yes    Alcohol/week: 1.0 standard drinks    Types: 1 Shots of liquor per week    Comment: stopped when found out pregnant  . Drug use: No     Allergies   Food and Lactose intolerance (gi)   Review  of Systems Review of Systems   Physical Exam Triage Vital Signs ED Triage Vitals  Enc Vitals Group     BP      Pulse      Resp      Temp      Temp src      SpO2      Weight      Height      Head Circumference      Peak Flow      Pain Score      Pain Loc      Pain Edu?      Excl. in [place]?    No data found.  Updated Vital Signs BP 112/71 (BP Location: Right Arm)   Pulse 72   Temp 98.4 F (36.9 C) (Oral)   Resp 16   SpO2 100%   Visual Acuity Right Eye Distance:   Left Eye Distance:   Bilateral Distance:    Right Eye Near:   Left Eye Near:    Bilateral Near:     Physical Exam Vitals and nursing note reviewed.  Constitutional:      General: She is not in acute distress.    Appearance: Normal appearance. She is not ill-appearing, toxic-appearing or diaphoretic.  HENT:     Head: Normocephalic.     Nose: Nose normal.  Eyes:     Conjunctiva/sclera: Conjunctivae normal.  Pulmonary:     Effort: Pulmonary  effort is normal.  Musculoskeletal:        General: Normal range of motion.     Cervical back: Normal range of motion.  Skin:    General: Skin is warm and dry.     Findings: No rash.  Neurological:     Mental Status: She is alert.  Psychiatric:        Mood and Affect: Mood normal.      UC Treatments / Results  Labs (all labs ordered are listed, but only abnormal results are displayed) Labs Reviewed - No data to display  EKG   Radiology No results found.  Procedures Procedures (including critical care time)  Medications Ordered in UC Medications - No data to display  Initial Impression / Assessment and Plan / UC Course  I have reviewed the triage vital signs and the nursing notes.  Pertinent labs & imaging results that were available during my care of the patient were reviewed by me and considered in my medical decision making (see chart for details).     Vaginal discharge-patient showed picture of the discharge and it appears to be a piece of the mucous plug.  Sending to the [facility] hospital for further evaluation based on this and her reporting decreased fetal movement. Not having any contractions or vaginal bleeding. Final Clinical Impressions(s) / UC Diagnoses   Final diagnoses:  Vaginal discharge during pregnancy in third trimester     Discharge Instructions     [facility].      ED Prescriptions    None     PDMP not reviewed this encounter.   Loura Halt A, NP 04/16/19 1507

## 2019-04-16 NOTE — ED Notes (Signed)
Patient is being discharged from the [facility] and sent to the [facility]U. Per Traci, patient is stable but in need of higher level of care due to reports of decreased fetal movement w/ pregnancy and loss of mucous plug. Patient is aware and verbalizes understanding of plan of care.  Vitals:   04/16/19 1450  BP: 112/71  Pulse: 72  Resp: 16  Temp: 98.4 F (36.9 C)  SpO2: 100%

## 2019-04-16 NOTE — MAU Note (Signed)
Pt states that at midnight she had a mig glob of mucous come out.   Pt reports more mucous coming out today.   Pt reports decreased fetal movement since this morning. Pt reports last feeling the baby move 30 minutes ago.

## 2019-04-16 NOTE — Discharge Instructions (Addendum)
Go to [facility] hospital for further evaluation.

## 2019-04-16 NOTE — ED Triage Notes (Signed)
Patient presents to Urgent Care with complaints of heavy "globs" of vaginal discharge since midnight last night. Patient reports her mom thinks she may be losing her plug; pt is [redacted] weeks pregnant. States she feels the baby move less than normal, last felt fetal movement a few moments ago but it seemed weaker than normal.

## 2019-04-16 NOTE — MAU Provider Note (Signed)
Chief Complaint:  Decreased Fetal Movement   First Provider Initiated Contact with Patient 04/16/19 1621      HPI: Alice Castillo is a 23 y.o. [id number] at [redacted]w[redacted]d by LMP who presents to maternity admissions reporting she passed a large amount of mucus today and has had less fetal movement today. She is feeling more movement in MAU.  There is no pain. There are no other symptoms. She has not tried any treatments.   HPI  Past Medical History: Past Medical History:  Diagnosis Date  . Anxiety   . Bipolar 1 disorder ([place])   . Chlamydia infection   . Depression   . Factor XI deficiency ([place])   . Headache     Past obstetric history: OB History  Gravida Para Term Preterm AB Living  4 1 1   2 1   SAB TAB Ectopic Multiple Live Births    2   0 1    # Outcome Date GA Lbr Len/2nd Weight Sex Delivery Anes PTL Lv  4 Current           3 TAB 2018          2 TAB 2018          1 Term 06/07/15 [redacted]w[redacted]d 15:33 / 00:31 3901 g M Vag-Spont None  LIV    Past Surgical History: Past Surgical History:  Procedure Laterality Date  . NO PAST SURGERIES      Family History: Family History  Problem Relation Age of Onset  . Clotting disorder Father        Factor XI deficiency (affected or carrier?)  . Heart disease Maternal Uncle   . Diabetes Maternal Uncle   . Stroke Maternal Uncle   . Heart disease Paternal Uncle   . Diabetes Paternal Uncle   . Clotting disorder Paternal Uncle        Factor XI (2) deficiency  . Clotting disorder Paternal Uncle        Factor XI deficiency  . Diabetes Maternal Grandmother   . Heart disease Maternal Grandmother   . Stroke Maternal Grandmother   . Diabetes Maternal Grandfather   . Heart disease Maternal Grandfather   . Stroke Maternal Grandfather     Social History: Social History   Tobacco Use  . Smoking status: Passive Smoke Exposure - Never Smoker  . Smokeless tobacco: Never Used  Substance Use Topics  . Alcohol use: Not Currently    Alcohol/week: 1.0  standard drinks    Types: 1 Shots of liquor per week    Comment: stopped when found out pregnant  . Drug use: No    Allergies:  Allergies  Allergen Reactions  . Food     Bananas-throat swells/shortness of breath  . Lactose Intolerance (Gi) Other (See Comments)    Stomach irritation, and diarhea    Meds:  Medications Prior to Admission  Medication Sig Dispense Refill Last Dose  . Prenatal Vit-Fe Fumarate-FA (PRENATAL MULTIVITAMIN) TABS tablet Take 1 tablet by mouth daily at 12 noon.   04/15/2019 at Unknown time    ROS:  Review of Systems  Constitutional: Negative for chills, fatigue and fever.  Eyes: Negative for visual disturbance.  Respiratory: Negative for shortness of breath.   Cardiovascular: Negative for chest pain.  Gastrointestinal: Negative for abdominal pain, nausea and vomiting.  Genitourinary: Positive for vaginal discharge. Negative for difficulty urinating, dysuria, flank pain, pelvic pain, vaginal bleeding and vaginal pain.  Neurological: Negative for dizziness and headaches.  Psychiatric/Behavioral: Negative.  I have reviewed patient's Past Medical Hx, Surgical Hx, Family Hx, Social Hx, medications and allergies.   Physical Exam   Patient Vitals for the past 24 hrs:  BP Temp Pulse Resp SpO2 Weight  04/16/19 1533 115/72 97.6 F (36.4 C) 83 16 100 % --  04/16/19 1524 -- -- -- -- -- 68.6 kg   Constitutional: Well-developed, well-nourished female in no acute distress.  Cardiovascular: normal rate Respiratory: normal effort GI: Abd soft, non-tender, gravid appropriate for gestational age.  MS: Extremities nontender, no edema, normal ROM Neurologic: Alert and oriented x 4.  GU: Neg CVAT.  PELVIC EXAM: Cervix pink, visually closed, without lesion, scant white creamy discharge, vaginal walls and external genitalia normal Bimanual exam: Cervix 0/long/high, firm, anterior, neg CMT, uterus nontender, nonenlarged, adnexa without tenderness, enlargement, or  mass  Dilation: Closed Effacement (%): Thick Exam by:: Fatima Blank, CNM  FHT:  Baseline 145, moderate variability, accelerations present, no decelerations Contractions: None on toco or to palpation   Labs: No results found for this or any previous visit (from the past 24 hour(s)). O/Negative/-- (02/15 1414)  Imaging:    MAU Course/MDM: Orders Placed This Encounter  Procedures  . Wet prep, genital  . Urinalysis, Routine w reflex microscopic    No orders of the defined types were placed in this encounter.    NST reviewed and reactive Pt with normal fetal movement now in MAU Cervix closed/thick with small amount mucus noted in vaginal vault No evidence of preterm labor Yeast noted on wet prep Will treat yeast infection related to large amount of discharge seen by pt Terazol 7 sent to pharmacy Pt to keep scheduled appts in office, return to MAU for s/sx of preterm labor    Assessment:  1. Vaginal candidiasis    Plan: Discharge home Labor precautions and fetal kick counts  Allergies as of 04/16/2019      Reactions   Food    Bananas-throat swells/shortness of breath   Lactose Intolerance (gi) Other (See Comments)   Stomach irritation, and diarhea      Medication List    TAKE these medications   prenatal multivitamin Tabs tablet Take 1 tablet by mouth daily at 12 noon.   terconazole 0.4 % vaginal cream Commonly known as: Terazol 7 Place 1 applicator vaginally at bedtime.       Fatima Blank Certified Nurse-Midwife 04/16/2019 4:22 PM

## 2019-04-17 ENCOUNTER — Other Ambulatory Visit: Payer: BC Managed Care – PPO

## 2019-04-17 DIAGNOSIS — O0990 Supervision of high risk pregnancy, unspecified, unspecified trimester: Secondary | ICD-10-CM

## 2019-04-17 LAB — GC/CHLAMYDIA PROBE AMP (~~LOC~~) NOT AT ARMC
Chlamydia: NEGATIVE
Comment: NEGATIVE
Comment: NORMAL
Neisseria Gonorrhea: NEGATIVE

## 2019-04-18 LAB — HIV ANTIBODY (ROUTINE TESTING W REFLEX): HIV Screen 4th Generation wRfx: NONREACTIVE

## 2019-04-18 LAB — CBC
Hematocrit: 32.6 % — ABNORMAL LOW (ref 34.0–46.6)
Hemoglobin: 11 g/dL — ABNORMAL LOW (ref 11.1–15.9)
MCH: 31.7 pg (ref 26.6–33.0)
MCHC: 33.7 g/dL (ref 31.5–35.7)
MCV: 94 fL (ref 79–97)
Platelets: 177 10*3/uL (ref 150–450)
RBC: 3.47 x10E6/uL — ABNORMAL LOW (ref 3.77–5.28)
RDW: 12.5 % (ref 11.7–15.4)
WBC: 6 10*3/uL (ref 3.4–10.8)

## 2019-04-18 LAB — GLUCOSE TOLERANCE, 2 HOURS W/ 1HR
Glucose, 1 hour: 151 mg/dL (ref 65–179)
Glucose, 2 hour: 100 mg/dL (ref 65–152)
Glucose, Fasting: 79 mg/dL (ref 65–91)

## 2019-04-18 LAB — RPR: RPR Ser Ql: NONREACTIVE

## 2019-04-29 ENCOUNTER — Other Ambulatory Visit: Payer: Self-pay | Admitting: Family Medicine

## 2019-04-29 ENCOUNTER — Ambulatory Visit (INDEPENDENT_AMBULATORY_CARE_PROVIDER_SITE_OTHER): Payer: BC Managed Care – PPO | Admitting: Obstetrics & Gynecology

## 2019-04-29 ENCOUNTER — Ambulatory Visit (HOSPITAL_COMMUNITY)
Admission: RE | Admit: 2019-04-29 | Discharge: 2019-04-29 | Disposition: A | Discharge status: Home / Self Care | Payer: BC Managed Care – PPO | Source: Ambulatory Visit | Attending: Obstetrics and Gynecology | Admitting: Obstetrics and Gynecology

## 2019-04-29 ENCOUNTER — Encounter: Payer: Self-pay | Admitting: Obstetrics & Gynecology

## 2019-04-29 ENCOUNTER — Other Ambulatory Visit: Payer: Self-pay

## 2019-04-29 VITALS — BP 118/71 | HR 74 | Wt 154.0 lb

## 2019-04-29 DIAGNOSIS — D681 Hereditary factor XI deficiency: Secondary | ICD-10-CM

## 2019-04-29 DIAGNOSIS — F419 Anxiety disorder, unspecified: Secondary | ICD-10-CM

## 2019-04-29 DIAGNOSIS — O0973 Supervision of high risk pregnancy due to social problems, third trimester: Secondary | ICD-10-CM | POA: Insufficient documentation

## 2019-04-29 DIAGNOSIS — O0990 Supervision of high risk pregnancy, unspecified, unspecified trimester: Secondary | ICD-10-CM

## 2019-04-29 DIAGNOSIS — Z3A30 30 weeks gestation of pregnancy: Secondary | ICD-10-CM

## 2019-04-29 DIAGNOSIS — O99340 Other mental disorders complicating pregnancy, unspecified trimester: Secondary | ICD-10-CM

## 2019-04-29 DIAGNOSIS — O0933 Supervision of pregnancy with insufficient antenatal care, third trimester: Secondary | ICD-10-CM | POA: Diagnosis not present

## 2019-04-29 DIAGNOSIS — O99343 Other mental disorders complicating pregnancy, third trimester: Secondary | ICD-10-CM

## 2019-04-29 DIAGNOSIS — O0993 Supervision of high risk pregnancy, unspecified, third trimester: Secondary | ICD-10-CM

## 2019-04-29 NOTE — Patient Instructions (Signed)

## 2019-04-29 NOTE — Progress Notes (Signed)
   PRENATAL VISIT NOTE  Subjective:  Alice Castillo is a 24 y.o. [id number] at [redacted]w[redacted]d being seen today for ongoing prenatal care.  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  Patient reports no complaints.  Contractions: Not present. Vag. Bleeding: None.  Movement: Present. Denies leaking of fluid.   The following portions of the patient's history were reviewed and updated as appropriate: allergies, current medications, past family history, past medical history, past social history, past surgical history and problem list.   Objective:   Vitals:   04/29/19 1435  BP: 118/71  Pulse: 74  Weight: 154 lb (69.9 kg)    Fetal Status: Fetal Heart Rate (bpm): 137   Movement: Present     General:  Alert, oriented and cooperative. Patient is in no acute distress.  Skin: Skin is warm and dry. No rash noted.   Cardiovascular: Normal heart rate noted  Respiratory: Normal respiratory effort, no problems with respiration noted  Abdomen: Soft, gravid, appropriate for gestational age.  Pain/Pressure: Present     Pelvic: Cervical exam deferred        Extremities: Normal range of motion.  Edema: None  Mental Status: Normal mood and affect. Normal behavior. Normal judgment and thought content.   Assessment and Plan:  Pregnancy: G4P1021 at [redacted]w[redacted]d 1. Supervision of high risk pregnancy, antepartum  - [facility] AMB BABYSCRIPTS SCHEDULE OPTIMIZATION  2. Anxiety during pregnancy, antepartum Well controlled  3. 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 Please refer to After Visit Summary for other counseling recommendations.   Return in about 2 weeks (around 05/13/2019) for virtual.  No future appointments.  Emeterio Reeve, MD

## 2019-05-01 ENCOUNTER — Telehealth (HOSPITAL_COMMUNITY): Payer: Self-pay | Admitting: Genetic Counselor

## 2019-05-01 NOTE — Telephone Encounter (Addendum)
I called Ms. Mccarrell to briefly discuss her history of factor XI deficiency. We reviewed that factor XI deficiency is a rare genetic bleeding disorder caused by reduced levels and insufficient activity of a blood protein called factor XI. Factor XI is a protein that is essential for proper clotting in the body. Individuals with factor XI deficiency do not bleed faster or more profusely than healthy individuals; rather, they have difficulty clotting to stop blood flow following a deep/surgical wound. In most patients, prolonged bleeding episodes occur after surgeries, dental procedures, or trauma. The severity of symptoms of factor XI deficiency can vary between affected individuals, even among those in the same family. Severity is also not directly correlated to the amount of residual blood factor XI levels.   Factor XI deficiency is caused by pathogenic variants in the F11 gene. The F11 gene encodes for the factor XI protein. Pathogenic variants in the F11 gene result in deficient levels of functional factor XI, leading to features associated with the condition. Factor XI deficiency is usually inherited in an autosomal recessive fashion, meaning that both parents must be carriers for the condition to have a chance of having an affected child. Carriers for the autosomal recessive form of factor XI deficiency typically do not demonstrate any symptoms of the condition. Given that Ms. Pickup has symptoms of factor XI deficiency, she may be affected by the recessive form of the condition or have the rarer autosomal dominant form of the condition. If she is affected by the dominant form, one pathogenic change in the F11 gene is sufficient to cause symptoms of the condition. In this case, Ms. Ahola could have up to a 1 in 2 (50%) chance of having an affected child.  We discussed the possibility of assessing for factor XI deficiency in the current fetus. Ms. Sjoberg preferred to wait until her son is born to assess  for symptoms of the condition. Notably, she does plan on circumcising her son. I recommended that she notify the healthcare provider that will perform the surgery of her personal history of factor XI deficiency in case any bleeding complications arise following the procedure. Ms. Dalzell confirmed that she had no further questions. I provided her with my contact question should any additional concerns arise.  Buelah Manis, MS, [facility] Genetic Counselor

## 2019-05-15 ENCOUNTER — Encounter: Payer: Self-pay | Admitting: Family Medicine

## 2019-05-15 ENCOUNTER — Telehealth (INDEPENDENT_AMBULATORY_CARE_PROVIDER_SITE_OTHER): Payer: BC Managed Care – PPO | Admitting: Family Medicine

## 2019-05-15 VITALS — BP 108/78 | HR 93

## 2019-05-15 DIAGNOSIS — Z2839 Other underimmunization status: Secondary | ICD-10-CM

## 2019-05-15 DIAGNOSIS — O0990 Supervision of high risk pregnancy, unspecified, unspecified trimester: Secondary | ICD-10-CM

## 2019-05-15 DIAGNOSIS — O99891 Other specified diseases and conditions complicating pregnancy: Secondary | ICD-10-CM

## 2019-05-15 DIAGNOSIS — Z6791 Unspecified blood type, Rh negative: Secondary | ICD-10-CM

## 2019-05-15 DIAGNOSIS — Z832 Family history of diseases of the blood and blood-forming organs and certain disorders involving the immune mechanism: Secondary | ICD-10-CM

## 2019-05-15 DIAGNOSIS — O26899 Other specified pregnancy related conditions, unspecified trimester: Secondary | ICD-10-CM

## 2019-05-15 DIAGNOSIS — Z283 Underimmunization status: Secondary | ICD-10-CM

## 2019-05-15 DIAGNOSIS — D681 Hereditary factor XI deficiency: Secondary | ICD-10-CM

## 2019-05-15 DIAGNOSIS — O09899 Supervision of other high risk pregnancies, unspecified trimester: Secondary | ICD-10-CM | POA: Insufficient documentation

## 2019-05-15 NOTE — Progress Notes (Signed)
I connected with@ on 05/15/19 at 10:15 AM EDT by: MyChart and verified that I am speaking with the correct person using two identifiers.  Patient is located at home and provider is located at [facility].     The purpose of this virtual visit is to provide medical care while limiting exposure to the novel coronavirus. I discussed the limitations, risks, security and privacy concerns of performing an evaluation and management service by MyChart and the availability of in person appointments. I also discussed with the patient that there may be a patient responsible charge related to this service. By engaging in this virtual visit, you consent to the provision of healthcare.  Additionally, you authorize for your insurance to be billed for the services provided during this visit.  The patient expressed understanding and agreed to proceed.  The following staff members participated in the virtual visit:  Clarnce Flock MD/MPH    PRENATAL VISIT NOTE  Subjective:  Alice Castillo is a 24 y.o. [id number] at [redacted]w[redacted]d  for phone visit for ongoing prenatal care.  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  Patient reports intermittent headaches, better with rest/sleep.  Contractions: Not present. Vag. Bleeding: None.  Movement: Present. Denies leaking of fluid.   The following portions of the patient's history were reviewed and updated as appropriate: allergies, current medications, past family history, past medical history, past social history,  past surgical history and problem list.   Objective:   Vitals:   05/15/19 1013  BP: 108/78  Pulse: 93   Self-Obtained  Fetal Status:     Movement: Present     Assessment and Plan:  Pregnancy: G4P1021 at [redacted]w[redacted]d 1. Supervision of high risk pregnancy, antepartum Intermittent headaches, improved with rest. BP's normal throughout pregnancy, reviewed warning signs. Good FM RTC 2 weeks  2. Rh negative state in antepartum period Received rhogam on 04/06/2019 in [facility]  3. Family history of clotting disorder   4. 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  Return in 2 weeks (on 05/29/2019).  No future appointments.   Time spent on virtual visit: 11 minutes  Clarnce Flock, MD

## 2019-05-15 NOTE — Progress Notes (Signed)
I connected with  [name] on 05/15/19 at 10:15 AM EDT by telephone and verified that I am speaking with the correct person using two identifiers.   I discussed the limitations, risks, security and privacy concerns of performing an evaluation and management service by telephone and the availability of in person appointments. I also discussed with the patient that there may be a patient responsible charge related to this service. The patient expressed understanding and agreed to proceed.  Bethanne Ginger, CMA 05/15/2019  10:11 AM

## 2019-05-15 NOTE — Patient Instructions (Signed)
Contraception Choices Contraception, also called birth control, refers to methods or devices that prevent pregnancy. Hormonal methods Contraceptive implant  A contraceptive implant is a thin, plastic tube that contains a hormone. It is inserted into the upper part of the arm. It can remain in place for up to 3 years. Progestin-only injections Progestin-only injections are injections of progestin, a synthetic form of the hormone progesterone. They are given every 3 months by a health care provider. Birth control pills  Birth control pills are pills that contain hormones that prevent pregnancy. They must be taken once a day, preferably at the same time each day. Birth control patch  The birth control patch contains hormones that prevent pregnancy. It is placed on the skin and must be changed once a week for three weeks and removed on the fourth week. A prescription is needed to use this method of contraception. Vaginal ring  A vaginal ring contains hormones that prevent pregnancy. It is placed in the vagina for three weeks and removed on the fourth week. After that, the process is repeated with a new ring. A prescription is needed to use this method of contraception. Emergency contraceptive Emergency contraceptives prevent pregnancy after unprotected sex. They come in pill form and can be taken up to 5 days after sex. They work best the sooner they are taken after having sex. Most emergency contraceptives are available without a prescription. This method should not be used as your only form of birth control. Barrier methods Female condom  A female condom is a thin sheath that is worn over the penis during sex. Condoms keep sperm from going inside a woman's body. They can be used with a spermicide to increase their effectiveness. They should be disposed after a single use. Female condom  A female condom is a soft, loose-fitting sheath that is put into the vagina before sex. The condom keeps  sperm from going inside a woman's body. They should be disposed after a single use. Diaphragm  A diaphragm is a soft, dome-shaped barrier. It is inserted into the vagina before sex, along with a spermicide. The diaphragm blocks sperm from entering the uterus, and the spermicide kills sperm. A diaphragm should be left in the vagina for 6-8 hours after sex and removed within 24 hours. A diaphragm is prescribed and fitted by a health care provider. A diaphragm should be replaced every 1-2 years, after giving birth, after gaining more than 15 lb (6.8 kg), and after pelvic surgery. Cervical cap  A cervical cap is a round, soft latex or plastic cup that fits over the cervix. It is inserted into the vagina before sex, along with spermicide. It blocks sperm from entering the uterus. The cap should be left in place for 6-8 hours after sex and removed within 48 hours. A cervical cap must be prescribed and fitted by a health care provider. It should be replaced every 2 years. Sponge  A sponge is a soft, circular piece of polyurethane foam with spermicide on it. The sponge helps block sperm from entering the uterus, and the spermicide kills sperm. To use it, you make it wet and then insert it into the vagina. It should be inserted before sex, left in for at least 6 hours after sex, and removed and thrown away within 30 hours. Spermicides Spermicides are chemicals that kill or block sperm from entering the cervix and uterus. They can come as a cream, jelly, suppository, foam, or tablet. A spermicide should be inserted into  the vagina with an applicator at least 10-15 minutes before sex to allow time for it to work. The process must be repeated every time you have sex. Spermicides do not require a prescription. Intrauterine contraception Intrauterine device (IUD) An IUD is a T-shaped device that is put in a woman's uterus. There are two types:  Hormone IUD.This type contains progestin, a synthetic form of the  hormone progesterone. This type can stay in place for 3-5 years.  Copper IUD.This type is wrapped in copper wire. It can stay in place for 10 years.  Permanent methods of contraception Female tubal ligation In this method, a woman's fallopian tubes are sealed, tied, or blocked during surgery to prevent eggs from traveling to the uterus. Hysteroscopic sterilization In this method, a small, flexible insert is placed into each fallopian tube. The inserts cause scar tissue to form in the fallopian tubes and block them, so sperm cannot reach an egg. The procedure takes about 3 months to be effective. Another form of birth control must be used during those 3 months. Female sterilization This is a procedure to tie off the tubes that carry sperm (vasectomy). After the procedure, the man can still ejaculate fluid (semen). Natural planning methods Natural family planning In this method, a couple does not have sex on days when the woman could become pregnant. Calendar method This means keeping track of the length of each menstrual cycle, identifying the days when pregnancy can happen, and not having sex on those days. Ovulation method In this method, a couple avoids sex during ovulation. Symptothermal method This method involves not having sex during ovulation. The woman typically checks for ovulation by watching changes in her temperature and in the consistency of cervical mucus. Post-ovulation method In this method, a couple waits to have sex until after ovulation. Summary  Contraception, also called birth control, means methods or devices that prevent pregnancy.  Hormonal methods of contraception include implants, injections, pills, patches, vaginal rings, and emergency contraceptives.  Barrier methods of contraception can include female condoms, female condoms, diaphragms, cervical caps, sponges, and spermicides.  There are two types of IUDs (intrauterine devices). An IUD can be put in a woman's  uterus to prevent pregnancy for 3-5 years.  Permanent sterilization can be done through a procedure for males, females, or both.  Natural family planning methods involve not having sex on days when the woman could become pregnant. This information is not intended to replace advice given to you by your health care provider. Make sure you discuss any questions you have with your health care provider. Document Revised: 02/14/2017 Document Reviewed: 03/17/2016 Elsevier Patient Education  2020 [employer]   Breastfeeding  Choosing to breastfeed is one of the best decisions you can make for yourself and your baby. A change in hormones during pregnancy causes your breasts to make breast milk in your milk-producing glands. Hormones prevent breast milk from being released before your baby is born. They also prompt milk flow after birth. Once breastfeeding has begun, thoughts of your baby, as well as his or her sucking or crying, can stimulate the release of milk from your milk-producing glands. Benefits of breastfeeding Research shows that breastfeeding offers many health benefits for infants and mothers. It also offers a cost-free and convenient way to feed your baby. For your baby  Your first milk (colostrum) helps your baby's digestive system to function better.  Special cells in your milk (antibodies) help your baby to fight off infections.  Breastfed babies are   less likely to develop asthma, allergies, obesity, or type 2 diabetes. They are also at lower risk for sudden infant death syndrome (SIDS).  Nutrients in breast milk are better able to meet your baby's needs compared to infant formula.  Breast milk improves your baby's brain development. For you  Breastfeeding helps to create a very special bond between you and your baby.  Breastfeeding is convenient. Breast milk costs nothing and is always available at the correct temperature.  Breastfeeding helps to burn calories. It helps you  to lose the weight that you gained during pregnancy.  Breastfeeding makes your uterus return faster to its size before pregnancy. It also slows bleeding (lochia) after you give birth.  Breastfeeding helps to lower your risk of developing type 2 diabetes, osteoporosis, rheumatoid arthritis, cardiovascular disease, and breast, ovarian, uterine, and endometrial cancer later in life. Breastfeeding basics Starting breastfeeding  Find a comfortable place to sit or lie down, with your neck and back well-supported.  Place a pillow or a rolled-up blanket under your baby to bring him or her to the level of your breast (if you are seated). Nursing pillows are specially designed to help support your arms and your baby while you breastfeed.  Make sure that your baby's tummy (abdomen) is facing your abdomen.  Gently massage your breast. With your fingertips, massage from the outer edges of your breast inward toward the nipple. This encourages milk flow. If your milk flows slowly, you may need to continue this action during the feeding.  Support your breast with 4 fingers underneath and your thumb above your nipple (make the letter "C" with your hand). Make sure your fingers are well away from your nipple and your baby's mouth.  Stroke your baby's lips gently with your finger or nipple.  When your baby's mouth is open wide enough, quickly bring your baby to your breast, placing your entire nipple and as much of the areola as possible into your baby's mouth. The areola is the colored area around your nipple. ? More areola should be visible above your baby's upper lip than below the lower lip. ? Your baby's lips should be opened and extended outward (flanged) to ensure an adequate, comfortable latch. ? Your baby's tongue should be between his or her lower gum and your breast.  Make sure that your baby's mouth is correctly positioned around your nipple (latched). Your baby's lips should create a seal on your  breast and be turned out (everted).  It is common for your baby to suck about 2-3 minutes in order to start the flow of breast milk. Latching Teaching your baby how to latch onto your breast properly is very important. An improper latch can cause nipple pain, decreased milk supply, and poor weight gain in your baby. Also, if your baby is not latched onto your nipple properly, he or she may swallow some air during feeding. This can make your baby fussy. Burping your baby when you switch breasts during the feeding can help to get rid of the air. However, teaching your baby to latch on properly is still the best way to prevent fussiness from swallowing air while breastfeeding. Signs that your baby has successfully latched onto your nipple  Silent tugging or silent sucking, without causing you pain. Infant's lips should be extended outward (flanged).  Swallowing heard between every 3-4 sucks once your milk has started to flow (after your let-down milk reflex occurs).  Muscle movement above and in front of his or her   ears while sucking. Signs that your baby has not successfully latched onto your nipple  Sucking sounds or smacking sounds from your baby while breastfeeding.  Nipple pain. If you think your baby has not latched on correctly, slip your finger into the corner of your baby's mouth to break the suction and place it between your baby's gums. Attempt to start breastfeeding again. Signs of successful breastfeeding Signs from your baby  Your baby will gradually decrease the number of sucks or will completely stop sucking.  Your baby will fall asleep.  Your baby's body will relax.  Your baby will retain a small amount of milk in his or her mouth.  Your baby will let go of your breast by himself or herself. Signs from you  Breasts that have increased in firmness, weight, and size 1-3 hours after feeding.  Breasts that are softer immediately after breastfeeding.  Increased milk  volume, as well as a change in milk consistency and color by the fifth day of breastfeeding.  Nipples that are not sore, cracked, or bleeding. Signs that your baby is getting enough milk  Wetting at least 1-2 diapers during the first 24 hours after birth.  Wetting at least 5-6 diapers every 24 hours for the first week after birth. The urine should be clear or pale yellow by the age of 5 days.  Wetting 6-8 diapers every 24 hours as your baby continues to grow and develop.  At least 3 stools in a 24-hour period by the age of 5 days. The stool should be soft and yellow.  At least 3 stools in a 24-hour period by the age of 7 days. The stool should be seedy and yellow.  No loss of weight greater than 10% of birth weight during the first 3 days of life.  Average weight gain of 4-7 oz (113-198 g) per week after the age of 4 days.  Consistent daily weight gain by the age of 5 days, without weight loss after the age of 2 weeks. After a feeding, your baby may spit up a small amount of milk. This is normal. Breastfeeding frequency and duration Frequent feeding will help you make more milk and can prevent sore nipples and extremely full breasts (breast engorgement). Breastfeed when you feel the need to reduce the fullness of your breasts or when your baby shows signs of hunger. This is called "breastfeeding on demand." Signs that your baby is hungry include:  Increased alertness, activity, or restlessness.  Movement of the head from side to side.  Opening of the mouth when the corner of the mouth or cheek is stroked (rooting).  Increased sucking sounds, smacking lips, cooing, sighing, or squeaking.  Hand-to-mouth movements and sucking on fingers or hands.  Fussing or crying. Avoid introducing a pacifier to your baby in the first 4-6 weeks after your baby is born. After this time, you may choose to use a pacifier. Research has shown that pacifier use during the first year of a baby's life  decreases the risk of sudden infant death syndrome (SIDS). Allow your baby to feed on each breast as long as he or she wants. When your baby unlatches or falls asleep while feeding from the first breast, offer the second breast. Because newborns are often sleepy in the first few weeks of life, you may need to awaken your baby to get him or her to feed. Breastfeeding times will vary from baby to baby. However, the following rules can serve as a guide to   help you make sure that your baby is properly fed:  Newborns (babies 4 weeks of age or younger) may breastfeed every 1-3 hours.  Newborns should not go without breastfeeding for longer than 3 hours during the day or 5 hours during the night.  You should breastfeed your baby a minimum of 8 times in a 24-hour period. Breast milk pumping     Pumping and storing breast milk allows you to make sure that your baby is exclusively fed your breast milk, even at times when you are unable to breastfeed. This is especially important if you go back to work while you are still breastfeeding, or if you are not able to be present during feedings. Your lactation consultant can help you find a method of pumping that works best for you and give you guidelines about how long it is safe to store breast milk. Caring for your breasts while you breastfeed Nipples can become dry, cracked, and sore while breastfeeding. The following recommendations can help keep your breasts moisturized and healthy:  Avoid using soap on your nipples.  Wear a supportive bra designed especially for nursing. Avoid wearing underwire-style bras or extremely tight bras (sports bras).  Air-dry your nipples for 3-4 minutes after each feeding.  Use only cotton bra pads to absorb leaked breast milk. Leaking of breast milk between feedings is normal.  Use lanolin on your nipples after breastfeeding. Lanolin helps to maintain your skin's normal moisture barrier. Pure lanolin is not harmful (not  toxic) to your baby. You may also hand express a few drops of breast milk and gently massage that milk into your nipples and allow the milk to air-dry. In the first few weeks after giving birth, some women experience breast engorgement. Engorgement can make your breasts feel heavy, warm, and tender to the touch. Engorgement peaks within 3-5 days after you give birth. The following recommendations can help to ease engorgement:  Completely empty your breasts while breastfeeding or pumping. You may want to start by applying warm, moist heat (in the shower or with warm, water-soaked hand towels) just before feeding or pumping. This increases circulation and helps the milk flow. If your baby does not completely empty your breasts while breastfeeding, pump any extra milk after he or she is finished.  Apply ice packs to your breasts immediately after breastfeeding or pumping, unless this is too uncomfortable for you. To do this: ? Put ice in a plastic bag. ? Place a towel between your skin and the bag. ? Leave the ice on for 20 minutes, 2-3 times a day.  Make sure that your baby is latched on and positioned properly while breastfeeding. If engorgement persists after 48 hours of following these recommendations, contact your health care provider or a lactation consultant. Overall health care recommendations while breastfeeding  Eat 3 healthy meals and 3 snacks every day. Well-nourished mothers who are breastfeeding need an additional 450-500 calories a day. You can meet this requirement by increasing the amount of a balanced diet that you eat.  Drink enough water to keep your urine pale yellow or clear.  Rest often, relax, and continue to take your prenatal vitamins to prevent fatigue, stress, and low vitamin and mineral levels in your body (nutrient deficiencies).  Do not use any products that contain nicotine or tobacco, such as cigarettes and e-cigarettes. Your baby may be harmed by chemicals from  cigarettes that pass into breast milk and exposure to secondhand smoke. If you need help quitting, ask your   health care provider.  Avoid alcohol.  Do not use illegal drugs or marijuana.  Talk with your health care provider before taking any medicines. These include over-the-counter and prescription medicines as well as vitamins and herbal supplements. Some medicines that may be harmful to your baby can pass through breast milk.  It is possible to become pregnant while breastfeeding. If birth control is desired, ask your health care provider about options that will be safe while breastfeeding your baby. Where to find more information: [employer]: www.llli.org Contact a health care provider if:  You feel like you want to stop breastfeeding or have become frustrated with breastfeeding.  Your nipples are cracked or bleeding.  Your breasts are red, tender, or warm.  You have: ? Painful breasts or nipples. ? A swollen area on either breast. ? A fever or chills. ? Nausea or vomiting. ? Drainage other than breast milk from your nipples.  Your breasts do not become full before feedings by the fifth day after you give birth.  You feel sad and depressed.  Your baby is: ? Too sleepy to eat well. ? Having trouble sleeping. ? More than 1 week old and wetting fewer than 6 diapers in a 24-hour period. ? Not gaining weight by 5 days of age.  Your baby has fewer than 3 stools in a 24-hour period.  Your baby's skin or the white parts of his or her eyes become yellow. Get help right away if:  Your baby is overly tired (lethargic) and does not want to wake up and feed.  Your baby develops an unexplained fever. Summary  Breastfeeding offers many health benefits for infant and mothers.  Try to breastfeed your infant when he or she shows early signs of hunger.  Gently tickle or stroke your baby's lips with your finger or nipple to allow the baby to open his or her mouth.  Bring the baby to your breast. Make sure that much of the areola is in your baby's mouth. Offer one side and burp the baby before you offer the other side.  Talk with your health care provider or lactation consultant if you have questions or you face problems as you breastfeed. This information is not intended to replace advice given to you by your health care provider. Make sure you discuss any questions you have with your health care provider. Document Revised: 05/09/2017 Document Reviewed: 03/16/2016 Elsevier Patient Education  2020 Elsevier Inc.  

## 2019-06-01 ENCOUNTER — Encounter: Payer: Self-pay | Admitting: Obstetrics and Gynecology

## 2019-06-01 ENCOUNTER — Other Ambulatory Visit: Payer: Self-pay

## 2019-06-01 ENCOUNTER — Telehealth (INDEPENDENT_AMBULATORY_CARE_PROVIDER_SITE_OTHER): Payer: BC Managed Care – PPO | Admitting: Obstetrics and Gynecology

## 2019-06-01 VITALS — BP 108/73 | HR 87 | Wt 158.0 lb

## 2019-06-01 DIAGNOSIS — O360130 Maternal care for anti-D [Rh] antibodies, third trimester, not applicable or unspecified: Secondary | ICD-10-CM

## 2019-06-01 DIAGNOSIS — D681 Hereditary factor XI deficiency: Secondary | ICD-10-CM

## 2019-06-01 DIAGNOSIS — O09899 Supervision of other high risk pregnancies, unspecified trimester: Secondary | ICD-10-CM

## 2019-06-01 DIAGNOSIS — O0990 Supervision of high risk pregnancy, unspecified, unspecified trimester: Secondary | ICD-10-CM

## 2019-06-01 DIAGNOSIS — Z283 Underimmunization status: Secondary | ICD-10-CM

## 2019-06-01 DIAGNOSIS — Z3A35 35 weeks gestation of pregnancy: Secondary | ICD-10-CM

## 2019-06-01 DIAGNOSIS — O99891 Other specified diseases and conditions complicating pregnancy: Secondary | ICD-10-CM

## 2019-06-01 DIAGNOSIS — Z6791 Unspecified blood type, Rh negative: Secondary | ICD-10-CM

## 2019-06-01 NOTE — Progress Notes (Signed)
   OBSTETRICS PRENATAL VIRTUAL VISIT ENCOUNTER NOTE  Provider location: [facility] [place]   I connected with [name] on 06/01/19 at  3:15 PM EDT by MyChart Video Encounter at home and verified that I am speaking with the correct person using two identifiers.   I discussed the limitations, risks, security and privacy concerns of performing an evaluation and management service virtually and the availability of in person appointments. I also discussed with the patient that there may be a patient responsible charge related to this service. The patient expressed understanding and agreed to proceed. Subjective:  Alice Castillo is a 24 y.o. [id number] at [redacted]w[redacted]d being seen today for ongoing prenatal care.  She is currently monitored for the following issues for this high-risk pregnancy and has Major depressive disorder, recurrent, severe with psychotic features (Alice Castillo); Hallucinations; PTSD (post-traumatic stress disorder); Anxiety during pregnancy, antepartum; Rh negative state in antepartum period; Family history of clotting disorder; Paresthesias in antepartum period in third trimester; Family history of genetic disorder; Factor XI deficiency (Alice Castillo); Supervision of high risk pregnancy, antepartum; and Rubella non-immune status, antepartum on their problem list.  Patient reports no complaints.  Contractions: Not present. Vag. Bleeding: None.  Movement: Present. Denies any leaking of fluid.   The following portions of the patient's history were reviewed and updated as appropriate: allergies, current medications, past family history, past medical history, past social history, past surgical history and problem list.   Objective:   Vitals:   06/01/19 1607  BP: 108/73  Pulse: 87  Weight: 158 lb (71.7 kg)    Fetal Status:     Movement: Present     General:  Alert, oriented and cooperative. Patient is in no acute distress.  Respiratory: Normal respiratory effort, no problems with  respiration noted  Mental Status: Normal mood and affect. Normal behavior. Normal judgment and thought content.  Rest of physical exam deferred due to type of encounter  Imaging: No results found.  Assessment and Plan:  Pregnancy: [id number] at [redacted]w[redacted]d 1. Supervision of high risk pregnancy, antepartum Stable GBS next visit  2. Factor XI deficiency ([place]) Delivery plans/management documented in 04/13/19 OB visit  3. Rh negative state in antepartum period S/P Rhogam  4. Rubella non-immune status, antepartum Vaccine PP  Preterm labor symptoms and general obstetric precautions including but not limited to vaginal bleeding, contractions, leaking of fluid and fetal movement were reviewed in detail with the patient. I discussed the assessment and treatment plan with the patient. The patient was provided an opportunity to ask questions and all were answered. The patient agreed with the plan and demonstrated an understanding of the instructions. The patient was advised to call back or seek an in-person office evaluation/go to [facility] at [facility] for any urgent or concerning symptoms. Please refer to After Visit Summary for other counseling recommendations.   I provided 8 minutes of face-to-face time during this encounter.  Return in about 1 week (around 06/08/2019) for OB visit, face to face for GBS, MD provider.  No future appointments.  Chancy Milroy, MD [facility] [place] [place]

## 2019-06-10 ENCOUNTER — Ambulatory Visit (INDEPENDENT_AMBULATORY_CARE_PROVIDER_SITE_OTHER): Payer: BC Managed Care – PPO | Admitting: Clinical

## 2019-06-10 ENCOUNTER — Ambulatory Visit (INDEPENDENT_AMBULATORY_CARE_PROVIDER_SITE_OTHER): Payer: BC Managed Care – PPO | Admitting: Family Medicine

## 2019-06-10 ENCOUNTER — Other Ambulatory Visit (HOSPITAL_COMMUNITY)
Admission: RE | Admit: 2019-06-10 | Discharge: 2019-06-10 | Disposition: A | Discharge status: Home / Self Care | Payer: BC Managed Care – PPO | Source: Ambulatory Visit | Attending: Family Medicine | Admitting: Family Medicine

## 2019-06-10 ENCOUNTER — Other Ambulatory Visit: Payer: Self-pay

## 2019-06-10 VITALS — BP 111/69 | HR 93 | Wt 158.8 lb

## 2019-06-10 DIAGNOSIS — Z6791 Unspecified blood type, Rh negative: Secondary | ICD-10-CM

## 2019-06-10 DIAGNOSIS — Z283 Underimmunization status: Secondary | ICD-10-CM

## 2019-06-10 DIAGNOSIS — O0990 Supervision of high risk pregnancy, unspecified, unspecified trimester: Secondary | ICD-10-CM

## 2019-06-10 DIAGNOSIS — O0973 Supervision of high risk pregnancy due to social problems, third trimester: Secondary | ICD-10-CM

## 2019-06-10 DIAGNOSIS — O99113 Other diseases of the blood and blood-forming organs and certain disorders involving the immune mechanism complicating pregnancy, third trimester: Secondary | ICD-10-CM

## 2019-06-10 DIAGNOSIS — O99343 Other mental disorders complicating pregnancy, third trimester: Secondary | ICD-10-CM

## 2019-06-10 DIAGNOSIS — Z1331 Encounter for screening for depression: Secondary | ICD-10-CM

## 2019-06-10 DIAGNOSIS — Z3A36 36 weeks gestation of pregnancy: Secondary | ICD-10-CM

## 2019-06-10 DIAGNOSIS — D681 Hereditary factor XI deficiency: Secondary | ICD-10-CM

## 2019-06-10 DIAGNOSIS — Z2839 Other underimmunization status: Secondary | ICD-10-CM

## 2019-06-10 DIAGNOSIS — F333 Major depressive disorder, recurrent, severe with psychotic symptoms: Secondary | ICD-10-CM

## 2019-06-10 NOTE — BH Specialist Note (Signed)
Integrated Behavioral Health Initial Visit  MRN: [id number] Name: Alice Castillo  Number of [facility] Clinician visits:: 1/6 Session Start time: 3:00  Session End time: 3:05 Total time:  5  Type of Service: [place] Interpretor:No. Interpretor Name and Language: n/a   Warm Hand Off Completed.       SUBJECTIVE: Alice Castillo is a 24 y.o. female accompanied by n/a Patient was referred by Darron Doom, MD for positive depression screen. Patient reports the following symptoms/concerns: Pt states history of depression prior to pregnancy, prefers no medication today, and agrees to virtual visit for follow up.  Duration of problem: Ongoing; Severity of problem: severe  OBJECTIVE: Mood: Normal and Affect: Appropriate Risk of harm to self or others: No plan to harm self or others  LIFE CONTEXT: Family and Social: - School/Work: - Self-Care: - Life Changes: Current pregnancy  GOALS ADDRESSED: Patient will: 1. Reduce symptoms of: anxiety and depression 2. Increase knowledge and/or ability of: healthy habits  3. Demonstrate ability to: Increase healthy adjustment to current life circumstances  INTERVENTIONS: Interventions utilized: Supportive Counseling and Psychoeducation and/or Health Education  Standardized Assessments completed: GAD-7 and PHQ 9  ASSESSMENT: Patient currently experiencing Diagnosis deferred.   Patient may benefit from psychoeducation and brief therapeutic interventions regarding coping with symptoms of depression and anxiety .  PLAN: 1. Follow up with behavioral health clinician on : One week 2. Behavioral recommendations:  -Call [name] at [phone] if need to reschedule or have questions prior to scheduled visit -Read through Postpartum Planner, and use as needed -Continue taking prenatal vitamin daily 3. Referral(s): [place] (In Clinic) 4. "From scale of 1-10, how  likely are you to follow plan?": 10  Garlan Fair, LCSW  Depression screen [facility] 2/9 06/10/2019 04/29/2019 04/13/2019  Decreased Interest 2 2 2   Down, Depressed, Hopeless 2 3 2   PHQ - 2 Score 4 5 4   Altered sleeping 3 3 3   Tired, decreased energy 3 3 3   Change in appetite 3 2 3   Feeling bad or failure about yourself  2 0 0  Trouble concentrating 2 1 1   Moving slowly or fidgety/restless 0 1 0  Suicidal thoughts 0 0 0  PHQ-9 Score 17 15 14    GAD 7 : Generalized Anxiety Score 06/10/2019 04/29/2019 04/13/2019  Nervous, Anxious, on Edge 3 2 2   Control/stop worrying 3 1 1   Worry too much - different things 3 0 2  Trouble relaxing 3 3 3   Restless 3 3 2   Easily annoyed or irritable 3 3 3   Afraid - awful might happen 3 2 2   Total GAD 7 Score 21 14 15

## 2019-06-10 NOTE — Patient Instructions (Addendum)
If you are in need of transportation to get to and from your appointments in our office.  You can reach Transportation Services by calling [phone] Monday - Friday  7am-6pm.  PEDIATRIC/FAMILY [place] [phone]) . [facility] Davy Pique, MD; Gwendlyn Deutscher, MD; Walker Kehr, MD; Andria Frames, MD; McDiarmid, MD; Dutch Quint, MD; Nori Riis, MD; Mingo Amber, [address]., [place], [place] [address] o [phone] o Mon-Fri 8:30-12:30, 1:30-5:00 o Providers come to see babies at [facility] o Accepting Medicaid . [name] [facility] [place] providers who accept newborns: Dorthy Cooler, MD; Orland Mustard, MD; Stephanie Acre, MD o [place], [place], [place] [address] o [phone] o Mon-Fri 8:00-5:30 o Babies seen by providers at [facility] o Does NOT accept Medicaid o Please call early in hospitalization for appointment (limited availability)  . Mustard [place], MD o [address], [place], [place] [address] o [phone] o Mon, Tue, Thur, Fri 8:30-5:00, Wed 10:00-7:00 (closed 1-2pm) o Babies seen by [facility] providers o Accepting Medicaid . [place], MD o [place], [place], [place] [address] o [phone] o Mon-Fri 8:30-5:00, Sat 8:30-12:00 o Provider comes to see babies at [place] Medicaid o Must have been referred from current patients or contacted office prior to delivery . [facility] ([place] for [place]) Franne Forts, MD; Tamera Punt, MD; Doneen Poisson, MD; Fatima Sanger, MD; Wynetta Emery, MD; Jess Barters, MD; Tami Ribas, MD; Herbert Moors, MD; Derrell Lolling, MD; Dorothyann Peng, MD; Lucious Groves, NP; Baldo Ash, NP o [address], [place], [place] [address] o [phone] o Mon, Tue, Thur, Fri 8:30-5:30, Wed 9:30-5:30, Sat 8:30-12:30 o Babies seen by [facility] providers o Accepting Medicaid o Only  accepting infants of first-time parents or siblings of current patients [facility] discharge coordinator will make follow-up appointment . Baltazar Najjar o [place] [address] [place], [place]  [address] o [phone]   Fax - [phone] . [facility] o [id number] N. [address], [place], [place]  [address] o Phone - [phone]   Fax [phone] . [place], [place], [place], [place]  [address] o [phone]  East/[place]ortheast [place] [phone]) . [facility] Reginal Lutes, MD; Jacklynn Ganong, MD; Torrie Mayers, MD; MD; Rosana Hoes, MD; Servando Salina, MD; Rose Fillers, MD; Rex Kras, MD; Corinna Capra, MD; Volney American, MD; Trilby Drummer, MD; Janann Colonel, MD; Jimmye Norman, [place], [place], [place] [address] o [phone] o Mon-Fri 8:30-5:00 (extended evenings Mon-Thur as needed), Sat-Sun 10:00-1:00 o Providers come to see babies at [facility] Medicaid for families of first-time babies and families with all children in the household age 74 and under. Must register with office prior to making appointment (M-F only). . [place], NP; Tomi Bamberger, MD; Redmond School, MD; [place], [place]., [place], [place] [address] o [phone] o Mon-Fri 8:00-5:00 o Babies seen by providers at [facility] o Does NOT accept Medicaid/Commercial Insurance Only . [facility] [place] ([facility])  Marnee Guarneri, MD; Drema Dallas, MD; Montine Circle, MD; Vilma Prader, MD; Vanita Panda, MD; Alfonso Ramus, MD; Ruthann Cancer, MD; Roxanne Mins, MD; Rosalva Ferron, MD; Polly Cobia, MD o [address]., [place], [place] [address] o [phone] o Mon-Fri 8:30-5:30, Sat (Oct.-Mar.) 9:00-1:00 o Babies seen by providers at [place] [phone]) . [facility] Peers, MD; Suzan Slick, [place]  [address], [place], [place] [address] o [phone] o Mon-Fri 8:30-5:00, Sat 8:30-12:00 o Providers come to see babies at  [facility] o Does NOT accept Medicaid . [place] at [place], [place]; [place], MD; [place], [place]; Nancy Fetter, MD; Moreen Fowler, [place], [place], [place] [address] o [phone] o Mon-Fri 8:00-5:00 o Babies seen by providers at [facility] o Does NOT accept Medicaid o Only accepting babies of parents who are patients o Please call early in hospitalization for appointment (limited availability) . [facility] Pediatric[facility] Blanca Friend, MD; Sharlene Motts, MD; Rod Can, MD; Warner Mccreedy, NP; Sabra Heck, MD; Ermalinda Memos, MD; Sharlett Iles, NP; Aurther Loft, MD; Jerrye Beavers, MD; Marcello Moores, MD; Berline Lopes, MD; Charolette Forward, MD o [place]. [place], [place], [place] [address] o [phone] o Mon-Fri 8:00-5:00, Sat 9:00-12:00 o Providers come to see babies at [facility] o Does NOT accept [facility] [phone]) . [place] [facility] [place] providers accepting new patients: Dayna Ramus, NP; [place], [place], [place], [place] [address] o [phone] o Mon-Fri 8:00-5:00 o Babies seen by providers at [facility] o Does NOT accept Medicaid o Only accepting babies of parents who are patients o Please call early in hospitalization for appointment (limited availability) . [facility] Oswaldo Conroy, MD; Sheran Lawless, MD o [place]., [place], [place] [address] o [phone] (press 1 to schedule appointment) o Mon-Fri 8:00-5:00 o Providers come to see babies at [facility] o Does NOT accept Medicaid . [facility] Jodi Mourning, MD o [address], [place], [place] [address] o [phone] o Mon-Fri 8:30-5:00 (lunch 12:30-1:00), extended hours by appointment only Wed 5:00-6:30 o Babies seen by [facility] providers o Accepting Medicaid . [place] at Evalyn Casco, MD; [place], MD; Ethlyn Gallery, MD o [place], [place], [place] [address] o [phone] o Mon-Fri 8:00-5:00 o Babies seen by [facility]  providers o Does NOT accept Medicaid . Therapist, music at [place], MD; Yong Channel, MD; [place], [place]., [place], [place] [address] o [phone] o Mon-Fri 8:00-5:00 o Babies seen by [facility] providers o Does NOT accept Medicaid . [place], [place]; [place], [place]; [place], NP; Albertina Parr, MD; Frederic Jericho, MD; Ronney Lion, MD; Carlos Levering, NP; Jerelene Redden, NP; Tomasita Crumble, NP; Ronelle Nigh, NP; Corinna Lines, MD; [place], MD o [place]., [place], [place] [address] o [phone] o Mon-Fri 8:30-5:00, Sat 10:00-1:00 o Providers come to see babies at [facility] o Does NOT accept Medicaid o Free prenatal information session Tuesdays at 4:45pm . [facility] Porfirio Oar, MD; [place], [place]; [place], [place]; [place], [place]., [place] [address] o [phone] o Mon-Fri 7:30-5:30 o Babies seen by [facility] providers . [facility] Children's Doctor o [address] [place], [place], [place]  [address] o [phone]   Fax - [phone]  [place] [phone] & [phone]) [place] [address] [place], [place] [address] o [phone] o Mon-Thur 8:00-6:00 o Providers come to see babies at [place] Medicaid . [place], NP; Melford Aase, MD; [place], [place]; [place], [place]., [place], [place] [address] o [phone] o Mon-Thur 7:30-7:30, Fri 7:30-4:30 o Babies seen by [facility] providers o Accepting Medicaid . [facility] Nyra Jabs, MD; Cristino Martes, NP; Gertie Baron, MD o [place] [address], [place],  [address] o [phone] o Mon-Fri 8:30-5:00, Sat 8:30-12:00 o Providers come to see  babies at [place] Medicaid o Must have "Meet & Greet" appointment at office prior to delivery . [place] ([place]) Jodene Nam, MD; Juleen China, MD;  Clydene Laming, [address], [place], [place] [address] o [phone] o Mon-Wed 8:00-6:00, Thur-Fri 8:00-5:00, Sat 9:00-12:00 o Providers come to see babies at [facility] o Does NOT accept Medicaid o Only accepting siblings of current patients . [facility] [place], [place], [place], [place]  [address] o [phone]   Fax [phone] . [facility] [place] [address]. [address] [place], [place]  [address] o [phone]   Fax - [place] [phone] & [phone]) . Therapist, music at [place], DO; [place], [address]., [place], [place] [address] o [phone] o Mon-Fri 7:00-5:00 o Babies seen by [facility] providers o Does NOT accept Medicaid . [place], MD; [place], [place]; [place], [place], [place], [place] [address] o [phone] o Mon-Fri 8:00-5:00 o Babies seen by [facility] providers o Accepting Medicaid . [place], MD; [place], [place]; [place], NP; [place], [place], [place], [place] [address] o [phone] o Mon-Fri 8:00-5:00 o Babies seen by providers at [place] [phone]) . [facility] at [place], [place] o [place]., [place], [place] [address] o [phone] o Mon-Fri 8:00-5:00 o Babies seen by [facility] providers o Does NOT accept Medicaid o Limited availability, please call early in hospitalization to schedule follow-up . [facility] Leilani Merl, PA; Maisie Fus, MD; [place], MD; [place], [place]; Jeannine Kitten, MD; [place], [facility] [address], [place], [place] [address] o [phone] o Mon-Fri 8:30-5:00, Sat 9:00-12:00 o Babies seen by providers at [facility] o Accepting Medicaid o Please  register online then schedule online or call office o [email or web address] . [place] ([place] at [place]) Kristian Covey, NP; Dwyane Dee, MD; Leonidas Romberg, PA o [address] [place], [place], [place] [address] o [phone] o Mon-Fri 8:00-5:00 o Babies seen by providers at [facility] o Accepting Medicaid . [place] ([facility] [employer]) Dairl Ponder, MD; Rayvon Char, NP; Melina Modena, MD o [address] [place], [place], [place] [address] o [phone] o Mon-Fri 8:00-5:30, Sat&Sun by appointment (phones open at 8:30) o Babies seen by [facility] providers o Accepting Medicaid o Must be a first-time baby or sibling of current patient . [place], [id number], [place], [place]  [address] o [phone]   Fax - [phone]  [place] [phone] & [phone]) . [place], [place]; [place], [place]; Benjamine Mola, MD; [place], [place]; Harrell Lark, MD o [address], [place], [place] [address] o [phone] o Mon-Thur 8:00-7:00, Fri 8:00-5:00, Sat 8:00-12:00, Sun 9:00-12:00 o Babies seen by [facility] providers o Accepting Medicaid . [facility], MD; Ruthann Cancer, MD; [facility], MD o [address], [place], [place] [address] o [phone] o Mon-Thur 8:00-5:00 o Babies seen by providers at [facility] o Accepting Medicaid . [facility] at [place], MD; Co[place],  MD; Amedeo Plenty, MD; Bobby Rumpf, MD; List, MD; Lavonia Drafts, MD; Ruthann Cancer, MD; Selinda Eon, MD; Audie Box, MD; Jim Like, MD; Christie Nottingham, MD; Hubbard Hartshorn, MD; [place]ena Nunnery, MD o [place]., [place], [place] [address] o [phone] o Mon-Fri 8:00-5:30, Sat (Oct.-Mar.) 9:00-1:00 o Babies seen by providers at [facility] o Accepting Medicaid o Must fill out new patient packet, available online at  [email or web address] . [place] ([facility]) Barnabas Lister, NP; Kenton Kingfisher, NP; Claiborne Billings, NP; Rolla Plate, MD; [place], [place]; Carola Rhine, MD; Tyron Russell, MD; Delia Chimes, NP o [address], [place], [place] [address] o [phone] o Mon-Thur 8:00-5:30, Fri 8:00-5:00 o Babies seen by providers at [place] [phone]) . [place], [place]; [place], MD; Dennard Schaumann, MD; [place], [place] o [address] [place], [place] [address] o [phone] o Mon-Fri 8:00-5:00 o Babies seen by providers at [place] [phone]) . [place] at [place], [place]; Olen Pel, MD; [place], [place], [place], [place] [address] o [phone] o Mon-Fri 8:00-5:00 o Babies seen by providers at [facility] o Does NOT accept Medicaid o Limited appointment availability, please call early in hospitalization  . Therapist, music at [place], [place]; [place], [address] [place], [place] [address] o [phone] o Mon-Fri 8:00-5:00 o Babies seen by [facility] providers o Does NOT accept Medicaid . [facility] Su Grand, MD; Guy Sandifer, MD; [place], [place]; [place], [place] [address], [place], [place] [address] o [phone] o Mon-Fri 8:00-5:00 o After hours clinic [facility] [place], [place] [address]) [phone] Mon-Fri 5:00-8:00, Sat 12:00-6:00, Sun 10:00-4:00 o Babies seen by [facility] providers o Accepting Medicaid . [place] [facility] o [address], [place], [place]  [address] o [phone]   Fax - [phone]  [place] [phone]) . Therapist, music at [facility], MD o [address] [place], [place], [place] [address] o [phone] o Mon-Fri 8:00-5:00 o Babies seen by [facility] providers o Does NOT accept  Medicaid . [place] ([place] [facility] [place]) Bing Neighbors, MD o [address] [place], [place], [place] [address] o [phone] o Mon-Thur 8:00-7:00, Fri 8:00-5:00, Sat 8:00-12:00 o Babies seen by providers at [facility] o Accepting Medicaid - but does not have vaccinations in office (must be received elsewhere) o Limited availability, please call early in hospitalization  [place] ([address]) . [place], MD o [address] [place] [address] o [phone]  Fax [phone]   [place] Resources  Department of [place] [address], [place], [place] [address] [phone]   or  www.http://james-garner.info/ **SNAP/EBT/ Other nutritional benefits  [facility] [address], [place], [place] [address] [phone]  or  https://palmer-smith.com/ **WIC for  women who are pregnant and postpartum, infants and children up to 37 years old  [place] [address], [place], [place] [address] [phone]   or   www.theblessedtable.org  **Food pantry  [employer] [place], [place], [place] [address] [phone]   or   https://brotherkolbes.godaddysites.com  **Emergency food and prepared meals  [place] [address] [place],  [address] [phone]   or   www.cedargrovetop.us **Food  pantry  [place] [employer] [address], [place], [place] [address] [phone]   or   www.https://hartman-jones.net/ **Food pantry  [employer] [address] [place], [place] [address] [phone] **Food pantry  [facility] [address], [place], [place]  [address] [phone]   or   www.greensborourbanministry.org  Insurance underwriter and prepared meals  [facility] [address] [place], [place], [place], [place] [address] DomainerFinder.be  **Food pantry  [employer] [address], [place], [place] [address] [phone]   or   www.lbcnow.org  **Food pantry  [facility] [address] [place], [place] [address] [phone]   or   http://patterson-parker.net/ **Food pantry, nutrition education, gardening activities  [place] [address], [place], [place] [address] [phone] **Food pantry  [facility] [address], [place], [place] [address] [phone]   or   www.salvationarmyofgreensboro.Lovette Cliche of [place], [place], [place] [address] [phone]   or   http://senior-resources-guilford.org [facility] [place] [address], [place], [place] [address] [phone]   or   www.stmattchurch.com  **Food pantry  [place] [address], [place], [place] [address] [phone]   or   vandaliapresbyterianchurch.org **Food pantry  Breastfeeding  Choosing to breastfeed is one of the best decisions you can make for yourself and your baby. A change in hormones during pregnancy causes your breasts to make breast milk in your milk-producing glands. Hormones prevent breast milk from being released before your baby is born. They also prompt milk flow after birth. Once breastfeeding has begun, thoughts of your baby, as well as his or her sucking or crying, can stimulate the release of milk from your milk-producing glands. Benefits of breastfeeding Research shows that breastfeeding offers many health benefits for infants and mothers. It also offers a cost-free and convenient way to feed your  baby. For your baby  Your first milk (colostrum) helps your baby's digestive system to function better.  Special cells in your milk (antibodies) help your baby to fight off infections.  Breastfed babies are less likely to develop asthma, allergies, obesity, or type 2 diabetes. They are also at lower risk for sudden infant death syndrome (SIDS).  Nutrients in breast milk are better able to meet your baby's needs compared to infant formula.  Breast milk improves your baby's brain development. For you  Breastfeeding helps to create a very special bond between you and your baby.  Breastfeeding is convenient. Breast milk costs nothing and is always available at the correct temperature.  Breastfeeding helps to burn calories. It helps you to lose the weight that you gained during pregnancy.  Breastfeeding makes your uterus return faster to its size before pregnancy. It also slows bleeding (lochia) after you give birth.  Breastfeeding helps to lower your risk of developing type 2 diabetes, osteoporosis, rheumatoid arthritis, cardiovascular disease, and breast, ovarian, uterine, and endometrial cancer later in life. Breastfeeding basics Starting breastfeeding  Find a comfortable place to sit or lie down, with your neck and back well-supported.  Place a pillow or a rolled-up blanket under your baby to bring him or her to the level of your breast (if you are seated). Nursing pillows are specially designed to help support your arms and your baby while you breastfeed.  Make sure that your baby's tummy (abdomen) is facing  your abdomen.  Gently massage your breast. With your fingertips, massage from the outer edges of your breast inward toward the nipple. This encourages milk flow. If your milk flows slowly, you may need to continue this action during the feeding.  Support your breast with 4 fingers underneath and your thumb above your nipple (make the letter "C" with your hand). Make sure your  fingers are well away from your nipple and your baby's mouth.  Stroke your baby's lips gently with your finger or nipple.  When your baby's mouth is open wide enough, quickly bring your baby to your breast, placing your entire nipple and as much of the areola as possible into your baby's mouth. The areola is the colored area around your nipple. ? More areola should be visible above your baby's upper lip than below the lower lip. ? Your baby's lips should be opened and extended outward (flanged) to ensure an adequate, comfortable latch. ? Your baby's tongue should be between his or her lower gum and your breast.  Make sure that your baby's mouth is correctly positioned around your nipple (latched). Your baby's lips should create a seal on your breast and be turned out (everted).  It is common for your baby to suck about 2-3 minutes in order to start the flow of breast milk. Latching Teaching your baby how to latch onto your breast properly is very important. An improper latch can cause nipple pain, decreased milk supply, and poor weight gain in your baby. Also, if your baby is not latched onto your nipple properly, he or she may swallow some air during feeding. This can make your baby fussy. Burping your baby when you switch breasts during the feeding can help to get rid of the air. However, teaching your baby to latch on properly is still the best way to prevent fussiness from swallowing air while breastfeeding. Signs that your baby has successfully latched onto your nipple  Silent tugging or silent sucking, without causing you pain. Infant's lips should be extended outward (flanged).  Swallowing heard between every 3-4 sucks once your milk has started to flow (after your let-down milk reflex occurs).  Muscle movement above and in front of his or her ears while sucking. Signs that your baby has not successfully latched onto your nipple  Sucking sounds or smacking sounds from your baby while  breastfeeding.  Nipple pain. If you think your baby has not latched on correctly, slip your finger into the corner of your baby's mouth to break the suction and place it between your baby's gums. Attempt to start breastfeeding again. Signs of successful breastfeeding Signs from your baby  Your baby will gradually decrease the number of sucks or will completely stop sucking.  Your baby will fall asleep.  Your baby's body will relax.  Your baby will retain a small amount of milk in his or her mouth.  Your baby will let go of your breast by himself or herself. Signs from you  Breasts that have increased in firmness, weight, and size 1-3 hours after feeding.  Breasts that are softer immediately after breastfeeding.  Increased milk volume, as well as a change in milk consistency and color by the fifth day of breastfeeding.  Nipples that are not sore, cracked, or bleeding. Signs that your baby is getting enough milk  Wetting at least 1-2 diapers during the first 24 hours after birth.  Wetting at least 5-6 diapers every 24 hours for the first week after birth. The  urine should be clear or pale yellow by the age of 5 days.  Wetting 6-8 diapers every 24 hours as your baby continues to grow and develop.  At least 3 stools in a 24-hour period by the age of 5 days. The stool should be soft and yellow.  At least 3 stools in a 24-hour period by the age of 7 days. The stool should be seedy and yellow.  No loss of weight greater than 10% of birth weight during the first 3 days of life.  Average weight gain of 4-7 oz (113-198 g) per week after the age of 4 days.  Consistent daily weight gain by the age of 5 days, without weight loss after the age of 2 weeks. After a feeding, your baby may spit up a small amount of milk. This is normal. Breastfeeding frequency and duration Frequent feeding will help you make more milk and can prevent sore nipples and extremely full breasts (breast  engorgement). Breastfeed when you feel the need to reduce the fullness of your breasts or when your baby shows signs of hunger. This is called "breastfeeding on demand." Signs that your baby is hungry include:  Increased alertness, activity, or restlessness.  Movement of the head from side to side.  Opening of the mouth when the corner of the mouth or cheek is stroked (rooting).  Increased sucking sounds, smacking lips, cooing, sighing, or squeaking.  Hand-to-mouth movements and sucking on fingers or hands.  Fussing or crying. Avoid introducing a pacifier to your baby in the first 4-6 weeks after your baby is born. After this time, you may choose to use a pacifier. Research has shown that pacifier use during the first year of a baby's life decreases the risk of sudden infant death syndrome (SIDS). Allow your baby to feed on each breast as long as he or she wants. When your baby unlatches or falls asleep while feeding from the first breast, offer the second breast. Because newborns are often sleepy in the first few weeks of life, you may need to awaken your baby to get him or her to feed. Breastfeeding times will vary from baby to baby. However, the following rules can serve as a guide to help you make sure that your baby is properly fed:  Newborns (babies 52 weeks of age or younger) may breastfeed every 1-3 hours.  Newborns should not go without breastfeeding for longer than 3 hours during the day or 5 hours during the night.  You should breastfeed your baby a minimum of 8 times in a 24-hour period. Breast milk pumping     Pumping and storing breast milk allows you to make sure that your baby is exclusively fed your breast milk, even at times when you are unable to breastfeed. This is especially important if you go back to work while you are still breastfeeding, or if you are not able to be present during feedings. Your lactation consultant can help you find a method of pumping that works  best for you and give you guidelines about how long it is safe to store breast milk. Caring for your breasts while you breastfeed Nipples can become dry, cracked, and sore while breastfeeding. The following recommendations can help keep your breasts moisturized and healthy:  Avoid using soap on your nipples.  Wear a supportive bra designed especially for nursing. Avoid wearing underwire-style bras or extremely tight bras (sports bras).  Air-dry your nipples for 3-4 minutes after each feeding.  Use only cotton  bra pads to absorb leaked breast milk. Leaking of breast milk between feedings is normal.  Use lanolin on your nipples after breastfeeding. Lanolin helps to maintain your skin's normal moisture barrier. Pure lanolin is not harmful (not toxic) to your baby. You may also hand express a few drops of breast milk and gently massage that milk into your nipples and allow the milk to air-dry. In the first few weeks after giving birth, some women experience breast engorgement. Engorgement can make your breasts feel heavy, warm, and tender to the touch. Engorgement peaks within 3-5 days after you give birth. The following recommendations can help to ease engorgement:  Completely empty your breasts while breastfeeding or pumping. You may want to start by applying warm, moist heat (in the shower or with warm, water-soaked hand towels) just before feeding or pumping. This increases circulation and helps the milk flow. If your baby does not completely empty your breasts while breastfeeding, pump any extra milk after he or she is finished.  Apply ice packs to your breasts immediately after breastfeeding or pumping, unless this is too uncomfortable for you. To do this: ? Put ice in a plastic bag. ? Place a towel between your skin and the bag. ? Leave the ice on for 20 minutes, 2-3 times a day.  Make sure that your baby is latched on and positioned properly while breastfeeding. If engorgement persists  after 48 hours of following these recommendations, contact your health care provider or a Science writer. Overall health care recommendations while breastfeeding  Eat 3 healthy meals and 3 snacks every day. Well-nourished mothers who are breastfeeding need an additional 450-500 calories a day. You can meet this requirement by increasing the amount of a balanced diet that you eat.  Drink enough water to keep your urine pale yellow or clear.  Rest often, relax, and continue to take your prenatal vitamins to prevent fatigue, stress, and low vitamin and mineral levels in your body (nutrient deficiencies).  Do not use any products that contain nicotine or tobacco, such as cigarettes and e-cigarettes. Your baby may be harmed by chemicals from cigarettes that pass into breast milk and exposure to secondhand smoke. If you need help quitting, ask your health care provider.  Avoid alcohol.  Do not use illegal drugs or marijuana.  Talk with your health care provider before taking any medicines. These include over-the-counter and prescription medicines as well as vitamins and herbal supplements. Some medicines that may be harmful to your baby can pass through breast milk.  It is possible to become pregnant while breastfeeding. If birth control is desired, ask your health care provider about options that will be safe while breastfeeding your baby. Where to find more information: [employer]: www.llli.org Contact a health care provider if:  You feel like you want to stop breastfeeding or have become frustrated with breastfeeding.  Your nipples are cracked or bleeding.  Your breasts are red, tender, or warm.  You have: ? Painful breasts or nipples. ? A swollen area on either breast. ? A fever or chills. ? Nausea or vomiting. ? Drainage other than breast milk from your nipples.  Your breasts do not become full before feedings by the fifth day after you give birth.  You  feel sad and depressed.  Your baby is: ? Too sleepy to eat well. ? Having trouble sleeping. ? More than 84 week old and wetting fewer than 6 diapers in a 24-hour period. ? Not gaining weight by  63 days of age.  Your baby has fewer than 3 stools in a 24-hour period.  Your baby's skin or the white parts of his or her eyes become yellow. Get help right away if:  Your baby is overly tired (lethargic) and does not want to wake up and feed.  Your baby develops an unexplained fever. Summary  Breastfeeding offers many health benefits for infant and mothers.  Try to breastfeed your infant when he or she shows early signs of hunger.  Gently tickle or stroke your baby's lips with your finger or nipple to allow the baby to open his or her mouth. Bring the baby to your breast. Make sure that much of the areola is in your baby's mouth. Offer one side and burp the baby before you offer the other side.  Talk with your health care provider or lactation consultant if you have questions or you face problems as you breastfeed. This information is not intended to replace advice given to you by your health care provider. Make sure you discuss any questions you have with your health care provider. Document Revised: 05/09/2017 Document Reviewed: 03/16/2016 Elsevier Patient Education  [place].

## 2019-06-10 NOTE — Progress Notes (Addendum)
   PRENATAL VISIT NOTE  Subjective:  Alice Castillo is a 24 y.o. [id number] at 85w4dbeing seen today for ongoing prenatal care.  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  Patient reports no complaints.  Contractions: Not present. Vag. Bleeding: None.  Movement: Present. Denies leaking of fluid.   The following portions of the patient's history were reviewed and updated as appropriate: allergies, current medications, past family history, past medical history, past social history, past surgical history and problem list.   Objective:   Vitals:   06/10/19 1428  BP: 111/69  Pulse: 93  Weight: 158 lb 12.8 oz (72 kg)    Fetal Status: Fetal Heart Rate (bpm): 148 Fundal Height: 35 cm Movement: Present  Presentation: Vertex  General:  Alert, oriented and cooperative. Patient is in no acute distress.  Skin: Skin is warm and dry. No rash noted.   Cardiovascular: Normal heart rate noted  Respiratory: Normal respiratory effort, no problems with respiration noted  Abdomen: Soft, gravid, appropriate for gestational age.  Pain/Pressure: Present     Pelvic: Cervical exam performed in the presence of a chaperone Dilation: 1 Effacement (%): 60 Station: -3  Extremities: Normal range of motion.  Edema: None  Mental Status: Normal mood and affect. Normal behavior. Normal judgment and thought content.   Assessment and Plan:  Pregnancy: G4P1021 at [redacted]w[redacted]d. Supervision of high risk pregnancy,  antepartum Cultures today - Culture, beta strep (group b only) - Cervicovaginal ancillary only( [place]. Positive depression screening - Ambulatory referral to [facility]. Major depressive disorder, recurrent, severe with psychotic features (HC[place] # 2  4. Factor XI deficiency (HCLattaDiscussed treatment at time of delivery  5. Rh negative state in antepartum period S/p rhogam  6. Rubella non-immune status, antepartum Needs MMR pp  Preterm labor symptoms and general obstetric precautions including but not limited to vaginal bleeding, contractions, leaking of fluid and fetal movement were reviewed in detail with the patient. Please refer to After Visit Summary for other counseling recommendations.   Return in about 1 week (around 06/17/2019) for virtual, [place] Future Appointments  Date Time Provider Department Center  06/18/2019  2:15 PM ArWoodroe ModeMD [facility]  2:45 PM [facility]  2:15 PM ArWoodroe ModeMD [facility]  07/09/2019  2:15 PM [facility]Donnamae [facility]    TaDonnamae JudeMD

## 2019-06-10 NOTE — Patient Instructions (Signed)
At [facility] [employer], we work as an integrated team, providing care to address both physical and emotional health. Your medical provider may refer you to see our [facility]) on the same day you see your medical provider, as availability permits.  Our [facility] is available to all patients, visits generally last between 20-30 minutes, but can be longer or shorter, depending on patient need. The [facility] offers help with stress management, coping with symptoms of depression and anxiety, major life changes , sleep issues, changing risky behavior, grief and loss, life stress, working on personal life goals, and  behavioral health issues, as these all affect your overall health and wellness.  The [facility] is NOT available for the following: court-ordered evaluations, specialty assessments (custody or disability), letters to employers, or obtaining certification for an emotional support animal. The [facility] does not provide long-term therapeutic services. You have the right to refuse integrated behavioral health services, or to reschedule to see the [facility] at a later date.  Exception: If you are having thoughts of suicide, we require that you either see the [facility] for further assessment, or contract for safety with your medical provider prior to checking out.  Confidentiality exception: If it is suspected that a child or disabled adult is being abused or neglected, we are required by law to report that to either Child Protective Services or Adult Scientist, forensic.  If you have a diagnosis of Bipolar affective disorder, Schizophrenia, or recurrent Major depressive disorder, we will recommend that you establish care with a psychiatrist, as these are lifelong, chronic conditions, and we want your overall emotional health and medications to be more closely monitored. If you anticipate needing extended maternity leave due to mental illness, it it recommended that you find a psychiatrist as soon as possible.  Neither the medical provider, nor the [facility], can recommend an extended maternity leave for mental health issues. Your medical provider or [facility] may refer you to a therapist for ongoing, traditional therapy, or to a psychiatrist, for medication management, if it would benefit your overall health. Depending on your insurance, you may have a copay to see the [facility]. If you are uninsured, it is recommended that you apply for financial assistance. (Forms may be requested at the front desk for in-person visits, via MyChart, or request a form during a virtual visit).  If you see the [facility] more than 6 times, you will have to complete a comprehensive clinical assessment interview with the [facility] to resume integrated services.  Any questions?      BRAINSTORMING  Develop a Plan Goals: . Provide a way to start conversation about your new life with a baby . Assist parents in recognizing and using resources within their reach . Help pave the way before birth for an easier period of transition afterwards.  Make a list of the following information to keep in a central location: . Full name of Mom and Partner: _____________________________________________ . 80 full name and Date of Birth: ___________________________________________ . Home Address: ___________________________________________________________ ________________________________________________________________________ . Home Phone: ____________________________________________________________ . Parents' cell numbers: _____________________________________________________ ________________________________________________________________________ . Name and contact info for OB: ______________________________________________ . Name and contact info for Pediatrician:________________________________________ . Contact info for Lactation Consultants: ________________________________________  REST and SLEEP *You each need at least 4-5 hours of uninterrupted sleep  every day. Write specific names and contact information.* . How are you going to rest in the postpartum period? While partner's home? When partner returns to work? When you both return to work? Marland Kitchen Where  will your baby sleep? Marland Kitchen Who is available to help during the day? Evening? Night? . Who could move in for a period to help support you? Marland Kitchen What are some ideas to help you get enough sleep? __________________________________________________________________________________________________________________________________________________________________________________________________________________________________________ NUTRITIOUS FOOD AND DRINK *Plan for meals before your baby is born so you can have healthy food to eat during the immediate postpartum period.* . Who will look after breakfast? Lunch? Dinner? List names and contact information. Brainstorm quick, healthy ideas for each meal. . What can you do before baby is born to prepare meals for the postpartum period? . How can others help you with meals? Marland Kitchen Which grocery stores provide online shopping and delivery? Marland Kitchen Which restaurants offer take-out or delivery options? ______________________________________________________________________________________________________________________________________________________________________________________________________________________________________________________________________________________________________________________________________________________________________________________________________  CARE FOR MOM *It's important that mom is cared for and pampered in the postpartum period. Remember, the most important ways new mothers need care are: sleep, nutrition, gentle exercise, and time off.* . Who can come take care of mom during this period? Make a list of people with their contact information. . List some activities that make you feel cared for, rested, and energized? Who can make sure  you have opportunities to do these things? . Does mom have a space of her very own within your home that's just for her? Make a "[facility]" where she can be comfortable, rest, and renew herself daily. ______________________________________________________________________________________________________________________________________________________________________________________________________________________________________________________________________________________________________________________________________________________________________________________________________    CARE FOR AND FEEDING BABY *Knowledgeable and encouraging people will offer the best support with regard to feeding your baby.* . Educate yourself and choose the best feeding option for your baby. . Make a list of people who will guide, support, and be a resource for you as your care for and feed your baby. (Friends that have breastfed or are currently breastfeeding, lactation consultants, breastfeeding support groups, etc.) . Consider a postpartum doula. (These websites can give you information: dona.org & BuyingShow.es) . Seek out local breastfeeding resources like the breastfeeding support group at [employer] or [employer]. ______________________________________________________________________________________________________________________________________________________________________________________________________________________________________________________________________________________________________________________________________________________________________________________________________  Verner Chol AND ERRANDS . Who can help with a thorough cleaning before baby is born? . Make a list of people who will help with housekeeping and chores, like laundry, light cleaning, dishes, bathrooms, etc. . Who can run some errands for you? Marland Kitchen What can you do to make sure you are stocked with basic supplies before  baby is born? . Who is going to do the shopping? ______________________________________________________________________________________________________________________________________________________________________________________________________________________________________________________________________________________________________________________________________________________________________________________________________     Family Adjustment *Nurture yourselves.it helps parents be more loving and allows for better bonding with their child.* . What sorts of things do you and partner enjoy doing together? Which activities help you to connect and strengthen your relationship? Make a list of those things. Make a list of people whom you trust to care for your baby so you can have some time together as a couple. . What types of things help partner feel connected to Mom? Make a list. . What needs will partner have in order to bond with baby? . Other children? Who will care for them when you go into labor and while you are in the hospital? . Think about what the needs of your older children might be. Who can help you meet those needs? In what ways are you helping them prepare for bringing baby home? List some specific strategies you have for family adjustment. _______________________________________________________________________________________________________________________________________________________________________________________________________________________________________________________________________________________________________________________________________________  SUPPORT *Someone who can empathize with experiences normalizes your problems and makes them more bearable.* . Make a list of other friends, neighbors, and/or co-workers you  know with infants (and small children, if applicable) with whom you can connect. . Make a list of local or online support groups,  mom groups, etc. in which you can be involved. ______________________________________________________________________________________________________________________________________________________________________________________________________________________________________________________________________________________________________________________________________________________________________________________________________  Childcare Plans . Investigate and plan for childcare if mom is returning to work. . Talk about mom's concerns about her transition back to work. . Talk about partner's concerns regarding this transition.  Mental Health *Your mental health is one of the highest priorities for a pregnant or postpartum mom.* . 1 in 5 women experience anxiety and/or depression from the time of conception through the first year after birth. . Postpartum Mood Disorders are the #1 complication of pregnancy and childbirth and the suffering experienced by these mothers is not necessary! These illnesses are temporary and respond well to treatment, which often includes self-care, social support, talk therapy, and medication when needed. . Women experiencing anxiety and depression often say things like: "I'm supposed to be happy.why do I feel so sad?", "Why can't I snap out of it?", "I'm having thoughts that scare me." . There is no need to be embarrassed if you are feeling these symptoms: o Overwhelmed, anxious, angry, sad, guilty, irritable, hopeless, exhausted but can't sleep o You are NOT alone. You are NOT to blame. With help, you WILL be well. . Where can I find help? Medical professionals such as your OB, midwife, gynecologist, family practitioner, primary care provider, pediatrician, or mental health providers; [facility] support groups: Feelings After Birth, Breastfeeding Support Group, Baby and Me Group, and Fit 4 Two exercise classes. . You have permission to ask for help. It will  confirm your feelings, validate your experiences, share/learn coping strategies, and gain support and encouragement as you heal. You are important! BRAINSTORM . Make a list of local resources, including resources for mom and for partner. . Identify support groups. . Identify people to call late at night - include names and contact info. . Talk with partner about perinatal mood and anxiety disorders. . Talk with your OB, midwife, and doula about baby blues and about perinatal mood and anxiety disorders. . Talk with your pediatrician about perinatal mood and anxiety disorders.   Support & Sanity Savers   What do you really need?  . Basics . In preparing for a new baby, many expectant parents spend hours shopping for baby clothes, decorating the nursery, and deciding which car seat to buy. Yet most don't think much about what the reality of parenting a newborn will be like, and what they need to make it through that. So, here is the advice of experienced parents. We know you'll read this, and think "they're exaggerating, I don't really need that." Just trust [place] on these, OK? Plan for all of this, and if it turns out you don't need it, come back and teach [place] how you did it!  Satira Anis (Once baby's survival needs are met, make sure you attend to your own survival needs!) . Sleep . An average newborn sleeps 16-18 hours per day, over 6-7 sleep periods, rarely more than three hours at a time. It is normal and healthy for a newborn to wake throughout the night... but really hard on parents!! . Naps. Prioritize sleep above any responsibilities like: cleaning house, visiting friends, running errands, etc.  Sleep whenever baby sleeps. If you can't nap, at least have restful times when baby eats. The more rest you get, the more patient you will be, the more emotionally stable, and better at solving problems.  Marland Kitchen  Food . You may not have realized it would be difficult to eat when you have a newborn. Yet,  when we talk to . countless new parents, they say things like "it may be 2:00 pm when I realize I haven't had breakfast yet." Or "every time we sit down to dinner, baby needs to eat, and my food gets cold, so I don't bother to eat it." . Finger food. Before your baby is born, stock up with one months' worth of food that: 1) you can eat with one hand while holding a baby, 2) doesn't need to be prepped, 3) is good hot or cold, 4) doesn't spoil when left out for a few hours, and 5) you like to eat. Think about: nuts, dried fruit, Clif bars, pretzels, jerky, gogurt, baby carrots, apples, bananas, crackers, cheez-n-crackers, string cheese, hot pockets or frozen burritos to microwave, garden burgers and breakfast pastries to put in the toaster, yogurt drinks, etc. . Restaurant Menus. Make lists of your favorite restaurants & menu items. When family/friends want to help, you can give specific information without much thought. They can either bring you the food or send gift cards for just the right meals. Rosaura Carpenter Meals.  Take some time to make a few meals to put in the freezer ahead of time.  Easy to freeze meals can be anything such as soup, lasagna, chicken pie, or spaghetti sauce. . Set up a Meal Schedule.  Ask friends and family to sign up to bring you meals during the first few weeks of being home. (It can be passed around at baby showers!) You have no idea how helpful this will be until you are in the throes of parenting.  https://hamilton-woodard.com/ is a great website to check out. . Emotional Support . Know who to call when you're stressed out. Parenting a newborn is very challenging work. There are times when it totally overwhelms your normal coping abilities. EVERY NEW PARENT NEEDS TO HAVE A PLAN FOR WHO TO CALL WHEN THEY JUST CAN'T COPE ANY MORE. (And it has to be someone other than the baby's other parent!) Before your baby is born, come up with at least one person you can call for support - write their phone  number down and post it on the refrigerator. Marland Kitchen Anxiety & Sadness. Baby blues are normal after pregnancy; however, there are more severe types of anxiety & sadness which can occur and should not be ignored.  They are always treatable, but you have to take the first step by reaching out for help. [facility] offers a "Mom Talk" group which meets every Tuesday from 10 am - 11 am.  This group is for new moms who need support and connection after their babies are born.  Call [phone].  Marland Kitchen Really, Really Helpful (Plan for them! Make sure these happen often!!) . Physical Support with Taking Care of Yourselves . Asking friends and family. Before your baby is born, set up a schedule of people who can come and visit and help out (or ask a friend to schedule for you). Any time someone says "let me know what I can do to help," sign them up for a day. When they get there, their job is not to take care of the baby (that's your job and your joy). Their job is to take care of you!  . Postpartum doulas. If you don't have anyone you can call on for support, look into postpartum doulas:  professionals at helping parents with  caring for baby, caring for themselves, getting breastfeeding started, and helping with household tasks. www.padanc.org is a helpful website for learning about doulas in our area. . Peer Support / Parent Groups . Why: One of the greatest ideas for new parents is to be around other new parents. Parent groups give you a chance to share and listen to others who are going through the same season of life, get a sense of what is normal infant development by watching several babies learn and grow, share your stories of triumph and struggles with empathetic ears, and forgive your own mistakes when you realize all parents are learning by trial and error. . Where to find: There are many places you can meet other new parents throughout our community.  [facility] offers the following classes for new moms  and their little ones:  Baby and Me (Birth to Mulat) and Breastfeeding Support Group. Go to www.conehealthybaby.com or call [phone] for more information. . Time for your Relationship . It's easy to get so caught up in meeting baby's immediate needs that it's hard to find time to connect with your partner, and meet the needs of your relationship. It's also easy to forget what "quality time with your partner" actually looks like. If you take your baby on a date, you'd be amazed how much of your couple time is spent feeding the baby, diapering the baby, admiring the baby, and talking about the baby. . Dating: Try to take time for just the two of you. Babysitter tip: Sometimes when moms are breastfeeding a newborn, they find it hard to figure out how to schedule outings around baby's unpredictable feeding schedules. Have the babysitter come for a three hour period. When she comes over, if baby has just eaten, you can leave right away, and come back in two hours. If baby hasn't fed recently, you start the date at home. Once baby gets hungry and gets a good feeding in, you can head out for the rest of your date time. . Date Nights at Home: If you can't get out, at least set aside one evening a week to prioritize your relationship: whenever baby dozes off or doesn't have any immediate needs, spend a little time focusing on each other. . Potential conflicts: The main relationship conflicts that come up for new parents are: issues related to sexuality, financial stresses, a feeling of an unfair division of household tasks, and conflicts in parenting styles. The more you can work on these issues before baby arrives, the better!  Clint Guy and Frills (Don't forget these. and don't feel guilty for indulging in them!) . Everyone has something in life that is a fun little treat that they do just for themselves. It may be: reading the morning paper, or going for a daily jog, or having coffee with a friend once a week,  or going to a movie on Friday nights, or fine chocolates, or bubble baths, or curling up with a good book. . Unless you do fun things for yourself every now and then, it's hard to have the energy for fun with your baby. Whatever your "special" treats are, make sure you find a way to continue to indulge in them after your baby is born. These special moments can recharge you, and allow you to return to baby with a new joy   PERINATAL MOOD DISORDERS: [place]   Emergency and Crisis Resources:  If you are an imminent risk to  self or others, are experiencing intense personal distress, and/or have noticed significant changes in activities of daily living, call:  . 911 . [facility]: [phone] . Mobile Crisis: [phone] . National Suicide Hotline: 681-356-8481 Or visit the following crisis centers: . Local Emergency Departments . [facility]: [address] [place]. Hours: 8:30AM-5PM. Insurance Accepted: Medicaid, Medicare, and Uninsured.  Marland Kitchen [facility]  [address] [place] Mon-Friday 8am-3pm  [phone]                                                                                    Non-Crisis Resources: To identify specific providers that are covered by your insurance, contact your insurance company or local agencies: [place]: [phone] [facility] and [place]: [phone] [place]: [phone] Postpartum Support International- [place] 1-905-230-1667                                                      Outpatient therapy and medication management providers:  [facility] [phone] Hours: 9AM-5PM  Insurance Accepted: Alben Spittle, Lorella Nimrod, Freddrick March, [place], Medicare  [facility] ([place]) [phone] Hours: 8AM-5PM  nsurance Accepted: All insurances EXCEPT AARP,  [place], [place], and [place]: [phone]             Hours: 8AM-8PM Insurance Accepted: Cristal Ford, Freddrick March, [place], Medicare, [phone] [facility]: [phone] Hours: 8:30AM-7PM Insurance Accepted: Cristal Ford, Medicaid, Medicare, Tricare, [employer] Counseling:  [place] Accepted:  Holland Falling, Lorella Nimrod, [facility], [place], [facility] [phone] Hours: 9AM-5:30PM Insurance Accepted: Alben Spittle, Charlotte Crumb, and Medicaid, Medicare, [employer] [facility] Counsel[facility]:  [phone] Hours: 9am-5pm Insurance Accepted: BCBS; they do not accept Medicaid/Medicare [place]: [phone] Hours: 9am-9pm Insurance Accepted: All major insurance including Medicaid and Medicare [facility]: [phone] Hours: 9AM-5:30PM Insurance Accepted: All insurances EXCEPT Medicaid and Medicare. [facility]: [place]: [phone] [place]:  [place] (support for children in the NICU and/or with special needs), [phone]  Mental Health Support Groups Mental Health Association: [phone]                                                                                     Online Resources: Postpartum Support International: http://jones-berg.com/  962-836-6QHU 2Moms Supporting Moms:  www.momssupportingmoms.net                                                                                                  /Emotional [employer] and Websites Here are a few free apps meant to help you to help yourself.  To find, try  searching on the internet to see if the app is offered on Apple/Android devices. If your first choice doesn't come up on your device, the good news is that there are many choices! Play around with different apps to see which ones are helpful to you.    Calm This is an app meant to help increase calm feelings. Includes info, strategies, and tools for tracking your feelings.      Calm Harm  This app is meant to help with self-harm. Provides many 5-minute or 15-min coping strategies for doing instead of hurting yourself.       [place] is a problem-solving tool to help deal with emotions and cope with stress you encounter wherever you are.      MindShift This app can help people cope with anxiety. Rather than trying to avoid anxiety, you can make an important shift and face it.      MY3  MY3 features a support system, safety plan and resources with the goal of offering a tool to use in a time of need.       My Life My Voice  This mood journal offers a simple solution for tracking your thoughts, feelings and moods. Animated emoticons can help identify your mood.       Relax Melodies Designed to help with sleep, on this app you can mix sounds and meditations for relaxation.      Smiling Mind Smiling Mind is meditation made easy: it's a simple tool that helps put a smile on your mind.        Stop, Breathe & Think  A friendly, simple guide for people through meditations for mindfulness and compassion.  Stop, Breathe and Think Kids Enter your current feelings and choose a "mission" to help you cope. Offers videos for certain moods instead of just sound recordings.       Team Orange The goal of this tool is to help teens change how they think, act, and react. This app helps you focus on your own good feelings and experiences.      The [place] Box The [place] Box (VHB) contains simple tools to help patients with  coping, relaxation,  distraction, and positive thinking.

## 2019-06-11 LAB — CERVICOVAGINAL ANCILLARY ONLY
Chlamydia: NEGATIVE
Comment: NEGATIVE
Comment: NORMAL
Neisseria Gonorrhea: NEGATIVE

## 2019-06-14 LAB — CULTURE, BETA STREP (GROUP B ONLY): Strep Gp B Culture: NEGATIVE

## 2019-06-18 ENCOUNTER — Telehealth (INDEPENDENT_AMBULATORY_CARE_PROVIDER_SITE_OTHER): Payer: BC Managed Care – PPO | Admitting: Obstetrics & Gynecology

## 2019-06-18 ENCOUNTER — Encounter: Payer: Self-pay | Admitting: Obstetrics & Gynecology

## 2019-06-18 VITALS — BP 113/71 | HR 84

## 2019-06-18 DIAGNOSIS — Z3A37 37 weeks gestation of pregnancy: Secondary | ICD-10-CM

## 2019-06-18 DIAGNOSIS — O99343 Other mental disorders complicating pregnancy, third trimester: Secondary | ICD-10-CM

## 2019-06-18 DIAGNOSIS — D681 Hereditary factor XI deficiency: Secondary | ICD-10-CM

## 2019-06-18 DIAGNOSIS — O0990 Supervision of high risk pregnancy, unspecified, unspecified trimester: Secondary | ICD-10-CM

## 2019-06-18 DIAGNOSIS — O99113 Other diseases of the blood and blood-forming organs and certain disorders involving the immune mechanism complicating pregnancy, third trimester: Secondary | ICD-10-CM

## 2019-06-18 NOTE — Progress Notes (Signed)
I connected with  Alice Castillo on 06/18/19 at 1338 by telephone and verified that I am speaking with the correct person using two identifiers.   I discussed the limitations, risks, security and privacy concerns of performing an evaluation and management service by telephone and the availability of in person appointments. I also discussed with the patient that there may be a patient responsible charge related to this service. The patient expressed understanding and agreed to proceed.  Annabell Howells, RN 06/18/2019  1:38 PM

## 2019-06-18 NOTE — Progress Notes (Signed)
   TELEHEALTH VIRTUAL OBSTETRICS VISIT ENCOUNTER NOTE  I connected with Alice Castillo on 06/18/19 at  2:15 PM EDT by telephone at home and verified that I am speaking with the correct person using two identifiers.   I discussed the limitations, risks, security and privacy concerns of performing an evaluation and management service by telephone and the availability of in person appointments. I also discussed with the patient that there may be a patient responsible charge related to this service. The patient expressed understanding and agreed to proceed.  Subjective:  Alice Castillo is a 24 y.o. G4P1021 at [redacted]w[redacted]d being followed for ongoing prenatal care.  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  Patient reports no complaints. Reports fetal movement. Denies any contractions, bleeding or leaking of fluid.   The following portions of the patient's history were reviewed and updated as appropriate: allergies, current medications, past family history, past medical history, past social history, past surgical history and problem list.   Objective:   General:  Alert, oriented and cooperative.   Mental Status: Normal mood and affect perceived. Normal judgment and thought content.  Rest of physical exam deferred due to type of encounter  Assessment and Plan:  Pregnancy: G4P1021 at [redacted]w[redacted]d 1. Supervision of high risk pregnancy, antepartum Dong well  2. 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  I discussed the assessment and treatment plan with the patient. The patient was provided an opportunity to ask questions and all were answered. The patient agreed with the plan and demonstrated an understanding of the instructions. The patient was advised to call back or seek an in-person office evaluation/go to [facility]U at [facility] [place] for any urgent or concerning symptoms. Please refer to After Visit Summary for other counseling recommendations.   I provided 12 minutes of non-face-to-face time during this encounter.  Return in about 1 week (around 06/25/2019) for in person.  Future Appointments  Date Time Provider [place]  06/18/2019  2:15 PM Woodroe Mode, MD [facility] [place]  06/22/2019  2:45 PM [place]  06/24/2019  2:15 PM Woodroe Mode, MD [facility] [place]  07/09/2019  2:15 PM Donnamae Jude, MD [place]    Emeterio Reeve, [place] for [facility] [place] [facility]

## 2019-06-18 NOTE — Patient Instructions (Signed)

## 2019-06-22 ENCOUNTER — Ambulatory Visit (INDEPENDENT_AMBULATORY_CARE_PROVIDER_SITE_OTHER): Payer: BC Managed Care – PPO | Admitting: Clinical

## 2019-06-22 DIAGNOSIS — F4310 Post-traumatic stress disorder, unspecified: Secondary | ICD-10-CM | POA: Diagnosis not present
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
## 2019-06-22 NOTE — Patient Instructions (Addendum)
   [facility]:  [address]nd floor, [place], [place] [address] [phone])  Main line [phone]  [facility] location  [facility]:  [address], [place], [place] [address] [phone]) Main line [phone]  [place] location   Walk-in hours: Monday -Friday 8:30am-4:30am  [email or web address]   /Emotional Wellbeing Apps and Websites Here are a few free apps meant to help you to help yourself.  To find, try searching on the internet to see if the app is offered on Apple/Android devices. If your first choice doesn't come up on your device, the good news is that there are many choices! Play around with different apps to see which ones are helpful to you.    Calm This is an app meant to help increase calm feelings. Includes info, strategies, and tools for tracking your feelings.      Calm Harm  This app is meant to help with self-harm. Provides many 5-minute or 15-min coping strategies for doing instead of hurting yourself.       [place] is a problem-solving tool to help deal with emotions and cope with stress you encounter wherever you are.      MindShift This app can help people cope with anxiety. Rather than trying to avoid anxiety, you can make an important shift and face it.      MY3  MY3 features a support system, safety plan and resources with the goal of offering a tool to use in a time of need.       My Life My Voice  This mood journal offers a simple solution for tracking your thoughts, feelings and moods. Animated emoticons can help identify your mood.       Relax Melodies Designed to help with sleep, on this app you can mix sounds and meditations for relaxation.      Smiling Mind Smiling Mind is meditation made easy: it's a simple tool that helps put a smile on your mind.        Stop, Breathe & Think   A friendly, simple guide for people through meditations for mindfulness and compassion.  Stop, Breathe and Think Kids Enter your current feelings and choose a "mission" to help you cope. Offers videos for certain moods instead of just sound recordings.       Team Orange The goal of this tool is to help teens change how they think, act, and react. This app helps you focus on your own good feelings and experiences.      The [place] Box The [place] Box (VHB) contains simple tools to help patients with coping, relaxation, distraction, and positive thinking.

## 2019-06-24 ENCOUNTER — Encounter: Payer: BC Managed Care – PPO | Admitting: Obstetrics & Gynecology

## 2019-07-02 ENCOUNTER — Other Ambulatory Visit: Payer: Self-pay

## 2019-07-02 ENCOUNTER — Telehealth (HOSPITAL_COMMUNITY): Payer: Self-pay | Admitting: *Deleted

## 2019-07-02 ENCOUNTER — Ambulatory Visit (INDEPENDENT_AMBULATORY_CARE_PROVIDER_SITE_OTHER): Payer: BC Managed Care – PPO | Admitting: Obstetrics and Gynecology

## 2019-07-02 VITALS — BP 114/72 | HR 75 | Wt 163.6 lb

## 2019-07-02 DIAGNOSIS — O99113 Other diseases of the blood and blood-forming organs and certain disorders involving the immune mechanism complicating pregnancy, third trimester: Secondary | ICD-10-CM

## 2019-07-02 DIAGNOSIS — O0990 Supervision of high risk pregnancy, unspecified, unspecified trimester: Secondary | ICD-10-CM

## 2019-07-02 DIAGNOSIS — Z3A39 39 weeks gestation of pregnancy: Secondary | ICD-10-CM

## 2019-07-02 DIAGNOSIS — O0993 Supervision of high risk pregnancy, unspecified, third trimester: Secondary | ICD-10-CM

## 2019-07-02 DIAGNOSIS — D681 Hereditary factor XI deficiency: Secondary | ICD-10-CM

## 2019-07-02 NOTE — BH Specialist Note (Signed)
Integrated Behavioral Health via Telemedicine Video Visit  07/02/2019 Alleene Funez [id number]  Number of [place] visits: 3 Session Start time: 3:15 Session End time: 3:44 Total time: 29  Referring Provider: Emeterio Reeve, MD Type of Visit: Video Patient/Family location: Home [facility] Provider location: [facility] for [place] [facility]  All persons participating in visit: Patient Alice Castillo and [place]    Confirmed patient's address: Yes  Confirmed patient's phone number: Yes  Any changes to demographics: No   Confirmed patient's insurance: Yes  Any changes to patient's insurance: No   Discussed confidentiality: At previous visit  I connected with [name] by a video enabled telemedicine application and verified that I am speaking with the correct person using two identifiers.     I discussed the limitations of evaluation and management by telemedicine and the availability of in person appointments.  I discussed that the purpose of this visit is to provide behavioral health care while limiting exposure to the novel coronavirus.   Discussed there is a possibility of technology failure and discussed alternative modes of communication if that failure occurs.  I discussed that engaging in this video visit, they consent to the provision of behavioral healthcare and the services will be billed under their insurance.  Patient and/or legal guardian expressed understanding and consented to video visit: Yes   PRESENTING CONCERNS: Patient and/or family reports the following symptoms/concerns: Pt states her primary concern today is "projectile vomiting" yesterday after drinking water, poor appetite (ate a small bowl of cereal today), and difficulty sleeping, attributed to end of pregnancy/uncomfortable. Pt goal remains to prevent depression, anxiety and hallucinations postpartum, would like to see clinical social worker after  birth in hospital, and will consider medication postpartum if needed.  Duration of problem: Current pregnancy; Severity of problem: moderately severe  STRENGTHS (Protective Factors/Coping Skills): Supportive boyfriend; open to treatment  GOALS ADDRESSED: Patient will: 1.  Reduce symptoms of: anxiety, depression and stress  2.  Increase knowledge and/or ability of: healthy habits  3.  Demonstrate ability to: Increase healthy adjustment to current life circumstances, Increase adequate support systems for patient/family and Increase motivation to adhere to plan of care  INTERVENTIONS: Interventions utilized:  Solution-Focused Strategies, Psychoeducation and/or Health Education and Link to [employer] Assessments completed: PHQ9/GAD7 given in past two weeks  ASSESSMENT: Patient currently experiencing PTSD.   Patient may benefit from .psychoeducation and brief therapeutic interventions regarding coping with symptoms of depression, anxiety, and life stress .  PLAN: 1. Follow up with behavioral health clinician on : One week postpartum 2. Behavioral recommendations:  -Continue taking prenatal vitamin daily  -Continue working with [facility], as needed -Read through [employer] today; share with boyfriend -Continue to check voicemail, as psychiatry will call to schedule initial appointment -Expect nurse to call today concerning medical concern 3. Referral(s): [place] (In Clinic) and Psychiatrist  I discussed the assessment and treatment plan with the patient and/or parent/guardian. They were provided an opportunity to ask questions and all were answered. They agreed with the plan and demonstrated an understanding of the instructions.   They were advised to call back or seek an in-person evaluation if the symptoms worsen or if the condition fails to improve as anticipated.  [name] Tamika Nou  Depression screen [facility] 2/9  06/18/2019 06/10/2019 04/29/2019 04/13/2019  Decreased Interest 3 2 2 2   Down, Depressed, Hopeless 3 2 3 2   PHQ - 2 Score 6  4 5 4   Altered sleeping 3 3 3 3   Tired, decreased energy 3 3 3 3   Change in appetite 3 3 2 3   Feeling bad or failure about yourself  0 2 0 0  Trouble concentrating 1 2 1 1   Moving slowly or fidgety/restless 0 0 1 0  Suicidal thoughts 0 0 0 0  PHQ-9 Score 16 17 15 14    GAD 7 : Generalized Anxiety Score 06/18/2019 06/10/2019 04/29/2019 04/13/2019  Nervous, Anxious, on Edge 1 3 2 2   Control/stop worrying 3 3 1 1   Worry too much - different things 3 3 0 2  Trouble relaxing 3 3 3 3   Restless 1 3 3 2   Easily annoyed or irritable 3 3 3 3   Afraid - awful might happen 3 3 2 2   Total GAD 7 Score 17 21 14  15

## 2019-07-02 NOTE — Telephone Encounter (Signed)
Preadmission screen  

## 2019-07-06 ENCOUNTER — Ambulatory Visit (INDEPENDENT_AMBULATORY_CARE_PROVIDER_SITE_OTHER): Payer: Medicaid Other | Admitting: Clinical

## 2019-07-06 ENCOUNTER — Other Ambulatory Visit: Payer: Self-pay | Admitting: Family Medicine

## 2019-07-06 ENCOUNTER — Other Ambulatory Visit: Payer: Self-pay

## 2019-07-06 ENCOUNTER — Telehealth (HOSPITAL_COMMUNITY): Payer: Self-pay | Admitting: *Deleted

## 2019-07-06 DIAGNOSIS — O99340 Other mental disorders complicating pregnancy, unspecified trimester: Secondary | ICD-10-CM

## 2019-07-06 DIAGNOSIS — Z3A00 Weeks of gestation of pregnancy not specified: Secondary | ICD-10-CM | POA: Diagnosis not present

## 2019-07-06 DIAGNOSIS — F4310 Post-traumatic stress disorder, unspecified: Secondary | ICD-10-CM

## 2019-07-06 NOTE — Patient Instructions (Addendum)
[facility] [place], [place] [address] [phone] (main office) [phone] (Hoover Grewe's number)      BRAINSTORMING  Develop a Plan Goals: . Provide a way to start conversation about your new life with a baby . Assist parents in recognizing and using resources within their reach . Help pave the way before birth for an easier period of transition afterwards.  Make a list of the following information to keep in a central location: . Full name of Mom and Partner: _____________________________________________ . 58 full name and Date of Birth: ___________________________________________ . Home Address: ___________________________________________________________ ________________________________________________________________________ . Home Phone: ____________________________________________________________ . Parents' cell numbers: _____________________________________________________ ________________________________________________________________________ . Name and contact info for OB: ______________________________________________ . Name and contact info for Pediatrician:________________________________________ . Contact info for Lactation Consultants: ________________________________________  REST and SLEEP *You each need at least 4-5 hours of uninterrupted sleep every day. Write specific names and contact information.* . How are you going to rest in the postpartum period? While partner's home? When partner returns to work? When you both return to work? Marland Kitchen Where will your baby sleep? Marland Kitchen Who is available to help during the day? Evening? Night? . Who could move in for a period to help support you? Marland Kitchen What are some ideas to help you get enough  sleep? __________________________________________________________________________________________________________________________________________________________________________________________________________________________________________ NUTRITIOUS FOOD AND DRINK *Plan for meals before your baby is born so you can have healthy food to eat during the immediate postpartum period.* . Who will look after breakfast? Lunch? Dinner? List names and contact information. Brainstorm quick, healthy ideas for each meal. . What can you do before baby is born to prepare meals for the postpartum period? . How can others help you with meals? Marland Kitchen Which grocery stores provide online shopping and delivery? Marland Kitchen Which restaurants offer take-out or delivery options? ______________________________________________________________________________________________________________________________________________________________________________________________________________________________________________________________________________________________________________________________________________________________________________________________________  CARE FOR MOM *It's important that mom is cared for and pampered in the postpartum period. Remember, the most important ways new mothers need care are: sleep, nutrition, gentle exercise, and time off.* . Who can come take care of mom during this period? Make a list of people with their contact information. . List some activities that make you feel cared for, rested, and energized? Who can make sure you have opportunities to do these things? . Does mom have a space of her very own within your home that's just for her? Make a "[facility]" where she can be comfortable, rest, and renew herself  daily. ______________________________________________________________________________________________________________________________________________________________________________________________________________________________________________________________________________________________________________________________________________________________________________________________________    CARE FOR AND FEEDING BABY *Knowledgeable and encouraging people will offer the best support with regard to feeding your baby.* . Educate yourself and choose the best feeding option for your baby. . Make a list of people who will guide, support, and be a resource for you as your care for and feed your baby. (Friends that have breastfed or are currently breastfeeding, lactation consultants, breastfeeding support groups, etc.) . Consider a postpartum doula. (These websites can give you information: dona.org & BuyingShow.es) . Seek out local breastfeeding resources like the breastfeeding support group at [employer] or [employer]. ______________________________________________________________________________________________________________________________________________________________________________________________________________________________________________________________________________________________________________________________________________________________________________________________________  Verner Chol AND ERRANDS . Who can help with a thorough cleaning before baby is born? . Make a list of people who will help with housekeeping and chores, like laundry, light cleaning, dishes, bathrooms, etc. . Who can run some errands for you? Marland Kitchen What can you do to make sure you are stocked with basic supplies before baby is born? . Who is going to do  shopping? ______________________________________________________________________________________________________________________________________________________________________________________________________________________________________________________________________________________________________________________________________________________________________________________________________     Family Adjustment *Nurture yourselves.it helps parents be more loving and allows for better bonding with their child.* . What sorts of things do you and partner enjoy doing together? Which activities help you to connect and strengthen your relationship? Make a list of those things. Make a list of people whom you trust to care for your baby so you can have some time together as a couple. . What types of things help partner feel connected to Mom? Make a list. . What needs will partner have in order to bond with baby? . Other children? Who will care for them when you go into labor and while you are in the hospital? . Think about what the needs of your older children might be. Who can help you meet those needs? In what ways are you helping them prepare for bringing baby home? List some specific strategies you have for family adjustment. _______________________________________________________________________________________________________________________________________________________________________________________________________________________________________________________________________________________________________________________________________________  SUPPORT *Someone who can empathize with experiences normalizes your problems and makes them more bearable.* . Make a list of other friends, neighbors, and/or co-workers you know with infants (and small children, if applicable) with whom you can connect. . Make a list of local or online support groups, mom groups, etc. in which you can be  involved. ______________________________________________________________________________________________________________________________________________________________________________________________________________________________________________________________________________________________________________________________________________________________________________________________________  Childcare Plans . Investigate and plan for childcare if mom is returning to work. . Talk about mom's concerns about her transition back to work. . Talk about partner's concerns regarding this transition.  Mental Health *Your mental health is one of the highest priorities for a pregnant or postpartum mom.* . 1 in 5 women experience anxiety and/or depression from the time of conception through the first year after birth. . Postpartum Mood Disorders are the #1 complication of pregnancy and childbirth and the suffering experienced by these mothers is not necessary! These illnesses are temporary and respond well to treatment, which often includes self-care, social support, talk therapy, and medication when needed. . Women experiencing anxiety and depression often say things like: "I'm supposed to be happy.why do I feel so sad?", "Why can't I snap out of it?", "I'm having thoughts that scare me." . There is no need to be embarrassed if you are feeling these symptoms: o Overwhelmed, anxious, angry, sad, guilty, irritable, hopeless, exhausted but can't sleep o You are NOT alone. You are NOT to blame. With help, you WILL be well. . Where can I find help? Medical professionals such as your OB, midwife, gynecologist, family practitioner, primary care provider, pediatrician, or mental health providers; [facility] support groups: Feelings After Birth, Breastfeeding Support Group, Baby and Me Group, and Fit 4 Two exercise classes. . You have permission to ask for help. It will confirm your feelings, validate your  experiences, share/learn coping strategies, and gain support and encouragement as you heal. You are important! BRAINSTORM . Make a list of local resources, including resources for mom and for partner. . Identify support groups. . Identify people to call late at night - include names and contact info. . Talk with partner about perinatal mood and anxiety disorders. . Talk with your OB, midwife, and doula about baby blues and about perinatal mood and anxiety disorders. . Talk with your pediatrician about perinatal mood and anxiety disorders.   Support & [employer]   What do you really need?  . Basics . In preparing for a new baby, many expectant parents spend hours shopping for baby clothes, decorating the nursery, and deciding which car seat to   buy. Yet most don't think much about what the reality of parenting a newborn will be like, and what they need to make it through that. So, here is the advice of experienced parents. We know you'll read this, and think "they're exaggerating, I don't really need that." Just trust us on these, OK? Plan for all of this, and if it turns out you don't need it, come back and teach us how you did it!  . Must-Haves (Once baby's survival needs are met, make sure you attend to your own survival needs!) . Sleep . An average newborn sleeps 16-18 hours per day, over 6-7 sleep periods, rarely more than three hours at a time. It is normal and healthy for a newborn to wake throughout the night... but really hard on parents!! . Naps. Prioritize sleep above any responsibilities like: cleaning house, visiting friends, running errands, etc.  Sleep whenever baby sleeps. If you can't nap, at least have restful times when baby eats. The more rest you get, the more patient you will be, the more emotionally stable, and better at solving problems.  . Food . You may not have realized it would be difficult to eat when you have a newborn. Yet, when we talk to . countless new  parents, they say things like "it may be 2:00 pm when I realize I haven't had breakfast yet." Or "every time we sit down to dinner, baby needs to eat, and my food gets cold, so I don't bother to eat it." . Finger food. Before your baby is born, stock up with one months' worth of food that: 1) you can eat with one hand while holding a baby, 2) doesn't need to be prepped, 3) is good hot or cold, 4) doesn't spoil when left out for a few hours, and 5) you like to eat. Think about: nuts, dried fruit, Clif bars, pretzels, jerky, gogurt, baby carrots, apples, bananas, crackers, cheez-n-crackers, string cheese, hot pockets or frozen burritos to microwave, garden burgers and breakfast pastries to put in the toaster, yogurt drinks, etc. . Restaurant Menus. Make lists of your favorite restaurants & menu items. When family/friends want to help, you can give specific information without much thought. They can either bring you the food or send gift cards for just the right meals. . Freezer Meals.  Take some time to make a few meals to put in the freezer ahead of time.  Easy to freeze meals can be anything such as soup, lasagna, chicken pie, or spaghetti sauce. . Set up a Meal Schedule.  Ask friends and family to sign up to bring you meals during the first few weeks of being home. (It can be passed around at baby showers!) You have no idea how helpful this will be until you are in the throes of parenting.  www.takethemameal.com is a great website to check out. . Emotional Support . Know who to call when you're stressed out. Parenting a newborn is very challenging work. There are times when it totally overwhelms your normal coping abilities. EVERY NEW PARENT NEEDS TO HAVE A PLAN FOR WHO TO CALL WHEN THEY JUST CAN'T COPE ANY MORE. (And it has to be someone other than the baby's other parent!) Before your baby is born, come up with at least one person you can call for support - write their phone number down and post it on the  refrigerator. . Anxiety & Sadness. Baby blues are normal after pregnancy; however, there are more severe types of anxiety &   sadness which can occur and should not be ignored.  They are always treatable, but you have to take the first step by reaching out for help. [facility] offers a "Mom Talk" group which meets every Tuesday from 10 am - 11 am.  This group is for new moms who need support and connection after their babies are born.  Call [phone].  . Really, Really Helpful (Plan for them! Make sure these happen often!!) . Physical Support with Taking Care of Yourselves . Asking friends and family. Before your baby is born, set up a schedule of people who can come and visit and help out (or ask a friend to schedule for you). Any time someone says "let me know what I can do to help," sign them up for a day. When they get there, their job is not to take care of the baby (that's your job and your joy). Their job is to take care of you!  . Postpartum doulas. If you don't have anyone you can call on for support, look into postpartum doulas:  professionals at helping parents with caring for baby, caring for themselves, getting breastfeeding started, and helping with household tasks. www.padanc.org is a helpful website for learning about doulas in our area. . Peer Support / Parent Groups . Why: One of the greatest ideas for new parents is to be around other new parents. Parent groups give you a chance to share and listen to others who are going through the same season of life, get a sense of what is normal infant development by watching several babies learn and grow, share your stories of triumph and struggles with empathetic ears, and forgive your own mistakes when you realize all parents are learning by trial and error. . Where to find: There are many places you can meet other new parents throughout our community.  [facility] offers the following classes for new moms and their little ones:  Baby  and Me (Birth to Crawling) and Breastfeeding Support Group. Go to www.conehealthybaby.com or call [phone] for more information. . Time for your Relationship . It's easy to get so caught up in meeting baby's immediate needs that it's hard to find time to connect with your partner, and meet the needs of your relationship. It's also easy to forget what "quality time with your partner" actually looks like. If you take your baby on a date, you'd be amazed how much of your couple time is spent feeding the baby, diapering the baby, admiring the baby, and talking about the baby. . Dating: Try to take time for just the two of you. Babysitter tip: Sometimes when moms are breastfeeding a newborn, they find it hard to figure out how to schedule outings around baby's unpredictable feeding schedules. Have the babysitter come for a three hour period. When she comes over, if baby has just eaten, you can leave right away, and come back in two hours. If baby hasn't fed recently, you start the date at home. Once baby gets hungry and gets a good feeding in, you can head out for the rest of your date time. . Date Nights at Home: If you can't get out, at least set aside one evening a week to prioritize your relationship: whenever baby dozes off or doesn't have any immediate needs, spend a little time focusing on each other. . Potential conflicts: The main relationship conflicts that come up for new parents are: issues related to sexuality, financial stresses, a feeling of an unfair division   of household tasks, and conflicts in parenting styles. The more you can work on these issues before baby arrives, the better!  . Fun and Frills (Don't forget these. and don't feel guilty for indulging in them!) . Everyone has something in life that is a fun little treat that they do just for themselves. It may be: reading the morning paper, or going for a daily jog, or having coffee with a friend once a week, or going to a movie on Friday  nights, or fine chocolates, or bubble baths, or curling up with a good book. . Unless you do fun things for yourself every now and then, it's hard to have the energy for fun with your baby. Whatever your "special" treats are, make sure you find a way to continue to indulge in them after your baby is born. These special moments can recharge you, and allow you to return to baby with a new joy   PERINATAL MOOD DISORDERS: MATERNAL MENTAL HEALTH FROM CONCEPTION THROUGH THE POSTPARTUM PERIOD   Emergency and Crisis Resources:  If you are an imminent risk to self or others, are experiencing intense personal distress, and/or have noticed significant changes in activities of daily living, call:  . 911 . [facility]: [phone] . Mobile Crisis: 877-626-1772 . National Suicide Hotline: 1-800-273-8255 Or visit the following crisis centers: . Local Emergency Departments . [facility]: [address], [place] [phone]. Hours: 8:30AM-5PM. Insurance Accepted: Medicaid, Medicare, and Uninsured.  . [facility]  [address], [place] Mon-Friday 8am-3pm  [phone]                                                                                    Non-Crisis Resources: To identify specific providers that are covered by your insurance, contact your insurance company or local agencies: [place]: 1-800-256-2452 [facility]: 888-581-9988 [employer]: 1-800-939-5911 Postpartum Support [employer]- [place] 1-800-944-4773                                                      Outpatient therapy and medication management providers:  [facility] [phone] Hours: 9AM-5PM  Insurance Accepted: AARP, Aetna, BCBS, Cigna, [employer], Humana, Medicare  [facility] [phone] Hours: 8AM-5PM  nsurance Accepted: All insurances EXCEPT AARP, Aetna, [employer], and  [employer] [facility]: [phone]             Hours: 8AM-8PM Insurance Accepted: Aetna, BCBS, Cigna, [employer], Medicaid, Medicare, Uninsured [facility]: [phone] [facility]: [phone] Hours: 8:30AM-7PM Insurance Accepted: Aetna, BCBS, Medicaid, Medicare, Tricare, [facility]:  [phone]              Hours:9AM-5PM Insurance Accepted:  Aetna, BCBS, [facility], Medicaid, United Health Care  [facility] [phone] Hours: 9AM-5:30PM Insurance Accepted: AARP, Aetna, BCBS, Cigna, and Medicaid, Medicare, United Health Care [facility]:  [phone] Hours: 9am-5pm Insurance Accepted: BCBS; they do not accept Medicaid/Medicare   [facility]: [phone] Hours: 9am-9pm Insurance Accepted: All major insurance including Medicaid and Medicare [facility]: [phone] Hours: 9AM-5:30PM Insurance Accepted: All insurances EXCEPT Medicaid and Medicare. [facility]: [phone]                                                                       Parenting Support Groups [facility]: [phone] [facility]:  [phone] Family Support Network (support for children in the NICU and/or with special needs), [phone]                                                                   Mental Health Support Groups Mental [employer] Association: 336-373-1402                                                                                     Online Resources: Postpartum Support International: http://www.postpartum.net/  800-944-4PPD 2Moms Supporting Moms:  www.momssupportingmoms.net     

## 2019-07-06 NOTE — Telephone Encounter (Signed)
Preadmission screen  

## 2019-07-08 ENCOUNTER — Other Ambulatory Visit (HOSPITAL_COMMUNITY)
Admission: RE | Admit: 2019-07-08 | Discharge: 2019-07-08 | Disposition: A | Discharge status: Home / Self Care | Payer: Medicaid Other | Source: Ambulatory Visit | Attending: Family Medicine | Admitting: Family Medicine

## 2019-07-08 DIAGNOSIS — Z01812 Encounter for preprocedural laboratory examination: Secondary | ICD-10-CM | POA: Diagnosis present

## 2019-07-08 DIAGNOSIS — U071 COVID-19: Secondary | ICD-10-CM | POA: Diagnosis not present

## 2019-07-08 LAB — SARS CORONAVIRUS 2 (TAT 6-24 HRS): SARS Coronavirus 2: POSITIVE — AB

## 2019-07-08 NOTE — BH Specialist Note (Signed)
Integrated Behavioral Health via Telemedicine Phone Visit  07/08/2019 Ellesyn Droke [id number]  Number of [place] visits: 4 Session Start time: 10:17  Session End time: 10:38 Total time: 21  Referring Provider: Emeterio Reeve, MD Type of Visit: Phone Patient/Family location: Home [facility] Provider location: [facility] [place] [facility]  All persons participating in visit: Patient Alice Castillo and O'[place]    Confirmed patient's address: Yes  Confirmed patient's phone number: Yes  Any changes to demographics: No   Confirmed patient's insurance: Yes  Any changes to patient's insurance: No   Discussed confidentiality: at previous visit  I connected with [name]  by a video enabled telemedicine application and verified that I am speaking with the correct person using two identifiers.     I discussed the limitations of evaluation and management by telemedicine and the availability of in person appointments.  I discussed that the purpose of this visit is to provide behavioral health care while limiting exposure to the novel coronavirus.   Discussed there is a possibility of technology failure and discussed alternative modes of communication if that failure occurs.  I discussed that engaging in this video visit, they consent to the provision of behavioral healthcare and the services will be billed under their insurance.  Patient and/or legal guardian expressed understanding and consented to video visit: Yes   PRESENTING CONCERNS: Patient and/or family reports the following symptoms/concerns: Pt states she has not had hallucinations postpartum, as she had with previous pregnancy, is not taking any BH medication, is getting help at home; primary concern is fatigue and lack of quality sleep with baby's schedule.  Duration of problem: Postpartum; Severity of problem: moderate  STRENGTHS (Protective Factors/Coping  Skills): Supportive FOB  GOALS ADDRESSED: Patient will: 1.  Reduce symptoms of: anxiety, depression, insomnia and stress  2.  Demonstrate ability to: Increase healthy adjustment to current life circumstances and Increase motivation to adhere to plan of care  INTERVENTIONS: Interventions utilized:  Supportive Counseling Standardized Assessments completed: not given today  ASSESSMENT: Patient currently experiencing Post-traumatic stress disorder .   Patient may benefit from continued psychoeducation and brief therapeutic interventions regarding coping with symptoms of anxiety, depression, and current life stress .  PLAN: 1. Follow up with behavioral health clinician on : Two weeks 2. Behavioral recommendations:  -If symptoms increase prior to visit with [name], call her at [phone] -Prioritize healthy sleep, as much as possible, daily for the next two weeks -Continue taking prenatal vitamin until postpartum medical visit -Continue to consider working with [facility]  -Make sure VM is set up and not full to receive call-back from psychiatry  3. Referral(s): [place] (In Clinic)  I discussed the assessment and treatment plan with the patient and/or parent/guardian. They were provided an opportunity to ask questions and all were answered. They agreed with the plan and demonstrated an understanding of the instructions.   They were advised to call back or seek an in-person evaluation if the symptoms worsen or if the condition fails to improve as anticipated.  [name] Encarnacion Bole

## 2019-07-09 ENCOUNTER — Other Ambulatory Visit: Payer: BC Managed Care – PPO

## 2019-07-09 ENCOUNTER — Encounter: Payer: BC Managed Care – PPO | Admitting: Family Medicine

## 2019-07-09 ENCOUNTER — Telehealth: Payer: Self-pay | Admitting: Family Medicine

## 2019-07-09 NOTE — Progress Notes (Signed)
Patient tested positive for COVID on 07/08/19.  IB message sent to Dr. Kennon Rounds.  WiIll contact the office when they open this morning.

## 2019-07-09 NOTE — Telephone Encounter (Signed)
Informed pt. Of + COVID results, implications for time on L and D and postpartum, visitor limitations, etc.

## 2019-07-10 ENCOUNTER — Inpatient Hospital Stay (HOSPITAL_COMMUNITY)
Admission: AD | Admit: 2019-07-10 | Discharge: 2019-07-12 | DRG: 805 | Disposition: A | Discharge status: Home / Self Care | Payer: Medicaid Other | Attending: Family Medicine | Admitting: Family Medicine

## 2019-07-10 ENCOUNTER — Other Ambulatory Visit: Payer: Self-pay

## 2019-07-10 ENCOUNTER — Inpatient Hospital Stay (HOSPITAL_COMMUNITY): Payer: Medicaid Other

## 2019-07-10 ENCOUNTER — Encounter (HOSPITAL_COMMUNITY): Payer: Self-pay | Admitting: Obstetrics and Gynecology

## 2019-07-10 DIAGNOSIS — Z2839 Other underimmunization status: Secondary | ICD-10-CM

## 2019-07-10 DIAGNOSIS — Z6791 Unspecified blood type, Rh negative: Secondary | ICD-10-CM

## 2019-07-10 DIAGNOSIS — F333 Major depressive disorder, recurrent, severe with psychotic symptoms: Secondary | ICD-10-CM | POA: Diagnosis present

## 2019-07-10 DIAGNOSIS — Z283 Underimmunization status: Secondary | ICD-10-CM

## 2019-07-10 DIAGNOSIS — N907 Vulvar cyst: Secondary | ICD-10-CM | POA: Diagnosis present

## 2019-07-10 DIAGNOSIS — Z7722 Contact with and (suspected) exposure to environmental tobacco smoke (acute) (chronic): Secondary | ICD-10-CM | POA: Diagnosis present

## 2019-07-10 DIAGNOSIS — O26899 Other specified pregnancy related conditions, unspecified trimester: Secondary | ICD-10-CM

## 2019-07-10 DIAGNOSIS — O9912 Other diseases of the blood and blood-forming organs and certain disorders involving the immune mechanism complicating childbirth: Secondary | ICD-10-CM | POA: Diagnosis present

## 2019-07-10 DIAGNOSIS — O99340 Other mental disorders complicating pregnancy, unspecified trimester: Secondary | ICD-10-CM | POA: Diagnosis present

## 2019-07-10 DIAGNOSIS — Z3A40 40 weeks gestation of pregnancy: Secondary | ICD-10-CM | POA: Diagnosis not present

## 2019-07-10 DIAGNOSIS — O26893 Other specified pregnancy related conditions, third trimester: Secondary | ICD-10-CM | POA: Diagnosis present

## 2019-07-10 DIAGNOSIS — Z8489 Family history of other specified conditions: Secondary | ICD-10-CM

## 2019-07-10 DIAGNOSIS — F419 Anxiety disorder, unspecified: Secondary | ICD-10-CM | POA: Diagnosis present

## 2019-07-10 DIAGNOSIS — U071 COVID-19: Secondary | ICD-10-CM | POA: Diagnosis present

## 2019-07-10 DIAGNOSIS — O480 Post-term pregnancy: Principal | ICD-10-CM | POA: Diagnosis present

## 2019-07-10 DIAGNOSIS — O09899 Supervision of other high risk pregnancies, unspecified trimester: Secondary | ICD-10-CM

## 2019-07-10 DIAGNOSIS — O9852 Other viral diseases complicating childbirth: Secondary | ICD-10-CM | POA: Diagnosis present

## 2019-07-10 DIAGNOSIS — Z832 Family history of diseases of the blood and blood-forming organs and certain disorders involving the immune mechanism: Secondary | ICD-10-CM

## 2019-07-10 DIAGNOSIS — D681 Hereditary factor XI deficiency: Secondary | ICD-10-CM | POA: Diagnosis present

## 2019-07-10 DIAGNOSIS — F4310 Post-traumatic stress disorder, unspecified: Secondary | ICD-10-CM | POA: Diagnosis present

## 2019-07-10 LAB — CBC
HCT: 42 % (ref 36.0–46.0)
Hemoglobin: 13.5 g/dL (ref 12.0–15.0)
MCH: 31.1 pg (ref 26.0–34.0)
MCHC: 32.1 g/dL (ref 30.0–36.0)
MCV: 96.8 fL (ref 80.0–100.0)
Platelets: 209 10*3/uL (ref 150–400)
RBC: 4.34 MIL/uL (ref 3.87–5.11)
RDW: 12.9 % (ref 11.5–15.5)
WBC: 14.1 10*3/uL — ABNORMAL HIGH (ref 4.0–10.5)
nRBC: 0 % (ref 0.0–0.2)

## 2019-07-10 LAB — TYPE AND SCREEN
ABO/RH(D): O NEG
Antibody Screen: NEGATIVE

## 2019-07-10 LAB — PROTIME-INR
INR: 1.2 (ref 0.8–1.2)
Prothrombin Time: 14.3 seconds (ref 11.4–15.2)

## 2019-07-10 LAB — RPR: RPR Ser Ql: NONREACTIVE

## 2019-07-10 LAB — FIBRINOGEN: Fibrinogen: 587 mg/dL — ABNORMAL HIGH (ref 210–475)

## 2019-07-10 LAB — APTT: aPTT: 34 seconds (ref 24–36)

## 2019-07-10 MED ORDER — AMMONIA AROMATIC IN INHA
RESPIRATORY_TRACT | Status: AC
  Filled 2019-07-10: qty 10

## 2019-07-10 MED ORDER — ONDANSETRON HCL 4 MG PO TABS: 4.0000 mg | ORAL_TABLET | ORAL | Status: DC | PRN

## 2019-07-10 MED ORDER — ONDANSETRON HCL 4 MG/2ML IJ SOLN: 4.0000 mg | INTRAMUSCULAR | Status: DC | PRN

## 2019-07-10 MED ORDER — FENTANYL CITRATE (PF) 100 MCG/2ML IJ SOLN
50.0000 ug | INTRAMUSCULAR | Status: DC | PRN
  Filled 2019-07-10: qty 2

## 2019-07-10 MED ORDER — BUTORPHANOL TARTRATE 1 MG/ML IJ SOLN: 1.0000 mg | Freq: Once | INTRAMUSCULAR | Status: DC

## 2019-07-10 MED ORDER — BENZOCAINE-MENTHOL 20-0.5 % EX AERO
1.0000 "application " | INHALATION_SPRAY | CUTANEOUS | Status: DC | PRN
  Administered 2019-07-10: 1 via TOPICAL
  Filled 2019-07-10: qty 56

## 2019-07-10 MED ORDER — FENTANYL CITRATE (PF) 100 MCG/2ML IJ SOLN
100.0000 ug | INTRAMUSCULAR | Status: AC | PRN
  Administered 2019-07-10 (×2): 100 ug via INTRAVENOUS
  Filled 2019-07-10 (×2): qty 2

## 2019-07-10 MED ORDER — LIDOCAINE HCL (PF) 1 % IJ SOLN
30.0000 mL | INTRAMUSCULAR | Status: AC | PRN
  Administered 2019-07-10: 30 mL via SUBCUTANEOUS
  Filled 2019-07-10: qty 30

## 2019-07-10 MED ORDER — MISOPROSTOL 25 MCG QUARTER TABLET: 25.0000 ug | ORAL_TABLET | ORAL | Status: DC | PRN

## 2019-07-10 MED ORDER — ONDANSETRON HCL 4 MG/2ML IJ SOLN
4.0000 mg | Freq: Four times a day (QID) | INTRAMUSCULAR | Status: DC | PRN
  Administered 2019-07-10: 4 mg via INTRAVENOUS
  Filled 2019-07-10: qty 2

## 2019-07-10 MED ORDER — ACETAMINOPHEN 325 MG PO TABS
650.0000 mg | ORAL_TABLET | ORAL | Status: DC | PRN
  Administered 2019-07-11 – 2019-07-12 (×3): 650 mg via ORAL
  Filled 2019-07-10 (×4): qty 2

## 2019-07-10 MED ORDER — WITCH HAZEL-GLYCERIN EX PADS: 1.0000 "application " | MEDICATED_PAD | CUTANEOUS | Status: DC | PRN

## 2019-07-10 MED ORDER — TERBUTALINE SULFATE 1 MG/ML IJ SOLN: 0.2500 mg | Freq: Once | INTRAMUSCULAR | Status: DC | PRN

## 2019-07-10 MED ORDER — SOD CITRATE-CITRIC ACID 500-334 MG/5ML PO SOLN: 30.0000 mL | ORAL | Status: DC | PRN

## 2019-07-10 MED ORDER — LACTATED RINGERS IV SOLN: 500.0000 mL | INTRAVENOUS | Status: DC | PRN

## 2019-07-10 MED ORDER — SIMETHICONE 80 MG PO CHEW: 80.0000 mg | CHEWABLE_TABLET | ORAL | Status: DC | PRN

## 2019-07-10 MED ORDER — DIBUCAINE (PERIANAL) 1 % EX OINT: 1.0000 "application " | TOPICAL_OINTMENT | CUTANEOUS | Status: DC | PRN

## 2019-07-10 MED ORDER — FENTANYL CITRATE (PF) 100 MCG/2ML IJ SOLN
50.0000 ug | INTRAMUSCULAR | Status: DC | PRN
  Administered 2019-07-10: 100 ug via INTRAVENOUS

## 2019-07-10 MED ORDER — PRENATAL MULTIVITAMIN CH
1.0000 | ORAL_TABLET | Freq: Every day | ORAL | Status: DC
  Administered 2019-07-11: 1 via ORAL
  Filled 2019-07-10: qty 1

## 2019-07-10 MED ORDER — OXYTOCIN BOLUS FROM INFUSION
500.0000 mL | Freq: Once | INTRAVENOUS | Status: AC
  Administered 2019-07-10: 500 mL via INTRAVENOUS

## 2019-07-10 MED ORDER — ZOLPIDEM TARTRATE 5 MG PO TABS: 5.0000 mg | ORAL_TABLET | Freq: Every evening | ORAL | Status: DC | PRN

## 2019-07-10 MED ORDER — PROMETHAZINE HCL 25 MG/ML IJ SOLN: 12.5000 mg | Freq: Four times a day (QID) | INTRAMUSCULAR | Status: DC | PRN

## 2019-07-10 MED ORDER — SODIUM CHLORIDE 0.9% IV SOLUTION: Freq: Once | INTRAVENOUS | Status: DC

## 2019-07-10 MED ORDER — TETANUS-DIPHTH-ACELL PERTUSSIS 5-2.5-18.5 LF-MCG/0.5 IM SUSP: 0.5000 mL | Freq: Once | INTRAMUSCULAR | Status: DC

## 2019-07-10 MED ORDER — OXYCODONE HCL 5 MG PO TABS
5.0000 mg | ORAL_TABLET | ORAL | Status: DC | PRN
  Filled 2019-07-10: qty 1

## 2019-07-10 MED ORDER — COCONUT OIL OIL: 1.0000 "application " | TOPICAL_OIL | Status: DC | PRN

## 2019-07-10 MED ORDER — SENNOSIDES-DOCUSATE SODIUM 8.6-50 MG PO TABS
2.0000 | ORAL_TABLET | ORAL | Status: DC
  Administered 2019-07-10 – 2019-07-12 (×2): 2 via ORAL
  Filled 2019-07-10 (×2): qty 2

## 2019-07-10 MED ORDER — ACETAMINOPHEN 325 MG PO TABS: 650.0000 mg | ORAL_TABLET | ORAL | Status: DC | PRN

## 2019-07-10 MED ORDER — MEASLES, MUMPS & RUBELLA VAC IJ SOLR: 0.5000 mL | Freq: Once | INTRAMUSCULAR | Status: DC

## 2019-07-10 MED ORDER — LACTATED RINGERS IV SOLN: INTRAVENOUS | Status: DC

## 2019-07-10 MED ORDER — DIPHENHYDRAMINE HCL 25 MG PO CAPS: 25.0000 mg | ORAL_CAPSULE | Freq: Four times a day (QID) | ORAL | Status: DC | PRN

## 2019-07-10 MED ORDER — IBUPROFEN 600 MG PO TABS
600.0000 mg | ORAL_TABLET | Freq: Four times a day (QID) | ORAL | Status: DC
  Administered 2019-07-10 – 2019-07-12 (×6): 600 mg via ORAL
  Filled 2019-07-10 (×6): qty 1

## 2019-07-10 MED ORDER — OXYTOCIN 40 UNITS IN NORMAL SALINE INFUSION - SIMPLE MED
2.5000 [IU]/h | INTRAVENOUS | Status: DC
  Filled 2019-07-10: qty 1000

## 2019-07-10 NOTE — Discharge Summary (Signed)
Postpartum Discharge Summary  Date of Service updated 07/12/19     Patient Name: Alice Castillo DOB: August 09, 1995 MRN: [id number]  Date of admission: 07/10/2019 Delivery date:07/10/2019  Delivering provider: Serita Grammes D  Date of discharge: 07/12/2019  Admitting diagnosis: Post-dates pregnancy [O48.0] Intrauterine pregnancy: [redacted]w[redacted]d    Secondary diagnosis:  Active Problems:   Major depressive disorder, recurrent, severe with psychotic features ([place]   PTSD (post-traumatic stress disorder)   Anxiety during pregnancy, antepartum   Rh negative state in antepartum period   Family history of clotting disorder   Family history of genetic disorder   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 yo [id number] admitted to the hospital 07/10/2019 for induction of labor.  Indication for induction: Postdates. Patient had an uncomplicated labor course as follows: she had a cervical foley inserted the morning of her induction, then SROM around the time it came out, prior to progressing to complete and SVD.  Membrane Rupture Time/Date: 12:40 PM ,07/10/2019   Delivery Method:Vaginal, Spontaneous  Episiotomy: None  Lacerations:  2nd degree;Perineal 2nd degree perineal Details of delivery can be found in separate delivery note.  Patient had a routine postpartum course. Patient is discharged home 07/12/19.  Newborn Data: Birth date:07/10/2019  Birth time:3:49 PM  Gender:Female  Living status:Living  Apgars:8 ,9   Weight: 4010gm (8lb 13.4oz)  Magnesium Sulfate received: No BMZ received:  No Rhophylac:Yes MMR:Yes T-DaP:Given prenatally Flu: No Transfusion:No  Physical exam  Vitals:   07/11/19 0830 07/11/19 1755 07/11/19 2100 07/12/19 0617  BP: (!) 88/60 104/77 98/67 105/68  Pulse: 73 79 68 67  Resp: 16  17   Temp: 97.6 F (36.4 C) 97.8 F (36.6 C) 97.7 F (36.5 C) 97.7 F (36.5 C)  TempSrc: Oral  Oral Oral  SpO2: 98% 100%    Weight:      Height:       General: alert, cooperative and no distress Lochia: appropriate Uterine Fundus: firm Incision: N/A DVT Evaluation: No evidence of DVT seen on physical exam. Labs: Lab Results  Component Value Date   WBC 14.1 (H) 07/10/2019   HGB 13.5 07/10/2019   HCT 42.0 07/10/2019   MCV 96.8 07/10/2019   PLT 209 07/10/2019   CMP Latest Ref Rng & Units 08/09/2015  Glucose 70 - 140 mg/dl 83  BUN 7.0 - 26.0 mg/dL 9.1  Creatinine 0.6 - 1.1 mg/dL 0.7  Sodium 136 - 145 mEq/L 142  Potassium 3.5 - 5.1 mEq/L 3.8  Chloride 101 - 111 mmol/L -  CO2 22 - 29 mEq/L 26  Calcium 8.4 - 10.4 mg/dL 9.6  Total Protein 6.4 - 8.3 g/dL 7.7  Total Bilirubin 0.20 - 1.20 mg/dL 0.34  Alkaline Phos 40 - 150 U/L 114  AST 5 - 34 U/L 23  ALT 0 - 55 U/L 41   Edinburgh Score: Edinburgh Postnatal Depression Scale Screening Tool 07/10/2019  I have been  able to laugh and see the funny side of things. 1  I have looked forward with enjoyment to things. 2  I have blamed myself unnecessarily when things went wrong. 3  I have been anxious or worried for no good reason. 2  I have felt scared or panicky for no good reason. 2  Things have been getting on top of me. 2  I have been so unhappy that I have had difficulty sleeping. 3  I have felt sad or miserable. 2  I have been so unhappy that I have been crying. 1  The thought of harming myself has occurred to me. 0  Edinburgh Postnatal Depression Scale Total 18     After visit meds:  Allergies as of 07/12/2019      Reactions   Food    Bananas-throat swells/shortness of breath   Lactose  Intolerance (gi) Other (See Comments)   Stomach irritation, and diarhea      Medication List    TAKE these medications   ibuprofen 600 MG tablet Commonly known as: ADVIL Take 1 tablet (600 mg total) by mouth every 6 (six) hours.   prenatal multivitamin Tabs tablet Take 1 tablet by mouth daily at 12 noon.        Discharge home in stable condition Infant Feeding: Breast Infant Disposition:home with mother Discharge instruction: per After Visit Summary and Postpartum booklet. Activity: Advance as tolerated. Pelvic rest for 6 weeks.  Diet: routine diet Future Appointments: Future Appointments  Date Time Provider [place]  07/17/2019 10:15 AM [facility]   Follow up Visit: [place] [facility] [place] [facility] Follow up.   Specialty: Obstetrics and Gynecology Why: In 4-6 weeks for postpartum visit Contact information: [place] [address] [phone]          Please schedule this patient for Postpartum visit in: 4 weeks with the following provider: Any provider Pt preference for office/virtual visit For C/S patients schedule nurse incision check in weeks 2 weeks: no Low risk pregnancy complicated by: postdates; Factor XI deficiency; Rh neg; rubella nonimmune Delivery mode:  SVD Anticipated Birth Control:   PP Depo given PP Procedures needed: Pap  Schedule Integrated BH visit: no   07/12/2019 Fatima Blank, CNM

## 2019-07-10 NOTE — Progress Notes (Signed)
Labor Progress Note Alice Castillo is a 24 y.o. [id number] at [redacted]w[redacted]d presented for IOL for post dates.   S: Patient uncomfortable with CTXs.   O:  BP 108/69   Pulse 100   Temp 99.4 F (37.4 C) (Axillary)   Resp 18   Ht 5\' 6"  (1.676 m)   Wt 73 kg   SpO2 100%   BMI 25.99 kg/m  EFM: 160/moderate variability / pos accels, no decels  TOCO: every 1-2 minutes   CVE: Dilation: 6.5 Effacement (%): 90 Cervical Position: Middle Station: 0 Presentation: Vertex Exam by:: Dr. Smith Mince   A&P: 24 y.o. [id number] [redacted]w[redacted]d admitted for IOL for postdates.  #Labor: Progressing well s/p foley bulb.  Expectant management. Patient pushing between contractions though her cervix is dilated 6.5 cm.  #Pain: will give Stadol and Phenergan  #FWB: Cat 1 #GBS negative    Alice Brimage, DO 3:47 PM

## 2019-07-10 NOTE — H&P (Addendum)
OBSTETRIC ADMISSION HISTORY AND PHYSICAL  Alice Castillo is a 24 y.o. female [phone] with IUP at [redacted]w[redacted]d by clinical EDD presenting for IOL for post dates. She reports+FMs, No LOF, no VB, no blurry vision, headaches or peripheral edema, and RUQ pain.  Denies sore throat, fever, myalgias, arthralgias and shortness of breath. Reports diarrhea and nausea. Son is at home with cough and congestion. She plans on breast feeding. She request Depo for birth control.  She received her prenatal care at [facility].   Dating: By clinical EDD --->  Estimated Date of Delivery: 07/04/19  Sono:    @[redacted]w[redacted]d , CWD, normal anatomy, cephalic presentation, EFW: 1603gm (3 lb 9 oz), 38 %lie   Prenatal History/Complications:  Late prenatal care (iniated at 28 weeks) Heterozygous for Factor XI deficiency  Positive antibody screen  Rubella non-immune  MDD (no meds)   Past Medical History: Past Medical History:  Diagnosis Date  . Anxiety   . Bipolar 1 disorder ([place])   . Chlamydia infection   . Depression   . 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 Headache     Past Surgical History: Past Surgical History:  Procedure Laterality Date  . NO PAST SURGERIES      Obstetrical History: OB History    Gravida  4   Para  1   Term  1   Preterm      AB  2   Living  1     SAB      TAB  2   Ectopic      Multiple  0   Live Births  1           Social History Social History   Socioeconomic History  . Marital status: Single    Spouse name: Not on file  . Number of children: Not on file  . Years of education: Not on file  . Highest education level: Not on file  Occupational History  . Not on file  Tobacco Use  . Smoking status: Passive Smoke Exposure - Never Smoker  . Smokeless tobacco: Never Used  Substance and Sexual Activity  . Alcohol use: Not Currently    Alcohol/week: 1.0 standard drinks    Types: 1 Shots of liquor per week    Comment: stopped when found out pregnant  . Drug use: No  . Sexual  activity: Yes    Birth control/protection: None  Other Topics Concern  . Not on file  Social History Narrative  . Not on file   Social Determinants of Health   Financial Resource Strain:   . Difficulty of Paying Living Expenses:   Food Insecurity: Food Insecurity Present  . Worried About Charity fundraiser in the Last Year: Never true  . Ran Out of Food in the Last Year: Sometimes true  Transportation Needs: Unmet Transportation Needs  . Lack of Transportation (Medical): No  . Lack of Transportation (Non-Medical): Yes  Physical Activity:   . Days of Exercise per Week:   . Minutes of Exercise per Session:   Stress:   . Feeling of Stress :   Social Connections:   . Frequency of Communication with Friends and Family:   . Frequency of Social Gatherings with Friends and Family:   . Attends Religious Services:   . Active Member of Clubs or Organizations:   . Attends Archivist Meetings:   Marland Kitchen Marital Status:     Family History: Family History  Problem Relation Age of Onset  .  Clotting disorder Father        Factor XI deficiency (affected or carrier?)  . Heart disease Maternal Uncle   . Diabetes Maternal Uncle   . Stroke Maternal Uncle   . Heart disease Paternal Uncle   . Diabetes Paternal Uncle   . Clotting disorder Paternal Uncle        Factor XI (82) deficiency  . Clotting disorder Paternal Uncle        Factor XI deficiency  . Diabetes Maternal Grandmother   . Heart disease Maternal Grandmother   . Stroke Maternal Grandmother   . Diabetes Maternal Grandfather   . Heart disease Maternal Grandfather   . Stroke Maternal Grandfather     Allergies: Allergies  Allergen Reactions  . Food     Bananas-throat swells/shortness of breath  . Lactose Intolerance (Gi) Other (See Comments)    Stomach irritation, and diarhea    Medications Prior to Admission  Medication Sig Dispense Refill Last Dose  . Prenatal Vit-Fe Fumarate-FA (PRENATAL MULTIVITAMIN) TABS tablet  Take 1 tablet by mouth daily at 12 noon.        Review of Systems   All systems reviewed and negative except as stated in HPI  Temperature 99.1 F (37.3 C), temperature source Oral, currently breastfeeding. General appearance: alert, cooperative and no distress Lungs: clear to auscultation bilaterally Heart: regular rate and rhythm Abdomen: soft, non-tender; bowel sounds normal Pelvic: gravid uterus  Extremities: Homans sign is negative, no sign of DVT Presentation: cephalic per RN CVE exam  Fetal monitoringBaseline: 170 bpm, Variability: Good {> 6 bpm), Accelerations: Reactive and Decelerations: Variables Uterine activityFrequency: Every 2-3 minutes Dilation: 1.5 Effacement (%): 50 Station: -3, -2 Exam by:: Delorise Shiner, RN   Prenatal labs: ABO, Rh: --/--/PENDING (05/14 0730) Antibody: PENDING (05/14 0730) Rubella: <0.90 (02/15 1414) RPR: Non Reactive (02/19 0900)  HBsAg: Negative (02/15 1414)  HIV: Non Reactive (02/19 0900)  GBS: Negative/-- (04/14 1506)  2 hr Glucola (79, 151, 100) Genetic screening   Anatomy US normal  Prenatal Transfer Tool  Maternal Diabetes: No Genetic Screening: Abnormal:  Results: Other: Maternal Ultrasounds/Referrals: Normal Fetal Ultrasounds or other Referrals:  None Maternal Substance Abuse:  No Significant Maternal Medications:  None Significant Maternal Lab Results: Group B Strep negative  Results for orders placed or performed during the hospital encounter of 07/10/19 (from the past 24 hour(s))  CBC   Collection Time: 07/10/19  7:30 AM  Result Value Ref Range   WBC 14.1 (H) 4.0 - 10.5 K/uL   RBC 4.34 3.87 - 5.11 MIL/uL   Hemoglobin 13.5 12.0 - 15.0 g/dL   HCT 42.0 36.0 - 46.0 %   MCV 96.8 80.0 - 100.0 fL   MCH 31.1 26.0 - 34.0 pg   MCHC 32.1 30.0 - 36.0 g/dL   RDW 12.9 11.5 - 15.5 %   Platelets 209 150 - 400 K/uL   nRBC 0.0 0.0 - 0.2 %  Type and screen   Collection Time: 07/10/19  7:30 AM  Result Value Ref Range   ABO/RH(D)  PENDING    Antibody Screen PENDING    Sample Expiration      07/13/2019,2359 Performed at [facility], [address], [place], [place] [address]     Patient Active Problem List   Diagnosis Date Noted  . Post-dates pregnancy 07/10/2019  . Rubella non-immune status, antepartum 05/15/2019  . Supervision of high risk pregnancy, antepartum 04/13/2019  . 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 Family history of  genetic disorder 04/07/2015  . Rh negative state in antepartum period 03/22/2015  . Family history of clotting disorder 03/22/2015  . Paresthesias in antepartum period in third trimester 03/22/2015  . Anxiety during pregnancy, antepartum 01/24/2015  . PTSD (post-traumatic stress disorder) 09/22/2014  . Major depressive disorder, recurrent, severe with psychotic features ([place]) 09/18/2014  . Hallucinations     Assessment/Plan:  Alice Castillo is a 24 y.o. [id number] at [redacted]w[redacted]d here for IOL for postdates.   #Labor: Admit to L&D. Discussed induction methods, risks and benefits with patient agreeable. Pelvis proven to 3900g and patient had a 3a perineal laceration.  #Pain: IV pain meds only  (No nitrogen gas as patient is COVID+, no epidural as patient with Factor XI def.) #FWB: Cat 2, reassuring for moderate variability  #ID:  None, GBS negative  #MOF: breast #MOC:Depo #Circ:  yes  #COVID: Has nausea and diarrhera.  Afebrile. Maintain oxygen saturation >95%.   Lyndee Hensen, DO  07/10/2019, 8:28 AM   CNM attestation:  I have seen and examined this patient; I agree with above documentation in the resident's note.   Alice Castillo is a 24 y.o. [phone] here for PDIOL  PE: BP 108/69   Pulse 100   Temp 99.4 F (37.4 C) (Axillary)   Resp 18   Ht 5\' 6"  (1.676 m)   Wt 73 kg   SpO2 100%   BMI 25.99 kg/m  Gen: calm comfortable, NAD Resp: normal effort, no distress Abd: gravid  ROS, labs, PMH reviewed  Plan: Admit to Labor and Delivery Plan cx ripening to start  with foley balloon, then Pit/AROM prn Anticipate vag del  [place] 07/10/2019, 3:43 PM

## 2019-07-11 LAB — PREPARE FRESH FROZEN PLASMA

## 2019-07-11 LAB — BPAM FFP
Blood Product Expiration Date: 202105162359
Blood Product Expiration Date: 202105162359
ISSUE DATE / TIME: 202105110854
ISSUE DATE / TIME: 202105111633
Unit Type and Rh: 7300
Unit Type and Rh: 7300

## 2019-07-11 MED ORDER — MEDROXYPROGESTERONE ACETATE 150 MG/ML IM SUSP
150.0000 mg | Freq: Once | INTRAMUSCULAR | Status: AC
  Administered 2019-07-12: 150 mg via INTRAMUSCULAR
  Filled 2019-07-11: qty 1

## 2019-07-11 MED ORDER — RHO D IMMUNE GLOBULIN 1500 UNIT/2ML IJ SOSY
300.0000 ug | PREFILLED_SYRINGE | Freq: Once | INTRAMUSCULAR | Status: AC
  Administered 2019-07-11: 300 ug via INTRAVENOUS
  Filled 2019-07-11: qty 2

## 2019-07-11 NOTE — Progress Notes (Signed)
Post Partum Day 1 S/p SVD 07/10/2019 at 1549  Subjective: no complaints, up ad lib, voiding, tolerating PO and + flatus  Objective: Blood pressure (!) 88/60, pulse 73, temperature 97.6 F (36.4 C), temperature source Oral, resp. rate 16, height 5\' 6"  (1.676 m), weight 73 kg, SpO2 98 %, unknown if currently breastfeeding.  Physical Exam:  General: alert, cooperative and appears stated age 24: appropriate Uterine Fundus: firm Incision: N/A DVT Evaluation: No evidence of DVT seen on physical exam.  Recent Labs    07/10/19 0730  HGB 13.5  HCT 42.0    Assessment/Plan: Plan for discharge tomorrow   LOS: 1 day   Darlina Rumpf, CNM 07/11/2019, [phone]

## 2019-07-11 NOTE — Progress Notes (Signed)
Parents requesting Circ for infant, signed consent at bedside.

## 2019-07-11 NOTE — Progress Notes (Signed)
Dr.  Marice Potter notified that patient had has low BPs. She stated as long as she is not symptomatic it is ok.
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
## 2019-07-12 LAB — RH IG WORKUP (INCLUDES ABO/RH)
ABO/RH(D): O NEG
Fetal Screen: NEGATIVE
Gestational Age(Wks): 40.6
Unit division: 0

## 2019-07-12 MED ORDER — IBUPROFEN 600 MG PO TABS: 600.0000 mg | ORAL_TABLET | Freq: Four times a day (QID) | ORAL | 0 refills | Status: DC

## 2019-07-12 NOTE — Discharge Instructions (Signed)
Places to have your son circumcised (for self-pay patients//Medicaid now covers circumcisions):                                                                      [facility]     [phone]   $480 while you are in hospital         [facility]              [phone]   $269 by 4 wks                      [facility]                     [phone]   $269 by 7 days [facility]                    [phone]   $269 by 4 wks [facility]             [phone]   $225 by 2 wks    These prices sometimes change but are roughly what you can expect to pay. Please call and confirm pricing.   There are many reasons parents decide to have their sons circumsized. During the first year of life circumcised males have a reduced risk of urinary tract infections but after this year the rates between circumcised males and uncircumcised males are the same.  It can reduce rate of HIV and other STI infection later in life.  It is safe to have your son circumcised outside of the hospital and the places above perform them regularly.   Deciding about Circumcision in Baby Boys  (Up-to-date The Basics)  What is circumcision?   Circumcision is a surgery that removes the skin that covers the tip of the penis, called the "foreskin" Circumcision is usually done when a boy is between 1 and 10 days old. In the [place], circumcision is common. In some other countries, fewer boys are circumcised. Circumcision is a common tradition in some religions.  Should I have my baby boy circumcised?   There is no easy answer. Circumcision has some benefits. But it also has risks. After talking with your doctor, you will have to decide for yourself what is right for your family.  What are the benefits of circumcision?   Circumcised boys seem to have slightly lower rates of: ?Urinary tract infections ?Swelling of the opening at the tip of the penis Circumcised men seem to have slightly lower rates of: ?Urinary tract infections ?Swelling of the  opening at the tip of the penis ?Penis cancer ?HIV and other infections that you catch during sex, or transmit to a future partner(s) ?Cervical cancer in the women they have sex with Even so, in the [place], the risks of these problems are small - even in boys and men who have not been circumcised. Plus, boys and men who are not circumcised can reduce these extra risks by: ?Cleaning their penis well ?Using condoms during sex  What are the risks of circumcision?  Risks include: ?Bleeding or infection from the surgery ?Damage to or amputation of the penis ?A chance that the doctor will cut off too much or not enough of the   foreskin ?A chance that sex won't feel as good later in life Only about 1 out of every 200 circumcisions leads to problems. There is also a chance that your health insurance won't pay for circumcision.  How is circumcision done in baby boys?  First, the baby gets medicine for pain relief. This might be a cream on the skin or a shot into the base of the penis. Next, the doctor cleans the baby's penis well. Then he or she uses special tools to cut off the foreskin. Finally, the doctor wraps a bandage (called gauze) around the baby's penis. If you have your baby circumcised, his doctor or nurse will give you instructions on how to care for him after the surgery. It is important that you follow those instructions carefully.  

## 2019-07-17 ENCOUNTER — Other Ambulatory Visit: Payer: Self-pay

## 2019-07-17 ENCOUNTER — Ambulatory Visit (INDEPENDENT_AMBULATORY_CARE_PROVIDER_SITE_OTHER): Payer: Medicaid Other | Admitting: Clinical

## 2019-07-17 DIAGNOSIS — F4310 Post-traumatic stress disorder, unspecified: Secondary | ICD-10-CM

## 2019-07-21 NOTE — BH Specialist Note (Signed)
Integrated Behavioral Health via Telemedicine Video Visit  07/21/2019 Malak Beaner [id number]  Number of [place] visits: 5 Session Start time: 9:46  Session End time: 10:22 Total time: 22  Referring Provider: Emeterio Reeve, MD Type of Visit: Video Patient/Family location: Home [facility] Provider location: [facility]  All persons participating in visit: Patient Alice Castillo and [place]    Confirmed patient's address: Yes  Confirmed patient's phone number: Yes  Any changes to demographics: No   Confirmed patient's insurance: Yes  Any changes to patient's insurance: No   Discussed confidentiality: at previous visit  I connected with Luba Leotta by a video enabled telemedicine application and verified that I am speaking with the correct person using two identifiers.     I discussed the limitations of evaluation and management by telemedicine and the availability of in person appointments.  I discussed that the purpose of this visit is to provide behavioral health care while limiting exposure to the novel coronavirus.   Discussed there is a possibility of technology failure and discussed alternative modes of communication if that failure occurs.  I discussed that engaging in this video visit, they consent to the provision of behavioral healthcare and the services will be billed under their insurance.  Patient and/or legal guardian expressed understanding and consented to video visit: Yes   PRESENTING CONCERNS: Patient and/or family reports the following symptoms/concerns: Pt states her primary concern today is is lack of quality sleep (best sleep was 3 hours on Wednesday); pt goals are to improve sleep, move in one month, and continue breastfeeding when she returns to work. Pt is unsure about taking medication(including melatonin) for sleep, and agrees to discuss with medical provider at postpartum visit.  Pt is NOT experiencing hallucinations postpartum as previous pregnancy. Duration of problem: Postpartum; Severity of problem: Moderately severe  STRENGTHS (Protective Factors/Coping Skills): Supportive FOB   GOALS ADDRESSED: Patient will: 1.  Reduce symptoms of: anxiety, depression, insomnia and stress  2.  Increase knowledge and/or ability of: healthy habits  3.  Demonstrate ability to: Increase healthy adjustment to current life circumstances  INTERVENTIONS: Interventions utilized:  Sleep Hygiene and Psychoeducation and/or Health Education Standardized Assessments completed: not given today  ASSESSMENT: Patient currently experiencing Post-traumatic stress disorder .   Patient may benefit from continued psychoeducation and brief therapeutic interventions regarding coping with symptoms of anxiety, depression, sleep issue .  PLAN: 1. Follow up with behavioral health clinician on : Call Twan Harkin at [phone] if psychiatry does not call in two weeks 2. Behavioral recommendations:  -Continue taking prenatal vitamin daily; discuss with medical provider -Continue  Working with [place] new referral to psychiatry 3. Referral(s): [place] (In Clinic)  I discussed the assessment and treatment plan with the patient and/or parent/guardian. They were provided an opportunity to ask questions and all were answered. They agreed with the plan and demonstrated an understanding of the instructions.   They were advised to call back or seek an in-person evaluation if the symptoms worsen or if the condition fails to improve as anticipated.  [name] Shi Blankenship  Depression screen [facility] 2/9 06/18/2019 06/10/2019 04/29/2019 04/13/2019  Decreased Interest 3 2 2 2   Down, Depressed, Hopeless 3 2 3 2   PHQ - 2 Score 6 4 5 4   Altered sleeping 3 3 3 3   Tired, decreased energy 3 3 3 3   Change in appetite 3 3 2 3   Feeling  bad or failure about yourself  0 2 0 0   Trouble concentrating 1 2 1 1   Moving slowly or fidgety/restless 0 0 1 0  Suicidal thoughts 0 0 0 0  PHQ-9 Score 16 17 15 14    GAD 7 : Generalized Anxiety Score 06/18/2019 06/10/2019 04/29/2019 04/13/2019  Nervous, Anxious, on Edge 1 3 2 2   Control/stop worrying 3 3 1 1   Worry too much - different things 3 3 0 2  Trouble relaxing 3 3 3 3   Restless 1 3 3 2   Easily annoyed or irritable 3 3 3 3   Afraid - awful might happen 3 3 2 2   Total GAD 7 Score 17 21 14  15

## 2019-07-29 ENCOUNTER — Encounter (HOSPITAL_COMMUNITY): Payer: Self-pay | Admitting: Family Medicine

## 2019-07-29 ENCOUNTER — Other Ambulatory Visit: Payer: Self-pay

## 2019-07-29 ENCOUNTER — Inpatient Hospital Stay (HOSPITAL_COMMUNITY)
Admission: AD | Admit: 2019-07-29 | Discharge: 2019-07-29 | Disposition: A | Discharge status: Home / Self Care | Payer: Medicaid Other | Attending: Family Medicine | Admitting: Family Medicine

## 2019-07-29 DIAGNOSIS — F319 Bipolar disorder, unspecified: Secondary | ICD-10-CM | POA: Insufficient documentation

## 2019-07-29 DIAGNOSIS — F419 Anxiety disorder, unspecified: Secondary | ICD-10-CM | POA: Insufficient documentation

## 2019-07-29 DIAGNOSIS — O9123 Nonpurulent mastitis associated with lactation: Secondary | ICD-10-CM | POA: Diagnosis present

## 2019-07-29 DIAGNOSIS — D681 Hereditary factor XI deficiency: Secondary | ICD-10-CM | POA: Insufficient documentation

## 2019-07-29 MED ORDER — DICLOXACILLIN SODIUM 500 MG PO CAPS
500.0000 mg | ORAL_CAPSULE | Freq: Four times a day (QID) | ORAL | 0 refills | Status: DC
Start: 2019-07-29 — End: 2019-08-10

## 2019-07-29 NOTE — Discharge Instructions (Signed)
Breastfeeding and Human Lactation (4th ed., pp. 262-299). [place], [place]: [employer].">   Breastfeeding and Mastitis    Mastitis is inflammation of the breast tissue. It can occur in women who are breastfeeding. This can make breastfeeding painful. Mastitis will sometimes go away on its own, especially if it is not caused by an infection (non-infectious mastitis). Your health care provider will help determine if medical treatment is needed. Treatment may be needed if the condition is caused by a bacterial infection (infectious mastitis).  What are the causes?  This condition is often associated with a blocked milkduct, which can happen when too much milk builds up in the breast. Causes of excess milk in the breast can include:  Poor latch-on. If your baby is not latched onto the breast properly, he or she may not empty your breast completely while breastfeeding.  Allowing too much time to pass between feedings.  Wearing a bra or other clothing that is too tight. This puts extra pressure on the milk ducts so milk does not flow through them as it should.  Milk remaining in the breast because it is overfilled (engorged).  Stress and fatigue.  Mastitis can also be caused by a bacterial infection. Bacteria may enter the breast tissue through cuts, cracks, or openings in the skin near the nipple area. Cracks in the skin are often caused when your baby does not latch on properly to the breast.  What are the signs or symptoms?  Symptoms of this condition include:  Swelling, redness, tenderness, and pain in an area of the breast. This usually affects the upper part of the breast, toward the armpit region. In most cases, it affects only one breast. In some cases, it may occur on both breasts at the same time and affect a larger portion of breast tissue.  Swelling of the glands under the arm on the same side.  Fatigue, headache, and flu-like muscle aches.  Fever.  Rapid pulse.  Symptoms usually last 2 to 5  days. Breast pain and redness are at their worst on day 2 and day 3, and they usually go away by day 5. If an infection is left to progress, a collection of pus (abscess) may develop.  How is this diagnosed?  This condition can be diagnosed based on your symptoms and a physical exam. You may also have tests, such as:  Blood tests to determine if your body is fighting a bacterial infection.  Mammogram or ultrasound tests to rule out other problems or diseases.  Fluid tests. If an abscess has developed, the fluid in the abscess may be removed with a needle. The fluid may be analyzed to determine if bacteria are present.  Breast milk may be cultured and tested for bacteria.  How is this treated?  This condition will sometimes go away on its own. Your health care provider may choose to wait 24 hours after first seeing you to decide whether treatment is needed. If treatment is needed, it may include:  Strategies to manage breastfeeding. This includes continuing to breastfeed or pump in order to allow adequate milk flow, using breast massage, and applying heat or cold to the affected area.  Self-care such as rest and increased fluid intake.  Medicine for pain.  Antibiotic medicine to treat a bacterial infection. This is usually taken by mouth.  If an abscess has developed, it may be treated by removing fluid with a needle.  Follow these instructions at home:  Medicines  Take   were prescribed an antibiotic medicine, take it as told by your health care provider. Do not stop taking the antibiotic even if you start to feel better. General instructions  Do not wear a tight or underwire bra. Wear a soft, supportive bra.  Increase your fluid intake, especially if you have a fever.  Get plenty of rest. For breastfeeding:  Continue to empty your breasts as often as possible, either by  breastfeeding or using an electric breast pump. This will lower the pressure and the pain that comes with it. Ask your health care provider if changes need to be made to your breastfeeding or pumping routine.  Keep your nipples clean and dry.  During breastfeeding, empty the first breast completely before going to the other breast. If your baby is not emptying your breasts completely, use a breast pump to empty your breasts.  Use breast massage during feeding or pumping sessions.  If directed, apply moist heat to the affected area of your breast right before breastfeeding or pumping. Use the heat source that your health care provider recommends.  If directed, put ice on the affected area of your breast right after breastfeeding or pumping: ? Put ice in a plastic bag. ? Place a towel between your skin and the bag. ? Leave the ice on for 20 minutes.  If you go back to work, pump your breasts while at work to stay in time with your nursing schedule.  Do not allow your breasts to become engorged. Contact a health care provider if:  You have pus-like discharge from the breast.  You have a fever.  Your symptoms do not improve within 2 days of starting treatment.  Your symptoms return after you have recovered from a breast infection. Get help right away if:  Your pain and swelling are getting worse.  You have pain that is not controlled with medicine.  You have a red line extending from the breast toward your armpit. Summary  Mastitis is inflammation of the breast tissue. It is often caused by a blocked milk duct or bacteria.  This condition may be treated with hot and cold compresses, medicines, self-care, and certain breastfeeding strategies.  If you were prescribed an antibiotic medicine, take it as told by your health care provider. Do not stop taking the antibiotic even if you start to feel better.  Continue to empty your breasts as often as possible either by breastfeeding or  using an electric breast pump. This information is not intended to replace advice given to you by your health care provider. Make sure you discuss any questions you have with your health care provider. Document Revised: 11/01/2017 Document Reviewed: 02/14/2016 Elsevier Patient Education  [place].

## 2019-07-29 NOTE — MAU Provider Note (Signed)
History     CSN: [id number]  Arrival date and time: 07/29/19 [id number]   First Provider Initiated Contact with Patient 07/29/19 0143     No chief complaint on file.  Alice Castillo is a 24 y.o. [id number] at 19 days PP who receives [facility] at [facility].  She presents today for suspected masititis.  Patient states that today at [place] she started to "feel icky and very weak."  Patient states she took tylenol and ibuprofen and later took her temperature when was 101 axillary and 102.5 30 minutes later. She states that the breast pain has been present for a couple of days and she thought it was normal.  She describes the pain as "bad bruising" and states "it is excruciating when the milk comes in." Patient states she has been pumping 1-2x/day and has been latching infant to breast "all the time."  However, she states that she feels that the infant is not getting adequate intake and is not completely emptying the breast.  She states when she pumps she was initially getting one 8 oz bottle from each breast, but is now only getting 4oz from each breast.  Patient denies other concerns.      OB History    Gravida  4   Para  2   Term  2   Preterm      AB  2   Living  2     SAB      TAB  2   Ectopic      Multiple  0   Live Births  2           Past Medical History:  Diagnosis Date  . Anxiety   . Bipolar 1 disorder ([place])   . Chlamydia infection   . Depression   . 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 Headache     Past Surgical History:  Procedure Laterality Date  . NO PAST SURGERIES      Family History  Problem Relation Age of Onset  . Clotting disorder Father        Factor XI deficiency (affected or carrier?)  . Heart disease Maternal Uncle   . Diabetes Maternal Uncle   . Stroke Maternal Uncle   . Heart disease Paternal Uncle   . Diabetes Paternal Uncle   . Clotting disorder Paternal Uncle        Factor XI (66) deficiency  . Clotting disorder Paternal Uncle        Factor XI  deficiency  . Diabetes Maternal Grandmother   . Heart disease Maternal Grandmother   . Stroke Maternal Grandmother   . Diabetes Maternal Grandfather   . Heart disease Maternal Grandfather   . Stroke Maternal Grandfather     Social History   Tobacco Use  . Smoking status: Passive Smoke Exposure - Never Smoker  . Smokeless tobacco: Never Used  Substance Use Topics  . Alcohol use: Not Currently    Alcohol/week: 1.0 standard drinks    Types: 1 Shots of liquor per week    Comment: stopped when found out pregnant  . Drug use: No    Allergies:  Allergies  Allergen Reactions  . Food     Bananas-throat swells/shortness of breath  . Lactose Intolerance ([place]) Other (See Comments)    Stomach irritation, and diarhea    No medications prior to admission.    Review of Systems  Constitutional: Positive for chills and fever.  Genitourinary:       Breast tenderness  bilaterally   Physical Exam   Blood pressure 124/86, pulse (!) 107, temperature 99.5 F (37.5 C), temperature source Oral, resp. rate 18, SpO2 99 %, unknown if currently breastfeeding.  Physical Exam  Constitutional: She is oriented to person, place, and time. She appears well-developed and well-nourished. No distress.  HENT:  Head: Normocephalic and atraumatic.  Eyes: Conjunctivae are normal.  Cardiovascular: Normal rate.  Respiratory: Effort normal. Right breast exhibits tenderness. Left breast exhibits tenderness.    Breast are lactating. Nipples intact.   GI: Soft. There is no abdominal tenderness.  Musculoskeletal:     Cervical back: Normal range of motion.  Neurological: She is alert and oriented to person, place, and time.  Skin: Skin is warm and dry. There is pallor.  Psychiatric: She has a normal mood and affect. Her behavior is normal.    MAU Course  Procedures  MDM Physical Exam Education   Assessment and Plan  25 year old Postpartum State Mastitis Clogged Duct  -Reviewed exam findings and  mastitis confirmed as well as plugged duct. -Discussed relief measures including increased pumping with warm compress to help let down, cold compresses between pumping to decrease discomfort, continued tylenol and ibuprofen usage -Instructed to pump between feedings.  Reviewed massaging breast while pumping to help with clogged ducts.  -Rx for Dicloxacillin 500mg  PO Q 6 hrs x 10 Days called into pharmacy. -Patient warned that medication can cause diarrhea and thrush in infant and encouraged to monitor and contact pediatrician with onset of symptoms. -Informed that symptoms should improve in 2 days and if no improvement or worsening of symptoms to contact office for change in regime. -Patient without questions or concerns. -Discharged to home in stable condition.  Maryann Conners MSN, CNM Advanced Practice Provider, [facility] 07/29/2019, 3:40 AM

## 2019-07-29 NOTE — MAU Note (Signed)
Pt here with reports of breast pain even when not breastfeeding. Breast feel a little hard and warm to touch. Felt really weak today. Checked temp at home and fever was 101. Rechecked again about 30 mins later and it was 102.5. Took Tylenol and Ibuprofen around 5-6pm, but did not seem to help. Baby has a hard time latching.

## 2019-07-29 NOTE — MAU Note (Signed)
Pt unable to sign AVS due to system downtime. Discharge instructions and prescriptions reviewed with patient and patients mother.

## 2019-07-31 ENCOUNTER — Ambulatory Visit (INDEPENDENT_AMBULATORY_CARE_PROVIDER_SITE_OTHER): Payer: No Typology Code available for payment source | Admitting: Clinical

## 2019-07-31 DIAGNOSIS — F4310 Post-traumatic stress disorder, unspecified: Secondary | ICD-10-CM | POA: Diagnosis not present

## 2019-08-04 ENCOUNTER — Telehealth: Payer: Self-pay

## 2019-08-04 NOTE — Telephone Encounter (Signed)
[facility] RN called office with update. States she spoke with patient today. Followed up regarding mental health. [place] = 10; denies SI. Pt reports seeing [facility] and is currently waiting for psychiatry referral. Per chart review this referral is in process. Pt states she plans to follow up with our office on 08/10/19.

## 2019-08-10 ENCOUNTER — Telehealth (INDEPENDENT_AMBULATORY_CARE_PROVIDER_SITE_OTHER): Payer: Medicaid Other | Admitting: Certified Nurse Midwife

## 2019-08-10 ENCOUNTER — Encounter: Payer: Self-pay | Admitting: Certified Nurse Midwife

## 2019-08-10 ENCOUNTER — Other Ambulatory Visit: Payer: Self-pay

## 2019-08-10 DIAGNOSIS — K5900 Constipation, unspecified: Secondary | ICD-10-CM

## 2019-08-10 DIAGNOSIS — R102 Pelvic and perineal pain: Secondary | ICD-10-CM

## 2019-08-10 DIAGNOSIS — F4310 Post-traumatic stress disorder, unspecified: Secondary | ICD-10-CM

## 2019-08-10 MED ORDER — DOCUSATE SODIUM 100 MG PO CAPS: 100.0000 mg | ORAL_CAPSULE | Freq: Two times a day (BID) | ORAL | 2 refills | Status: DC | PRN

## 2019-08-10 NOTE — Progress Notes (Signed)
I connected with Alice Castillo on 08/10/19 at 1444 by: MyChart and verified that I am speaking with the correct person using two identifiers.  Patient is located at home and provider is located at [employer] for [employer] at [employer] [facility].     The purpose of this virtual visit is to provide medical care while limiting exposure to the novel coronavirus. I discussed the limitations, risks, security and privacy concerns of performing an evaluation and management service by MyChart and the availability of in person appointments. I also discussed with the patient that there may be a patient responsible charge related to this service. By engaging in this virtual visit, you consent to the provision of healthcare.  Additionally, you authorize for your insurance to be billed for the services provided during this visit.  The patient expressed understanding and agreed to proceed.  The following staff members participated in the virtual visit: Louisa Second, RN and Darrol Poke CNM   Post Partum Visit Note Subjective:   Alice Castillo is a 24 y.o. [phone] female being evaluated for postpartum followup.  She is 4 weeks 3 days postpartum following a normal spontaneous vaginal delivery at  40w 6d.  I have fully reviewed the prenatal and intrapartum course; pregnancy complicated by Rh negative state, Asymptomatic COVID19, and PTSD.  Postpartum course has been complicated by constipation, abdominal cramping, pelvic and tailbone pain. Baby is doing well. Baby is feeding by breast. Bleeding is red; reports changing pad two-three times daily, describes "light period." Bowel function is abnormal: pt reports "really bad cramps, like I am contracting while trying to go number two." Having BM once every other day.  Bladder function is normal. Patient is not sexually active. Contraception method is Depo-Provera injections. Postpartum depression screening: positive.   The following portions of the patient's  history were reviewed and updated as appropriate: allergies, current medications, past medical history and problem list.  Review of Systems Pertinent items noted in HPI and remainder of comprehensive ROS otherwise negative.   Objective:   Vitals:   08/10/19 1433  BP: 111/81  Pulse: 72   Self-Obtained       Assessment/Plan:  1. Postpartum care and examination -  Abnormal postpartum examination d/t abdominal pain and patient able to feel presence of vaginal stitches  - Appointment was made for patient to be seen in person to assess and obtain Pap smear as well  - AMB referral to rehabilitation  2. PTSD (post-traumatic stress disorder) - Patient was referred to psychiatrist, does not have appointment yet   3. Constipation, unspecified constipation type - docusate sodium (COLACE) 100 MG capsule; Take 1 capsule (100 mg total) by mouth 2 (two) times daily as needed.  Dispense: 30 capsule; Refill: 2   Plan:  Essential components of care per ACOG recommendations:  1.  Mood and well being: Patient with positive depression screening today. Reviewed local resources for support.  - Patient does not use tobacco. - hx of drug use? No.  2. Infant care and feeding:  -Patient currently breastmilk feeding? Yes Reviewed importance of draining breast regularly to support lactation. Patient reports that she produces milk but not much. Ordered lactation support vitamins.  -Social determinants of health (SDOH) reviewed in EPIC. No concerns  3. Sexuality, contraception and birth spacing - Patient does not want a pregnancy in the next year.  Desired family size is unknown at this time.  - Reviewed forms of contraception in tiered fashion. Patient desired Depo-Provera today.   -  Discussed birth spacing of 18 months  4. Sleep and fatigue -Encouraged family/partner/community support of 4 hrs of uninterrupted sleep to help with mood and fatigue  5. Physical Recovery  - Discussed patients delivery  and complications - Patient had a 2nd degree laceration, perineal healing reviewed. Patient expressed understanding. Patient reports presence of stitches and being able to palpate them. She also reports tailbone pain and pelvic pain. Patient was referred to pelvic floor PT  - Patient has urinary incontinence? No  - Patient is not safe to resume physical and sexual activity  6.  Health Maintenance - Last pap smear done 12/2014 and was normal with negative HPV. Needs pap scheduled.    11 minutes of non-face-to-face time spent with the patient    Lajean Manes, [place] [facility] [place]

## 2019-08-11 ENCOUNTER — Other Ambulatory Visit (HOSPITAL_COMMUNITY)
Admission: RE | Admit: 2019-08-11 | Discharge: 2019-08-11 | Disposition: A | Discharge status: Home / Self Care | Payer: Medicaid Other | Source: Ambulatory Visit | Attending: Student | Admitting: Student

## 2019-08-11 ENCOUNTER — Encounter: Payer: Self-pay | Admitting: Student

## 2019-08-11 ENCOUNTER — Ambulatory Visit (INDEPENDENT_AMBULATORY_CARE_PROVIDER_SITE_OTHER): Payer: Medicaid Other | Admitting: Student

## 2019-08-11 ENCOUNTER — Other Ambulatory Visit: Payer: Self-pay

## 2019-08-11 ENCOUNTER — Telehealth (INDEPENDENT_AMBULATORY_CARE_PROVIDER_SITE_OTHER): Payer: Medicaid Other

## 2019-08-11 VITALS — BP 105/71 | HR 63 | Ht 66.0 in | Wt 137.5 lb

## 2019-08-11 DIAGNOSIS — O9089 Other complications of the puerperium, not elsewhere classified: Secondary | ICD-10-CM | POA: Diagnosis not present

## 2019-08-11 DIAGNOSIS — O99340 Other mental disorders complicating pregnancy, unspecified trimester: Secondary | ICD-10-CM

## 2019-08-11 DIAGNOSIS — M6289 Other specified disorders of muscle: Secondary | ICD-10-CM

## 2019-08-11 DIAGNOSIS — F4310 Post-traumatic stress disorder, unspecified: Secondary | ICD-10-CM

## 2019-08-11 DIAGNOSIS — F419 Anxiety disorder, unspecified: Secondary | ICD-10-CM

## 2019-08-11 NOTE — Telephone Encounter (Signed)
[facility] to follow up about referral. Staff member states first available new pt appt is end of August or beginning of September. Staff member suggests patient be seen at new location, states they will send referral to that office. Phone number given so that clinical staff can follow up.   Called new [place] location at [place] to schedule new patient appt. First available appt is 08/27/19 at 12PM, appt will be virtual. Called patient with appt time. Followed up with patient regarding PT referral; pt states she has been contacted by PT with appt times going forward.

## 2019-08-11 NOTE — Patient Instructions (Signed)
Pelvic Floor Dysfunction  Pelvic floor dysfunction (PFD) is a condition that results when the group of muscles and connective tissues that support the organs in the pelvis (pelvic floor muscles) do not work well. These muscles and their connections form a sling that supports the colon and bladder. In men, these muscles also support the prostate gland. In women, they also support the uterus. PFD causes pelvic floor muscles to be too weak, too tight, or a combination of both. In PFD, muscle movements are not coordinated. This condition may cause bowel or bladder problems. It may also cause pain. What are the causes? This condition may be caused by an injury to the pelvic area or by a weakening of pelvic muscles. This often results from pregnancy and childbirth or other types of strain. In many cases, the exact cause is not known. What increases the risk? The following factors may make you more likely to develop this condition:  Having a condition of chronic bladder tissue inflammation (interstitial cystitis).  Being an older person.  Being overweight.  Radiation treatment for cancer in the pelvic region.  Previous pelvic surgery, such as removal of the uterus (hysterectomy) or prostate gland (prostatectomy). What are the signs or symptoms? Symptoms of this condition vary and may include:  Bladder symptoms, such as: ? Trouble starting urination and emptying the bladder. ? Frequent urinary tract infections. ? Leaking urine when coughing, laughing, or exercising (stress incontinence). ? Having to pass urine urgently or frequently. ? Pain when passing urine.  Bowel symptoms, such as: ? Constipation. ? Urgent or frequent bowel movements. ? Incomplete bowel movements. ? Painful bowel movements. ? Leaking stool or gas.  Unexplained genital or rectal pain.  Genital or rectal muscle spasms.  Low back pain. In women, symptoms of PFD may also include:  A heavy, full, or aching feeling in  the vagina.  A bulge that protrudes into the vagina.  Pain during or after sexual intercourse. How is this diagnosed? This condition may be diagnosed based on:  Your symptoms and medical history.  A physical exam. During the exam, your health care provider may check your pelvic muscles for tightness, spasm, pain, or weakness. This may include a rectal exam and a pelvic exam for women. In some cases, you may have diagnostic tests, such as:  Electrical muscle function tests.  Urine flow testing.  X-ray tests of bowel function.  Ultrasound of the pelvic organs. How is this treated? Treatment for this condition depends on your symptoms. Treatment options include:  Physical therapy. This may include Kegel exercises to help relax or strengthen the pelvic floor muscles.  Biofeedback. This type of therapy provides feedback on how tight your pelvic floor muscles are so that you can learn to control them.  Internal or external massage therapy.  A treatment that involves electrical stimulation of the pelvic floor muscles to help control pain (transcutaneous electrical nerve stimulation, or TENS).  Sound wave therapy (ultrasound) to reduce muscle spasms.  Medicines, such as: ? Muscle relaxants. ? Bladder control medicines. Surgery to reconstruct or support pelvic floor muscles may be an option if other treatments do not help. Follow these instructions at home: Activity  Do your usual activities as told by your health care provider. Ask your health care provider if you should modify any activities.  Do pelvic floor strengthening or relaxing exercises at home as told by your physical therapist. Lifestyle  Maintain a healthy weight.  Eat foods that are high in fiber, such as  beans, whole grains, and fresh fruits and vegetables.  Limit foods that are high in fat and processed sugars, such as fried or sweet foods.  Manage stress with relaxation techniques such as yoga or  meditation. General instructions  If you have problems with leakage: ? Use absorbable pads or wear padded underwear. ? Wash frequently with mild soap. ? Keep your genital and anal area as clean and dry as possible. ? Ask your health care provider if you should try a barrier cream to prevent skin irritation.  Take warm baths to relieve pelvic muscle tension or spasms.  Take over-the-counter and prescription medicines only as told by your health care provider.  Keep all follow-up visits as told by your health care provider. This is important. Contact a health care provider if you:  Are not improving with home care.  Have signs or symptoms of PFD that get worse at home.  Develop new signs or symptoms at home.  Have signs of a urinary tract infection, such as: ? Fever. ? Chills. ? Urinary frequency. ? A burning feeling when urinating.  Have not had a bowel movement in 3 days (constipation). Summary  Pelvic floor dysfunction results when the muscles and connective tissues in your pelvic floor do not work well.  These muscles and their connections form a sling that supports your colon and bladder. In men, these muscles also support the prostate gland. In women, they also support the uterus.  PFD may be caused by an injury to the pelvic area or by a weakening of pelvic muscles.  PFD causes pelvic floor muscles to be too weak, too tight, or a combination of both. Symptoms may vary from person to person.  In most cases, PFD can be treated with physical therapies and medicines. Surgery may be an option if other treatments do not help. This information is not intended to replace advice given to you by your health care provider. Make sure you discuss any questions you have with your health care provider. Document Revised: 09/02/2017 Document Reviewed: 09/02/2017 Elsevier Patient Education  2020 Elsevier Inc.  

## 2019-08-11 NOTE — Progress Notes (Signed)
Here for pelvic pain/ tailbone pain/ check stitches/ c/o of a lot of pain when has BM.  Elevated phq9 and gad 7. Has seen [name] and she referred her out but had not heard from anyone yet until yesterday - and they gave her a list . States does not need to see [name] today. Did send message to [name] to notify.  Gaylord Seydel,RN

## 2019-08-11 NOTE — Progress Notes (Signed)
Patient ID: Alice Castillo, female   DOB: December 22, 1995, 24 y.o.   MRN: [id number]  History:  Alice Castillo is a 24 y.o. [id number] who presents to clinic today for pain with BM and check on her stitches. She reports that she had a second degree repaired and she wants to make her stitches are ok. Her tailbone also hurts during BM. She also desires a [facility] referral. Reports that infant is doing well. She had a PP visit yesterday.   The following portions of the patient's history were reviewed and updated as appropriate: allergies, current medications, family history, past medical history, social history, past surgical history and problem list.  Review of Systems:  Review of Systems  Constitutional: Negative.   HENT: Negative.   Respiratory: Negative.   Cardiovascular: Negative.   Gastrointestinal: Negative.   Genitourinary: Negative.   Skin: Negative.   Reports significant pain in her "tailbone" during BM.     Objective:  Physical Exam BP 105/71   Pulse 63   Ht 5\' 6"  (1.676 m)   Wt 137 lb 8 oz (62.4 kg)   BMI 22.19 kg/m  Physical Exam  HENT:  Head: Normocephalic.  Mouth/Throat: Mucous membranes are moist.  GI: Normal appearance.  Musculoskeletal:        General: Normal range of motion.  Neurological: She is alert.  Skin: Skin is warm.  Psychiatric: Mood normal.  laceration is well-healed; small area of granulation tissue on perineum. No hemorrhoids; vaginal floor is intact. Mild cystocele and rectocele present. No other anomalies or signs of trauma. Cervix is pink with no lesions; no blood in the vagina, normal discharge present.     Labs and Imaging No results found for this or any previous visit (from the past 24 hour(s)).  No results found.   Assessment & Plan:  1. Postpartum complication -Patient will start stool softeners; reassured her that her exam is reassuring and that laceration is well-healed. Discussion about pelvic floor muscles and impact of vaginal birth on  pelvic floor, pain is to be expected but that PT can help.  -Referral to pelvic floor PT made; patient knows they will call her.  -[facility] referral also coordinated by RN - Cytology - PAP( [place])  Approximately 20 minutes of face-to-face time was spent with this patient   [place], [place] 08/11/2019 1:59 PM

## 2019-08-12 LAB — CYTOLOGY - PAP: Diagnosis: NEGATIVE

## 2019-08-27 ENCOUNTER — Other Ambulatory Visit: Payer: Self-pay

## 2019-08-27 ENCOUNTER — Telehealth (INDEPENDENT_AMBULATORY_CARE_PROVIDER_SITE_OTHER): Payer: Medicaid Other | Admitting: Psychiatric/Mental Health

## 2019-08-27 DIAGNOSIS — F333 Major depressive disorder, recurrent, severe with psychotic symptoms: Secondary | ICD-10-CM | POA: Diagnosis not present

## 2019-08-27 DIAGNOSIS — F53 Postpartum depression: Secondary | ICD-10-CM | POA: Insufficient documentation

## 2019-08-27 DIAGNOSIS — O99345 Other mental disorders complicating the puerperium: Secondary | ICD-10-CM | POA: Diagnosis not present

## 2019-08-27 MED ORDER — SERTRALINE HCL 50 MG PO TABS: 50.0000 mg | ORAL_TABLET | Freq: Every day | ORAL | 2 refills | Status: DC

## 2019-08-27 NOTE — Progress Notes (Signed)
Psychiatric Initial Adult Assessment  Virtual Visit via Video Note  I connected with Alice Castillo  on 08/27/19  by a video enabled telemedicine application and verified that I am speaking with the correct person using two identifiers.  Location: Patient: Home Provider: Home office  I discussed the limitations of evaluation and management by telemedicine and the availability of in person appointments. The patient expressed understanding and agreed to proceed.  I provided 45 minutes of non-face-to-face time during this encounter.  Patient Identification: Alice Castillo MRN:  [id number] Date of Evaluation:  08/27/2019 Referral Source: [place] [facility]  Chief Complaint:  "Just tired" Visit Diagnosis:    ICD-10-CM   1. Major depressive disorder, recurrent, severe with psychotic features (HCC)  F33.3 sertraline (ZOLOFT) 50 MG tablet    DISCONTINUED: sertraline (ZOLOFT) 50 MG tablet    DISCONTINUED: sertraline (ZOLOFT) 50 MG tablet  2. Post partum depression  O99.345 sertraline (ZOLOFT) 50 MG tablet   F53.0 DISCONTINUED: sertraline (ZOLOFT) 50 MG tablet    History of Present Illness:  Alice Castillo is a 24  year-old female seen today for initial psych evaluation.  Patient was referred to outpatient psychiatry by [facility] for medication management.  On assessment patient reports that she is "just tired ".  8 gave birth a month ago and says she has been feeling really depressed.  She denies SI, and denies wanting to hurt anyone else including the baby.  She states that she has always had depression but feels that it gets worse after she gives birth.  This is states second child the first child is 29 years old and baby reports having postpartum depression with that baby is well.  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  She says she does have help for a couple hours a day.  Patient is not open to only trying Prozac for depression as she is breast-feeding, not to mention, that she feels Prozac makes her  depression worse.  Alice Castillo is open to a trial of Zoloft.  Zoloft is said to be safe for breast-feeding moms.  Patient will begin taking 50 mg of Zoloft daily.  Patient will follow up with writer in 6 weeks to reassess effectiveness.  No further concerns at this time.  Associated Signs/Symptoms: Depression Symptoms:  insomnia, hopelessness, anxiety, (Hypo) Manic Symptoms:  na Anxiety Symptoms:  na Psychotic Symptoms:  na PTSD Symptoms: NA  Past Psychiatric History: PP depresssion   Previous Psychotropic Medications: No   Substance Abuse History in the last 12 months:  No.  Consequences of Substance Abuse: NA  Past Medical History:  Past Medical History:  Diagnosis Date  . Anxiety   . Bipolar 1 disorder ([place])   . Chlamydia infection   . Depression   . 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 Headache   . Supervision of high risk pregnancy, antepartum 04/13/2019    Nursing Staff Provider Office Location [place] Dating  LMP c/w 30wk [place] Language  english Anatomy US  Normal Flu Vaccine  declined Genetic Screen  NIPS:   AFP:   First Screen:  Quad:   TDaP vaccine  04/13/2019 Hgb A1C or  GTT 2hr GTT normal  Rhogam  04/06/2019   LAB RESULTS  Feeding Plan Breast Blood Type O/Negative/-- (02/15 1414)  Contraception Depo Antibody Positive, See Final Results (02/15 1414    Past Surgical History:  Procedure Laterality Date  . NO PAST SURGERIES      Family Psychiatric History: unknown  Family  History:  Family History  Problem Relation Age of Onset  . Clotting disorder Father        Factor XI deficiency (affected or carrier?)  . Heart disease Maternal Uncle   . Diabetes Maternal Uncle   . Stroke Maternal Uncle   . Heart disease Paternal Uncle   . Diabetes Paternal Uncle   . Clotting disorder Paternal Uncle        Factor XI (53) deficiency  . Clotting disorder Paternal Uncle        Factor XI deficiency  . Diabetes Maternal Grandmother   . Heart disease Maternal Grandmother   . Stroke  Maternal Grandmother   . Diabetes Maternal Grandfather   . Heart disease Maternal Grandfather   . Stroke Maternal Grandfather     Social History:   Social History   Socioeconomic History  . Marital status: Single    Spouse name: Not on file  . Number of children: Not on file  . Years of education: Not on file  . Highest education level: Not on file  Occupational History  . Not on file  Tobacco Use  . Smoking status: Passive Smoke Exposure - Never Smoker  . Smokeless tobacco: Never Used  Vaping Use  . Vaping Use: Never used  Substance and Sexual Activity  . Alcohol use: Not Currently    Alcohol/week: 1.0 standard drink    Types: 1 Shots of liquor per week    Comment: stopped when found out pregnant  . Drug use: No  . Sexual activity: Not Currently    Birth control/protection: None  Other Topics Concern  . Not on file  Social History Narrative  . Not on file   Social Determinants of Health   Financial Resource Strain:   . Difficulty of Paying Living Expenses:   Food Insecurity: No Food Insecurity  . Worried About Charity fundraiser in the Last Year: Never true  . Ran Out of Food in the Last Year: Never true  Transportation Needs: Unmet Transportation Needs  . Lack of Transportation (Medical): No  . Lack of Transportation (Non-Medical): Yes  Physical Activity:   . Days of Exercise per Week:   . Minutes of Exercise per Session:   Stress:   . Feeling of Stress :   Social Connections:   . Frequency of Communication with Friends and Family:   . Frequency of Social Gatherings with Friends and Family:   . Attends Religious Services:   . Active Member of Clubs or Organizations:   . Attends Archivist Meetings:   Marland Kitchen Marital Status:     Additional Social History: lives with two childen  Allergies:   Allergies  Allergen Reactions  . Food     Bananas-throat swells/shortness of breath  . Lactose Intolerance (Gi) Other (See Comments)    Stomach irritation,  and diarhea    Metabolic Disorder Labs: Lab Results  Component Value Date   HGBA1C 4.6 (L) 04/13/2019   No results found for: PROLACTIN No results found for: CHOL, TRIG, HDL, CHOLHDL, VLDL, LDLCALC No results found for: TSH  Therapeutic Level Labs: No results found for: LITHIUM No results found for: CBMZ No results found for: VALPROATE  Current Medications: Current Outpatient Medications  Medication Sig Dispense Refill  . acetaminophen (TYLENOL) 500 MG tablet Take 1,000 mg by mouth every 6 (six) hours as needed.    . docusate sodium (COLACE) 100 MG capsule Take 1 capsule (100 mg total) by mouth 2 (two) times daily  as needed. 30 capsule 2  . ibuprofen (ADVIL) 600 MG tablet Take 1 tablet (600 mg total) by mouth every 6 (six) hours. 30 tablet 0  . Prenatal Vit-Fe Fumarate-FA (PRENATAL MULTIVITAMIN) TABS tablet Take 1 tablet by mouth daily at 12 noon.    . sertraline (ZOLOFT) 50 MG tablet Take 1 tablet (50 mg total) by mouth daily. 30 tablet 2   No current facility-administered medications for this visit.    Musculoskeletal: Strength & Muscle Tone: within normal limits Gait & Station: normal Patient leans: N/A  Psychiatric Specialty Exam: Review of Systems  All other systems reviewed and are negative.   unknown if currently breastfeeding.There is no height or weight on file to calculate BMI.  General Appearance: Casual and Guarded  Eye Contact:  Fair  Speech:  Clear and Coherent  Volume:  Normal  Mood:  Depressed  Affect:  Depressed  Thought Process:  Coherent and Descriptions of Associations: Intact  Orientation:  Full (Time, Place, and Person)  Thought Content:  Logical  Suicidal Thoughts:  No  Homicidal Thoughts:  No  Memory:  Immediate;   Good  Judgement:  Good  Insight:  Good  Psychomotor Activity:  Normal  Concentration:  Concentration: Fair  Recall:  [employer] of Knowledge:Fair  Language: Fair  Akathisia:  NA  Handed:  Right  AIMS (if indicated):  not  done  Assets:  Communication Skills Desire for Improvement Financial Resources/Insurance  ADL's:  Intact  Cognition: WNL  Sleep:  Poor   Screenings: AIMS     Admission (Discharged) from 09/18/2014 in [facility]  AIMS Total Score 0    AUDIT     Admission (Discharged) from 09/18/2014 in [facility]  Alcohol Use Disorder Identification Test Final Score (AUDIT) 1    GAD-7     Office Visit from 08/11/2019 in [place] [facility] [place] at [facility] Video Visit from 06/18/2019 in [place] for [facility] Routine Prenatal from 06/10/2019 in [place] for [facility] Routine Prenatal from 04/29/2019 in [place] for [facility] Initial Prenatal from 04/13/2019 in [facility] for [facility]  Total GAD-7 Score 13 17 21 14 15     PHQ2-9     Office Visit from 08/11/2019 in [place] for [place] at [facility] Video Visit from 06/18/2019 in [place] for [facility] Routine Prenatal from 06/10/2019 in [place] for [facility] Routine Prenatal from 04/29/2019 in [place] for [facility] Initial Prenatal from 04/13/2019 in [place] for [facility]  PHQ-2 Total Score 6 6 4 5 4   PHQ-9 Total Score 16 16 17 15 14       Assessment and Plan: Pt reported that she will take zoloft 50mg  daily in the am as prescribed (although reluctant) to manage depressive symptoms.   Zoloft is the only medication patient is willing to take at  This time because she is currently breastfeeding her new born baby.   1. Major Depression Disorder/Post Partum Depression  -Start Zoloft 50mg  (safe for breastfeeding moms)      Deloria Lair, NP 7/1/20211:54 PM

## 2019-08-31 ENCOUNTER — Encounter (HOSPITAL_COMMUNITY): Payer: Self-pay | Admitting: Psychiatric/Mental Health

## 2019-09-17 ENCOUNTER — Other Ambulatory Visit: Payer: Self-pay

## 2019-09-17 ENCOUNTER — Encounter: Payer: Self-pay | Admitting: Physical Therapy

## 2019-09-17 ENCOUNTER — Ambulatory Visit: Payer: Medicaid Other | Attending: Certified Nurse Midwife | Admitting: Physical Therapy

## 2019-09-17 DIAGNOSIS — R252 Cramp and spasm: Secondary | ICD-10-CM | POA: Insufficient documentation

## 2019-09-17 DIAGNOSIS — M6281 Muscle weakness (generalized): Secondary | ICD-10-CM

## 2019-09-17 DIAGNOSIS — M533 Sacrococcygeal disorders, not elsewhere classified: Secondary | ICD-10-CM | POA: Diagnosis present

## 2019-09-17 NOTE — Therapy (Signed)
[facility] [address] [place], [place], [address] Phone: [phone]   Fax:  [phone]  Physical Therapy Treatment  Patient Details  Name: Alice Castillo MRN: [id number] Date of Birth: [birth year] Referring Provider (PT): Dr. Darrol Poke   Encounter Date: 09/17/2019   PT End of Session - 09/17/19 1539    Visit Number 1    Date for PT Re-Evaluation 12/10/19    Authorization Type [place] Medicaid UHC    PT Start Time 1400    PT Stop Time 1445    PT Time Calculation (min) 45 min    Activity Tolerance Patient tolerated treatment well;No increased pain    Behavior During Therapy WFL for tasks assessed/performed           Past Medical History:  Diagnosis Date  . Anxiety   . Bipolar 1 disorder ([place])   . Chlamydia infection   . Depression   . 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 Headache   . Supervision of high risk pregnancy, antepartum 04/13/2019    Nursing Staff Provider Office Location [place] Dating  LMP c/w 30wk [place] Language  english Anatomy US  Normal Flu Vaccine  declined Genetic Screen  NIPS:   AFP:   First Screen:  Quad:   TDaP vaccine  04/13/2019 Hgb A1C or  GTT 2hr GTT normal  Rhogam  04/06/2019   LAB RESULTS  Feeding Plan Breast Blood Type O/Negative/-- (02/15 1414)  Contraception Depo Antibody Positive, See Final Results (02/15 1414    Past Surgical History:  Procedure Laterality Date  . NO PAST SURGERIES      There were no vitals filed for this visit.   Subjective Assessment - 09/17/19 1406    Subjective Patient had a child on 07/10/2019 and had tailbone pain. She feels a popping. Breast feeding.    Patient Stated Goals reduce coccyx pain    Currently in Pain? Yes    Pain Score 4     Pain Location Coccyx    Pain Orientation Mid    Pain Descriptors / Indicators Stabbing    Pain Type Acute pain    Pain Onset More than a month ago    Pain Frequency Intermittent    Aggravating Factors  sitting, after  sitting and move will feel a pop, sometimes with bowel movement when constipated; laying on back    Pain Relieving Factors sit bent forward to be off tailbone; liquid diet to avoid constipation; standing              [facility] PT Assessment - 09/17/19 0001      Assessment   Medical Diagnosis Z39.2 Postpartum care and examination; R10.2 Pelvic pain    Referring Provider (PT) Dr. Darrol Poke    Onset Date/Surgical Date 07/10/19    Prior Therapy none      Precautions   Precautions None      Restrictions   Weight Bearing Restrictions No      Balance Screen   Has the patient fallen in the past 6 months No    Has the patient had a decrease in activity level because of a fear of falling?  No    Is the patient reluctant to leave their home because of a fear of falling?  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      Cognition   Overall Cognitive Status Within Functional Limits for tasks assessed      Posture/Postural Control   Posture/Postural Control Postural limitations    Posture Comments leaning forward      ROM / Strength   AROM / PROM / Strength AROM;PROM;Strength      AROM   Lumbar Flexion decreased by 25% with coccyx pain and tightness in lumbar    Lumbar Extension decreased by 25% with pop in lumbar spine    Lumbar - Left Rotation decreased by 25%      PROM   Right Hip External Rotation  55    Left Hip External Rotation  30      Strength   Left Hip Flexion 4+/5      Palpation   SI assessment  right ilium anteriorly rotated and left ASIS is shallow    Palpation comment tenderness located on the right quadratus, right piriformis and gluteal, bil.  levator ani      Special Tests    Special Tests Sacrolliac Tests    Sacroiliac Tests  Pelvic Distraction      Pelvic Dictraction   Findings Positive    Side  Right    Comment pain                       Pelvic Floor Special Questions - 09/17/19 0001    Prior Pregnancies Yes    Number of Pregnancies 2    Number of Vaginal Deliveries 2   3rd and 2nd degree   Diastasis Recti 1 finger width with good tension    Currently Sexually Active Yes    Is this Painful Yes   deeper   Urinary Leakage Yes    Activities that cause leaking Sneezing    Skin Integrity Intact    Perineal Body/Introitus  Other    Perineal Body/Introitus other tight due to scar from tears    Pelvic Floor Internal Exam Patient confirmed identificaiton and approves PT to assess pelvic floor and treatment    Exam Type Rectal    Palpation tenderness along the coccygeus, puborectalis, ischiococcygeus    Strength fair squeeze, definite lift             [facility] Adult PT Treatment/Exercise - 09/17/19 0001      Manual Therapy   Manual Therapy Muscle Energy Technique;Internal Pelvic Floor    Internal Pelvic Floor levator ani, distraction of the coccygeus, fascial release around the coccyx    Muscle Energy Technique correct ilium                    PT Short Term Goals - 09/17/19 1551      PT SHORT TERM GOAL #1   Title independent with initial HEP    Time 4    Period Weeks    Status New    Target Date 10/15/19      PT SHORT TERM GOAL #2   Title coccyx pain with sitting decreased >/= 25%    Time 4    Period Weeks    Status New    Target Date 10/15/19             PT Long Term Goals - 09/17/19 1552      PT LONG TERM GOAL #1   Title independent with advanced HEP    Time 12    Period Weeks    Status New    Target  Date 12/10/19      PT LONG TERM GOAL #2   Title able to sit for 1 hour with pain decreased >/= 75% due to elongation of tissue and increased movement of the coccyx    Time 12    Period Weeks    Status New    Target Date 12/10/19      PT LONG TERM GOAL #3   Title go from sit to stand with pain decreased >/= 75% due to pelvis in correct alignment     Time 12    Period Weeks    Status New    Target Date 12/10/19      PT LONG TERM GOAL #4   Title able to have a bowel movement without coccyx pain    Time 12    Period Weeks    Status New    Target Date 12/10/19                 Plan - 09/17/19 1541    Clinical Impression Statement Patient is a 24 year old female with coccyx pain since she had a vaginal birth 07/10/2019. Patient reports her pain is 4/10 with sit to stand, sit long period, lay on her back. Lumbar flexion is 25% decreased with pain in the sacrum and coccyx.Left hip external rotation P/ROM is 30 degrees when the right is 55 degrees. Left hip flexion 4+/5. Right ilium anteriorly rotated. Leak with sneeze. Decreased mobility of perineal body due to having second and third degree tear. Positive SI distraction on the right. Tenderness located on the piriformis, gluteal, right quadratus, levator ani and  coccygeus. Decreased movement of the coccyx. Pelvic floor strength is 3/5. Patient will benefit from skilled therapy to improve coordination of the pelvic floor and elongation of the pelvic floor to reduce coccyx pain.    Personal Factors and Comorbidities Comorbidity 3+;Sex    Comorbidities Bipolar, PTSD; Mild cystocele and rectocele    Examination-Activity Limitations Continence;Sit;Transfers;Toileting    Examination-Participation Restrictions Driving    Stability/Clinical Decision Making Evolving/Moderate complexity    Clinical Decision Making Low    Rehab Potential Excellent    PT Frequency 1x / week    PT Duration 12 weeks    PT Treatment/Interventions ADLs/Self Care Home Management;Biofeedback;Cryotherapy;Electrical Stimulation;Moist Heat;Ultrasound;Neuromuscular re-education;Therapeutic exercise;Therapeutic activities;Patient/family education;Manual techniques;Dry needling;Scar mobilization;Spinal Manipulations    PT Next Visit Plan coccyx mobilization, check pelvic alignment; elongation of the pelvic floor, opening rib  cage, SI stabilzation,    Consulted and Agree with Plan of Care Patient           Patient will benefit from skilled therapeutic intervention in order to improve the following deficits and impairments:  Decreased coordination, Decreased range of motion, Increased fascial restricitons, Pain, Decreased endurance, Decreased mobility, Decreased strength  Visit Diagnosis: Muscle weakness (generalized) - Plan: PT plan of care cert/re-cert  Cramp and spasm - Plan: PT plan of care cert/re-cert  Coccyx pain - Plan: PT plan of care cert/re-cert     Problem List Patient Active Problem List   Diagnosis Date Noted  . Post partum depression 08/27/2019  . Pelvic floor dysfunction 08/11/2019  . Post-dates pregnancy 07/10/2019  . Asymptomatic COVID-19 virus infection 07/10/2019  . Rubella non-immune status, antepartum 05/15/2019  . 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 Family history of genetic disorder 04/07/2015  . Rh negative state in antepartum period 03/22/2015  . Family history of clotting disorder 03/22/2015  . Paresthesias in antepartum period in third trimester 03/22/2015  .  Anxiety during pregnancy, antepartum 01/24/2015  . PTSD (post-traumatic stress disorder) 09/22/2014  . Major depressive disorder, recurrent, severe with psychotic features ([place]) 09/18/2014  . Hallucinations    Earlie Counts, PT 09/17/19 3:57 PM   [facility] [address] [place], [place], [address] Phone: [phone]   Fax:  [phone]  Name: Alice Castillo MRN: [id number] Date of Birth: Jul 25, 1995

## 2019-09-24 ENCOUNTER — Encounter: Payer: Self-pay | Admitting: Physical Therapy

## 2019-09-24 ENCOUNTER — Ambulatory Visit: Payer: Medicaid Other | Admitting: Physical Therapy

## 2019-09-24 ENCOUNTER — Other Ambulatory Visit: Payer: Self-pay

## 2019-09-24 DIAGNOSIS — M533 Sacrococcygeal disorders, not elsewhere classified: Secondary | ICD-10-CM

## 2019-09-24 DIAGNOSIS — M6281 Muscle weakness (generalized): Secondary | ICD-10-CM

## 2019-09-24 DIAGNOSIS — R252 Cramp and spasm: Secondary | ICD-10-CM

## 2019-09-24 NOTE — Patient Instructions (Signed)
Access Code: [email or web address] URL: [email or web address] Date: 09/24/2019 Prepared by: Earlie Counts  Exercises Sidelying Diaphragmatic Breathing - 1 x daily - 7 x weekly - 1 sets - 10 reps Supine Hip Adduction Isometric with Ball - 2 x daily - 7 x weekly - 1 sets - 10 reps Quadruped Cat Camel - 1 x daily - 7 x weekly - 1 sets - 10 reps [facility] [address] [place], [place] [address] Phone # [phone] Fax [phone]

## 2019-09-24 NOTE — Therapy (Signed)
[facility] [address], [place], [place], [address] Phone: [phone]   Fax:  [phone]  Physical Therapy Treatment  Patient Details  Name: Alice Castillo MRN: [id number] Date of Birth: 08/08/1995 Referring Provider (PT): Dr. Darrol Poke   Encounter Date: 09/24/2019   PT End of Session - 09/24/19 1442    Visit Number 2    Date for PT Re-Evaluation 12/10/19    Authorization Type [place] Medicaid UHC    Authorization - Visit Number 2    Authorization - Number of Visits 27    PT Start Time 1400    PT Stop Time 1440    PT Time Calculation (min) 40 min    Activity Tolerance Patient tolerated treatment well;No increased pain    Behavior During Therapy WFL for tasks assessed/performed           Past Medical History:  Diagnosis Date  . Anxiety   . Bipolar 1 disorder ([place])   . Chlamydia infection   . Depression   . 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 Headache   . Supervision of high risk pregnancy, antepartum 04/13/2019    Nursing Staff Provider Office Location [place] Dating  LMP c/w 30wk [place] Language  english Anatomy US  Normal Flu Vaccine  declined Genetic Screen  NIPS:   AFP:   First Screen:  Quad:   TDaP vaccine  04/13/2019 Hgb A1C or  GTT 2hr GTT normal  Rhogam  04/06/2019   LAB RESULTS  Feeding Plan Breast Blood Type O/Negative/-- (02/15 1414)  Contraception Depo Antibody Positive, See Final Results (02/15 1414    Past Surgical History:  Procedure Laterality Date  . NO PAST SURGERIES      There were no vitals filed for this visit.   Subjective Assessment - 09/24/19 1402    Subjective The day after the pelvic floor exam I had increased pain.    Patient Stated Goals reduce coccyx pain    Currently in Pain? No/denies              [facility] PT Assessment - 09/24/19 0001      Palpation   SI assessment  right ilium anteriorly rotated                          [facility] Adult PT Treatment/Exercise -  09/24/19 0001      Lumbar Exercises: Supine   Ab Set 10 reps    AB Set Limitations with ball squeeze, working the abdominals and tactile cues to the lower abdominals to contract      Lumbar Exercises: Sidelying   Other Sidelying Lumbar Exercises laying on side with opening of the lateral rib cage with breath and manual mobilization of the rib cage and opening the rib angle      Lumbar Exercises: Quadruped   Mad[place] 10 reps    Madcat/Old Horse Limitations with elongation of the lumbar spine      Manual Therapy   Manual Therapy Soft tissue mobilization;Myofascial release    Soft tissue mobilization right gluteal, rotators, quadratus, right abdominals    Myofascial Release using a suction cup to release the fascial of the gluteal, lateral abdominals, and right lumbar                  PT Education - 09/24/19 1442    Education Details Access Code: [email or web address]    Methods Explanation;Demonstration;Verbal cues;Handout    Comprehension Returned demonstration;Verbalized  understanding            PT Short Term Goals - 09/17/19 1551      PT SHORT TERM GOAL #1   Title independent with initial HEP    Time 4    Period Weeks    Status New    Target Date 10/15/19      PT SHORT TERM GOAL #2   Title coccyx pain with sitting decreased >/= 25%    Time 4    Period Weeks    Status New    Target Date 10/15/19             PT Long Term Goals - 09/17/19 1552      PT LONG TERM GOAL #1   Title independent with advanced HEP    Time 12    Period Weeks    Status New    Target Date 12/10/19      PT LONG TERM GOAL #2   Title able to sit for 1 hour with pain decreased >/= 75% due to elongation of tissue and increased movement of the coccyx    Time 12    Period Weeks    Status New    Target Date 12/10/19      PT LONG TERM GOAL #3   Title go from sit to stand with pain decreased >/= 75% due to pelvis in correct alignment    Time 12    Period Weeks    Status New     Target Date 12/10/19      PT LONG TERM GOAL #4   Title able to have a bowel movement without coccyx pain    Time 12    Period Weeks    Status New    Target Date 12/10/19                 Plan - 09/24/19 1406    Clinical Impression Statement Patient has trigger points in the right quadratus, right abdominals, and right buttocks. After manual work her pelvis was in correct alignment. Patient is able to engage her abdominals correctly with exercise. Patient has tightness in the lumbar with cat cow. Patient will benefit from skilled therapy to improve coordination of the pelvic floor and elongation of the pelvic floor to reduce coccyx pain.    Personal Factors and Comorbidities Comorbidity 3+;Sex    Comorbidities Bipolar, PTSD; Mild cystocele and rectocele    Examination-Activity Limitations Continence;Sit;Transfers;Toileting    Examination-Participation Restrictions Driving    Stability/Clinical Decision Making Evolving/Moderate complexity    Rehab Potential Excellent    PT Frequency 1x / week    PT Duration 12 weeks    PT Treatment/Interventions ADLs/Self Care Home Management;Biofeedback;Cryotherapy;Electrical Stimulation;Moist Heat;Ultrasound;Neuromuscular re-education;Therapeutic exercise;Therapeutic activities;Patient/family education;Manual techniques;Dry needling;Scar mobilization;Spinal Manipulations    PT Next Visit Plan reverse clam, hip adductor with gluteal squeeze. dead bug, check coccyx pain, piriformis stretch    PT Home Exercise Plan Access Code: [email or web address]    Recommended Other Services MD signed initial eval    Consulted and Agree with Plan of Care Patient           Patient will benefit from skilled therapeutic intervention in order to improve the following deficits and impairments:  Decreased coordination, Decreased range of motion, Increased fascial restricitons, Pain, Decreased endurance, Decreased mobility, Decreased strength  Visit Diagnosis: Muscle weakness  (generalized)  Cramp and spasm  Coccyx pain     Problem List Patient Active Problem List   Diagnosis Date Noted  .  Post partum depression 08/27/2019  . Pelvic floor dysfunction 08/11/2019  . Post-dates pregnancy 07/10/2019  . Asymptomatic COVID-19 virus infection 07/10/2019  . Rubella non-immune status, antepartum 05/15/2019  . 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 Family history of genetic disorder 04/07/2015  . Rh negative state in antepartum period 03/22/2015  . Family history of clotting disorder 03/22/2015  . Paresthesias in antepartum period in third trimester 03/22/2015  . Anxiety during pregnancy, antepartum 01/24/2015  . PTSD (post-traumatic stress disorder) 09/22/2014  . Major depressive disorder, recurrent, severe with psychotic features ([employer]) 09/18/2014  . Hallucinations     Earlie Counts, PT 09/24/19 2:46 PM   [facility] [address] [place], [place], [address] Phone: [phone]   Fax:  [phone]  Name: Ninoshka Wainwright MRN: [id number] Date of Birth: 03/02/1995

## 2019-10-01 ENCOUNTER — Encounter: Payer: Self-pay | Admitting: Physical Therapy

## 2019-10-01 ENCOUNTER — Other Ambulatory Visit: Payer: Self-pay

## 2019-10-01 ENCOUNTER — Ambulatory Visit: Payer: Medicaid Other | Attending: Certified Nurse Midwife | Admitting: Physical Therapy

## 2019-10-01 DIAGNOSIS — M533 Sacrococcygeal disorders, not elsewhere classified: Secondary | ICD-10-CM | POA: Insufficient documentation

## 2019-10-01 DIAGNOSIS — R252 Cramp and spasm: Secondary | ICD-10-CM | POA: Diagnosis present

## 2019-10-01 DIAGNOSIS — M6281 Muscle weakness (generalized): Secondary | ICD-10-CM | POA: Insufficient documentation

## 2019-10-01 NOTE — Therapy (Signed)
[facility] [address] [place], [place], [address] Phone: [phone]   Fax:  [phone]  Physical Therapy Treatment  Patient Details  Name: Alice Castillo MRN: [id number] Date of Birth: 03/23/1995 Referring Provider (PT): Dr. Darrol Poke   Encounter Date: 10/01/2019   PT End of Session - 10/01/19 1444    Visit Number 3    Date for PT Re-Evaluation 12/10/19    Authorization Type [place] Medicaid UHC    Authorization - Visit Number 3    Authorization - Number of Visits 27    PT Start Time 1400    PT Stop Time 1500    PT Time Calculation (min) 60 min    Activity Tolerance Patient tolerated treatment well;Patient limited by pain    Behavior During Therapy [facility] for tasks assessed/performed           Past Medical History:  Diagnosis Date  . Anxiety   . Bipolar 1 disorder ([place])   . Chlamydia infection   . Depression   . 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 Headache   . Supervision of high risk pregnancy, antepartum 04/13/2019    Nursing Staff Provider Office Location [place] Dating  LMP c/w 30wk [place] Language  english Anatomy US  Normal Flu Vaccine  declined Genetic Screen  NIPS:   AFP:   First Screen:  Quad:   TDaP vaccine  04/13/2019 Hgb A1C or  GTT 2hr GTT normal  Rhogam  04/06/2019   LAB RESULTS  Feeding Plan Breast Blood Type O/Negative/-- (02/15 1414)  Contraception Depo Antibody Positive, See Final Results (02/15 1414    Past Surgical History:  Procedure Laterality Date  . NO PAST SURGERIES      There were no vitals filed for this visit.   Subjective Assessment - 10/01/19 1404    Subjective I have felt bad this past week. I  have not left the house. The pain in the tailbone.    Patient Stated Goals reduce coccyx pain    Currently in Pain? Yes    Pain Score 8     Pain Location Coccyx    Pain Orientation Mid    Pain Descriptors / Indicators Stabbing    Pain Type Acute pain    Pain Onset More than a month ago     Pain Frequency Intermittent    Aggravating Factors  sitting, after sitting and move, will feel a pop, sometimes with bowel movement ,when constipated, laying on back    Pain Relieving Factors sit bent forward to be off tailbone, liquid diet to avoid constipation; standing    Multiple Pain Sites No              [facility] PT Assessment - 10/01/19 0001      Palpation   SI assessment  right ilium anteriorly rotated       Pelvic Dictraction   Findings Positive    Side  Right    Comment pain                         [facility] Adult PT Treatment/Exercise - 10/01/19 0001      Modalities   Modalities Electrical Stimulation      Moist Heat Therapy   Number Minutes Moist Heat --    Moist Heat Location --      Electrical Stimulation   Electrical Stimulation Location lumbar    Electrical Stimulation Action IFC    Electrical Stimulation  Parameters 20 min. to pateint tolerance    Electrical Stimulation Goals Pain;Tone      Manual Therapy   Manual Therapy Soft tissue mobilization;Joint mobilization    Joint Mobilization PA and rotational glides L1-L5  grade III in prone and sideglide to L3 from left to right    Soft tissue mobilization bilateral obturator internist, lumbar paraspinals, right gluteal, piriformis; coccygeus right with hip IR, ER to elongate the muscle; trigger point release to the muscles in quadratus and gluteus with 90 sec pressure point    Muscle Energy Technique correct ilium                    PT Short Term Goals - 10/01/19 1447      PT SHORT TERM GOAL #1   Title independent with initial HEP    Time 4    Period Weeks    Status On-going      PT SHORT TERM GOAL #2   Title coccyx pain with sitting decreased >/= 25%    Time 4    Period Weeks    Status On-going             PT Long Term Goals - 09/17/19 1552      PT LONG TERM GOAL #1   Title independent with advanced HEP    Time 12    Period Weeks    Status New    Target Date  12/10/19      PT LONG TERM GOAL #2   Title able to sit for 1 hour with pain decreased >/= 75% due to elongation of tissue and increased movement of the coccyx    Time 12    Period Weeks    Status New    Target Date 12/10/19      PT LONG TERM GOAL #3   Title go from sit to stand with pain decreased >/= 75% due to pelvis in correct alignment    Time 12    Period Weeks    Status New    Target Date 12/10/19      PT LONG TERM GOAL #4   Title able to have a bowel movement without coccyx pain    Time 12    Period Weeks    Status New    Target Date 12/10/19                 Plan - 10/01/19 1445    Clinical Impression Statement Patient is having increased in coccyx pain to 7/10. She does not know what has caused the increased in pain. At the end of treatment there was no pain. The right ilium and sacrum was rotated. Patient pelvis was in correct alignment after therapy. She had trigger points in the quadratus and right gluteal. After therapy the pain decreased. Paient will benefit from skilled therapy to improve coordination of the pelvic floor and elongation fo the pelvic muscles to reduce the coccyx pain.    Personal Factors and Comorbidities Comorbidity 3+;Sex    Comorbidities Bipolar, PTSD; Mild cystocele and rectocele    Examination-Activity Limitations Continence;Sit;Transfers;Toileting    Examination-Participation Restrictions Driving    Stability/Clinical Decision Making Evolving/Moderate complexity    Rehab Potential Excellent    PT Frequency 1x / week    PT Duration 12 weeks    PT Treatment/Interventions ADLs/Self Care Home Management;Biofeedback;Cryotherapy;Electrical Stimulation;Moist Heat;Ultrasound;Neuromuscular re-education;Therapeutic exercise;Therapeutic activities;Patient/family education;Manual techniques;Dry needling;Scar mobilization;Spinal Manipulations    PT Next Visit Plan reverse clam, hip adductor with gluteal squeeze.  dead bug, check coccyx pain, piriformis  stretch; assess pain and pelvic alignment    PT Home Exercise Plan Access Code: [email or web address]    Consulted and Agree with Plan of Care Patient           Patient will benefit from skilled therapeutic intervention in order to improve the following deficits and impairments:  Decreased coordination, Decreased range of motion, Increased fascial restricitons, Pain, Decreased endurance, Decreased mobility, Decreased strength  Visit Diagnosis: Muscle weakness (generalized)  Cramp and spasm  Coccyx pain     Problem List Patient Active Problem List   Diagnosis Date Noted  . Post partum depression 08/27/2019  . Pelvic floor dysfunction 08/11/2019  . Post-dates pregnancy 07/10/2019  . Asymptomatic COVID-19 virus infection 07/10/2019  . Rubella non-immune status, antepartum 05/15/2019  . 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 Family history of genetic disorder 04/07/2015  . Rh negative state in antepartum period 03/22/2015  . Family history of clotting disorder 03/22/2015  . Paresthesias in antepartum period in third trimester 03/22/2015  . Anxiety during pregnancy, antepartum 01/24/2015  . PTSD (post-traumatic stress disorder) 09/22/2014  . Major depressive disorder, recurrent, severe with psychotic features ([place]) 09/18/2014  . Hallucinations     Earlie Counts, PT 10/01/19 3:11 PM   [facility] [address], [place], [place], [address] Phone: [phone]   Fax:  [phone]  Name: Alice Castillo MRN: [id number] Date of Birth: [birth year]

## 2019-10-02 ENCOUNTER — Ambulatory Visit: Payer: Medicaid Other

## 2019-10-05 ENCOUNTER — Other Ambulatory Visit: Payer: Self-pay

## 2019-10-05 ENCOUNTER — Ambulatory Visit (INDEPENDENT_AMBULATORY_CARE_PROVIDER_SITE_OTHER): Payer: Medicaid Other | Admitting: *Deleted

## 2019-10-05 VITALS — BP 97/73 | HR 73

## 2019-10-05 DIAGNOSIS — Z3042 Encounter for surveillance of injectable contraceptive: Secondary | ICD-10-CM

## 2019-10-05 MED ORDER — MEDROXYPROGESTERONE ACETATE 150 MG/ML IM SUSP
150.0000 mg | Freq: Once | INTRAMUSCULAR | Status: AC
  Administered 2019-10-05: 150 mg via INTRAMUSCULAR

## 2019-10-05 NOTE — Progress Notes (Signed)
Here for depo-provera injection. Asked to hold icepack during shot- states helps her to not pass out. Given ice pack and had her to lie on exam table. Tolerated shot well with mild lightheadedness that resolved quickly with ice pack, cool cloth and lying on table. Had her lie on table and then sit until all lightheadedness resolved. Will make next appointment at checkout. Flora Ratz,RN

## 2019-10-06 ENCOUNTER — Telehealth (HOSPITAL_COMMUNITY): Payer: Medicaid Other

## 2019-10-06 NOTE — Progress Notes (Signed)
Patient was assessed and managed by nursing staff during this encounter. I have reviewed the chart and agree with the documentation and plan. I have also made any necessary editorial changes.  Aletha Halim, MD 10/06/2019 7:37 AM

## 2019-10-15 ENCOUNTER — Ambulatory Visit: Payer: Medicaid Other | Admitting: Physical Therapy

## 2019-10-15 ENCOUNTER — Other Ambulatory Visit: Payer: Self-pay

## 2019-10-15 ENCOUNTER — Encounter: Payer: Self-pay | Admitting: Physical Therapy

## 2019-10-15 DIAGNOSIS — M6281 Muscle weakness (generalized): Secondary | ICD-10-CM | POA: Diagnosis not present

## 2019-10-15 DIAGNOSIS — M533 Sacrococcygeal disorders, not elsewhere classified: Secondary | ICD-10-CM

## 2019-10-15 DIAGNOSIS — R252 Cramp and spasm: Secondary | ICD-10-CM

## 2019-10-15 NOTE — Patient Instructions (Signed)
Access Code: [email or web address] URL: [email or web address] Date: 10/15/2019 Prepared by: Earlie Counts  Exercises Supine Hip Adduction Isometric with Diona Foley - 2 x daily - 7 x weekly - 1 sets - 10 reps Sidelying Reverse Clamshell - 1 x daily - 7 x weekly - 2 sets - 10 reps Prone Hip Extension with Pillow Under Abdomen - 1 x daily - 7 x weekly - 2 sets - 10 reps [facility] [address] [place], [place] [address] Phone # [phone] Fax [phone]

## 2019-10-15 NOTE — Therapy (Signed)
[facility] [address] [place], [place], [address] Phone: [phone]   Fax:  [phone]  Physical Therapy Treatment  Patient Details  Name: Alice Castillo MRN: [id number] Date of Birth: 09/19/1995 Referring Provider (PT): Dr. Darrol Poke   Encounter Date: 10/15/2019   PT End of Session - 10/15/19 1445    Visit Number 4    Date for PT Re-Evaluation 12/10/19    Authorization Type [place] Medicaid UHC    Authorization - Visit Number 4    Authorization - Number of Visits 27    PT Start Time 1400    PT Stop Time 1440    PT Time Calculation (min) 40 min    Activity Tolerance Patient tolerated treatment well;Patient limited by pain    Behavior During Therapy [facility] for tasks assessed/performed           Past Medical History:  Diagnosis Date  . Anxiety   . Bipolar 1 disorder ([place])   . Chlamydia infection   . Depression   . 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 Headache   . Supervision of high risk pregnancy, antepartum 04/13/2019    Nursing Staff Provider Office Location [place] Dating  LMP c/w 30wk [place] Language  english Anatomy US  Normal Flu Vaccine  declined Genetic Screen  NIPS:   AFP:   First Screen:  Quad:   TDaP vaccine  04/13/2019 Hgb A1C or  GTT 2hr GTT normal  Rhogam  04/06/2019   LAB RESULTS  Feeding Plan Breast Blood Type O/Negative/-- (02/15 1414)  Contraception Depo Antibody Positive, See Final Results (02/15 1414    Past Surgical History:  Procedure Laterality Date  . NO PAST SURGERIES      There were no vitals filed for this visit.   Subjective Assessment - 10/15/19 1402    Subjective Last week was bad and this week is better. I feel the coccyx is popping out of place.    Patient Stated Goals reduce coccyx pain    Currently in Pain? Yes    Pain Score 2     Pain Location Coccyx    Pain Orientation Mid    Pain Descriptors / Indicators Sharp   popping, bruised feeling   Pain Type Acute pain    Pain Onset  More than a month ago    Pain Frequency Intermittent    Aggravating Factors  sitting, after sitting and move, will feel a pop, sometimes with bowel movements, when constipated. laying on back, lean to side    Pain Relieving Factors sit and bend forward, standing, liquid diet to avoid    Multiple Pain Sites No                             [facility] Adult PT Treatment/Exercise - 10/15/19 0001      Lumbar Exercises: Supine   Ab Set 10 reps    AB Set Limitations with ball squeeze, working the abdominals and tactile cues to the lower abdominals to contract   50% squeeze   Other Supine Lumbar Exercises feet on the wall with ball between knees with 360 breathing then exhale with contraction of the abdominals, patient was able to demonstrate with this position correctly      Lumbar Exercises: Sidelying   Other Sidelying Lumbar Exercises 360 breathing with verbal cues and  tactile cues to work with expanding the rib cage then into the abdomen but very difficult  for her to return demonstration    Other Sidelying Lumbar Exercises reverse clam bil. 10x each side      Lumbar Exercises: Prone   Straight Leg Raise 20 reps;1 second    Straight Leg Raises Limitations squeeze buttocks first then lift leg      Lumbar Exercises: Quadruped   Other Quadruped Lumbar Exercises tried childs pose but did not help      Manual Therapy   Manual Therapy Joint mobilization    Joint Mobilization rotational mobilization to L4, L5 and S1                  PT Education - 10/15/19 1439    Education Details Access Code: [email or web address]    Person(s) Educated Patient    Methods Explanation;Demonstration;Verbal cues;Handout    Comprehension Verbalized understanding;Returned demonstration            PT Short Term Goals - 10/15/19 1448      PT SHORT TERM GOAL #1   Title independent with initial HEP    Time 4    Period Weeks    Status Achieved      PT SHORT TERM GOAL #2   Title coccyx pain with  sitting decreased >/= 25%    Baseline still having pain    Time 4    Period Weeks    Status On-going             PT Long Term Goals - 09/17/19 1552      PT LONG TERM GOAL #1   Title independent with advanced HEP    Time 12    Period Weeks    Status New    Target Date 12/10/19      PT LONG TERM GOAL #2   Title able to sit for 1 hour with pain decreased >/= 75% due to elongation of tissue and increased movement of the coccyx    Time 12    Period Weeks    Status New    Target Date 12/10/19      PT LONG TERM GOAL #3   Title go from sit to stand with pain decreased >/= 75% due to pelvis in correct alignment    Time 12    Period Weeks    Status New    Target Date 12/10/19      PT LONG TERM GOAL #4   Title able to have a bowel movement without coccyx pain    Time 12    Period Weeks    Status New    Target Date 12/10/19                 Plan - 10/15/19 1425    Clinical Impression Statement Patient is having trouble with 360 breathing in sidely but does better with supine position. Patient was able to do new exercises without coccyx pain and left therapy without pain. Patient has had trouble with managing the pain the past several weeks. Patient is able to engage the abdominals better with core bracing. Patient will benefit from skilled therapy to imporve coordination of the pelvic floor and elongation of the muscles.    Personal Factors and Comorbidities Comorbidity 3+;Sex    Comorbidities Bipolar, PTSD; Mild cystocele and rectocele    Examination-Activity Limitations Continence;Sit;Transfers;Toileting    Examination-Participation Restrictions Driving    Stability/Clinical Decision Making Evolving/Moderate complexity    Rehab Potential Excellent    PT Frequency 1x / week    PT Duration 12 weeks  PT Treatment/Interventions ADLs/Self Care Home Management;Biofeedback;Cryotherapy;Electrical Stimulation;Moist Heat;Ultrasound;Neuromuscular re-education;Therapeutic  exercise;Therapeutic activities;Patient/family education;Manual techniques;Dry needling;Scar mobilization;Spinal Manipulations    PT Next Visit Plan dead bug, check coccyx pain, piriformis stretch;  pain and pelvic alignment; serratus up the wall, hip hinge in tall kneel, squat to stand    PT Home Exercise Plan Access Code: [email or web address]    Consulted and Agree with Plan of Care Patient           Patient will benefit from skilled therapeutic intervention in order to improve the following deficits and impairments:  Decreased coordination, Decreased range of motion, Increased fascial restricitons, Pain, Decreased endurance, Decreased mobility, Decreased strength  Visit Diagnosis: Muscle weakness (generalized)  Cramp and spasm  Coccyx pain     Problem List Patient Active Problem List   Diagnosis Date Noted  . Post partum depression 08/27/2019  . Pelvic floor dysfunction 08/11/2019  . Post-dates pregnancy 07/10/2019  . Asymptomatic COVID-19 virus infection 07/10/2019  . Rubella non-immune status, antepartum 05/15/2019  . 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 Family history of genetic disorder 04/07/2015  . Rh negative state in antepartum period 03/22/2015  . Family history of clotting disorder 03/22/2015  . Paresthesias in antepartum period in third trimester 03/22/2015  . Anxiety during pregnancy, antepartum 01/24/2015  . PTSD (post-traumatic stress disorder) 09/22/2014  . Major depressive disorder, recurrent, severe with psychotic features ([place]) 09/18/2014  . Hallucinations     Earlie Counts, PT 10/15/19 2:49 PM   [facility] [address], [place], [place], [address] Phone: [phone]   Fax:  [phone]  Name: Alice Castillo MRN: [id number] Date of Birth: 19-Nov-1995

## 2019-10-22 ENCOUNTER — Ambulatory Visit: Payer: Medicaid Other | Admitting: Physical Therapy

## 2019-10-29 ENCOUNTER — Ambulatory Visit: Payer: Medicaid Other | Attending: Certified Nurse Midwife | Admitting: Physical Therapy

## 2019-10-29 ENCOUNTER — Encounter: Payer: Self-pay | Admitting: Physical Therapy

## 2019-10-29 ENCOUNTER — Other Ambulatory Visit: Payer: Self-pay

## 2019-10-29 DIAGNOSIS — R252 Cramp and spasm: Secondary | ICD-10-CM | POA: Diagnosis present

## 2019-10-29 DIAGNOSIS — M533 Sacrococcygeal disorders, not elsewhere classified: Secondary | ICD-10-CM | POA: Diagnosis present

## 2019-10-29 DIAGNOSIS — M6281 Muscle weakness (generalized): Secondary | ICD-10-CM | POA: Diagnosis not present

## 2019-10-29 NOTE — Therapy (Signed)
[facility] [address], [place], [place], [address] Phone: [phone]   Fax:  [phone]  Physical Therapy Treatment  Patient Details  Name: Alice Castillo MRN: [id number] Date of Birth: 1995-09-30 Referring Provider (PT): Dr. Darrol Poke   Encounter Date: 10/29/2019   PT End of Session - 10/29/19 1609    Visit Number 5    Date for PT Re-Evaluation 12/10/19    Authorization Type [place] Medicaid UHC    Authorization - Visit Number 5    Authorization - Number of Visits 27    PT Start Time 4709    PT Stop Time 6283    PT Time Calculation (min) 38 min    Activity Tolerance Patient tolerated treatment well;No increased pain    Behavior During Therapy WFL for tasks assessed/performed           Past Medical History:  Diagnosis Date  . Anxiety   . Bipolar 1 disorder ([place])   . Chlamydia infection   . Depression   . 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 Headache   . Supervision of high risk pregnancy, antepartum 04/13/2019    Nursing Staff Provider Office Location [place] Dating  LMP c/w 30wk [place] Language  english Anatomy US  Normal Flu Vaccine  declined Genetic Screen  NIPS:   AFP:   First Screen:  Quad:   TDaP vaccine  04/13/2019 Hgb A1C or  GTT 2hr GTT normal  Rhogam  04/06/2019   LAB RESULTS  Feeding Plan Breast Blood Type O/Negative/-- (02/15 1414)  Contraception Depo Antibody Positive, See Final Results (02/15 1414    Past Surgical History:  Procedure Laterality Date  . NO PAST SURGERIES      There were no vitals filed for this visit.   Subjective Assessment - 10/29/19 1533    Subjective Today the pain is not bad but I have been sitting much. I feel good the day of therapy but afterwards it begins to hurt.    Patient Stated Goals reduce coccyx pain    Currently in Pain? Yes    Pain Score 8    today is 2/10   Pain Location Coccyx    Pain Orientation Mid    Pain Descriptors / Indicators Sharp   popping and bruised  feeling   Pain Type Acute pain    Pain Onset More than a month ago    Pain Frequency Intermittent    Aggravating Factors  sitting, after sitting and move, will feel a pop, sometimes with bowel movement, when constipated, laying on back, lean to side    Pain Relieving Factors sit and bend forward, standing, liquid diet to void    Multiple Pain Sites No              [facility] PT Assessment - 10/29/19 0001      Palpation   SI assessment  ASIS are equal                         [facility] Adult PT Treatment/Exercise - 10/29/19 0001      Lumbar Exercises: Stretches   Piriformis Stretch Right;Left;1 rep;30 seconds      Lumbar Exercises: Supine   Dead Bug 20 reps;1 second    Dead Bug Limitations core engagement    Bridge 10 reps    Other Supine Lumbar Exercises supine back breathing to expand the low back      Lumbar Exercises: Prone  Other Prone Lumbar Exercises plank holding for 15 sec      Lumbar Exercises: Quadruped   Opposite Arm/Leg Raise Right arm/Left leg;Left arm/Right leg;10 reps      Manual Therapy   Manual Therapy Soft tissue mobilization    Soft tissue mobilization working on the diaphragm and expanding the rib angle to improve expansion of the lower abdomen                  PT Education - 10/29/19 1608    Education Details Access Code: [email or web address]    Person(s) Educated Patient    Methods Explanation;Demonstration;Handout    Comprehension Returned demonstration;Verbalized understanding            PT Short Term Goals - 10/15/19 1448      PT SHORT TERM GOAL #1   Title independent with initial HEP    Time 4    Period Weeks    Status Achieved      PT SHORT TERM GOAL #2   Title coccyx pain with sitting decreased >/= 25%    Baseline still having pain    Time 4    Period Weeks    Status On-going             PT Long Term Goals - 10/29/19 1612      PT LONG TERM GOAL #1   Title independent with advanced HEP    Time 12    Period Weeks     Status On-going      PT LONG TERM GOAL #2   Title able to sit for 1 hour with pain decreased >/= 75% due to elongation of tissue and increased movement of the coccyx    Time 12    Period Weeks    Status On-going      PT LONG TERM GOAL #3   Title go from sit to stand with pain decreased >/= 75% due to pelvis in correct alignment    Time 12    Period Weeks    Status On-going      PT LONG TERM GOAL #4   Title able to have a bowel movement without coccyx pain    Time 12    Period Weeks    Status On-going                 Plan - 10/29/19 1548    Clinical Impression Statement Patient has made a follow up appointment with the doctor to have the coccyx pain assessed further. Patient is able to expand her rib angle better with no pain now. Patient has learned  a new core exercise program she can do with her children. Patient will leak urine with sneezing. Patient is able to engage her core better and not dome the abdomen. Patient will benefit from skilled therapy to improve coordination of the pelvic floor and elongation of the muscles.    Personal Factors and Comorbidities Comorbidity 3+;Sex    Comorbidities Bipolar, PTSD; Mild cystocele and rectocele    Examination-Activity Limitations Continence;Sit;Transfers;Toileting    Stability/Clinical Decision Making Evolving/Moderate complexity    Rehab Potential Excellent    PT Frequency 1x / week    PT Duration 12 weeks    PT Treatment/Interventions ADLs/Self Care Home Management;Biofeedback;Cryotherapy;Electrical Stimulation;Moist Heat;Ultrasound;Neuromuscular re-education;Therapeutic exercise;Therapeutic activities;Patient/family education;Manual techniques;Dry needling;Scar mobilization;Spinal Manipulations    PT Next Visit Plan distraction of the coccyx outside pants; continue with core strength that she is able to do with her children like lunge, squat  PT Home Exercise Plan Access Code: [email or web address]    Consulted and Agree with Plan  of Care Patient           Patient will benefit from skilled therapeutic intervention in order to improve the following deficits and impairments:  Decreased coordination, Decreased range of motion, Increased fascial restricitons, Pain, Decreased endurance, Decreased mobility, Decreased strength  Visit Diagnosis: Muscle weakness (generalized)  Cramp and spasm  Coccyx pain     Problem List Patient Active Problem List   Diagnosis Date Noted  . Post partum depression 08/27/2019  . Pelvic floor dysfunction 08/11/2019  . Post-dates pregnancy 07/10/2019  . Asymptomatic COVID-19 virus infection 07/10/2019  . Rubella non-immune status, antepartum 05/15/2019  . 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 Family history of genetic disorder 04/07/2015  . Rh negative state in antepartum period 03/22/2015  . Family history of clotting disorder 03/22/2015  . Paresthesias in antepartum period in third trimester 03/22/2015  . Anxiety during pregnancy, antepartum 01/24/2015  . PTSD (post-traumatic stress disorder) 09/22/2014  . Major depressive disorder, recurrent, severe with psychotic features ([place]) 09/18/2014  . Hallucinations    Earlie Counts, PT 10/29/19 4:14 PM   [facility] [address], [place], [place], [address] Phone: [phone]   Fax:  [phone]  Name: Alice Castillo MRN: [id number] Date of Birth: [birth year]

## 2019-10-29 NOTE — Patient Instructions (Signed)
Access Code: [email or web address] URL: [email or web address] Date: 10/29/2019 Prepared by: Earlie Counts  Exercises Supine Piriformis Stretch Pulling Heel to Hip - 1 x daily - 7 x weekly - 1 sets - 2 reps - 30 sec hold Dead Bug - 1 x daily - 7 x weekly - 2 sets - 10 reps Standard Plank - 1 x daily - 7 x weekly - 1 sets - 2 reps - 30 sec hold Supine Bridge with Baby - 1 x daily - 7 x weekly - 1 sets - 10 reps Bird Dog with Baby - 1 x daily - 7 x weekly - 1 sets - 10 reps [facility] [address] [place], [place] [address] Phone # [phone] Fax [phone]

## 2019-11-05 ENCOUNTER — Ambulatory Visit: Payer: Medicaid Other | Admitting: Physical Therapy

## 2019-11-05 ENCOUNTER — Other Ambulatory Visit: Payer: Self-pay

## 2019-11-05 ENCOUNTER — Encounter: Payer: Self-pay | Admitting: Physical Therapy

## 2019-11-05 DIAGNOSIS — M6281 Muscle weakness (generalized): Secondary | ICD-10-CM

## 2019-11-05 DIAGNOSIS — M533 Sacrococcygeal disorders, not elsewhere classified: Secondary | ICD-10-CM

## 2019-11-05 DIAGNOSIS — R252 Cramp and spasm: Secondary | ICD-10-CM

## 2019-11-05 NOTE — Therapy (Signed)
[facility] [address] [place], [place], [address] Phone: [phone]   Fax:  [phone]  Physical Therapy Treatment  Patient Details  Name: Alice Castillo MRN: [id number] Date of Birth: 1995/02/28 Referring Provider (PT): Dr. Darrol Poke   Encounter Date: 11/05/2019   PT End of Session - 11/05/19 1524    Visit Number 6    Date for PT Re-Evaluation 12/10/19    Authorization Type [place] Medicaid UHC    Authorization - Visit Number 6    Authorization - Number of Visits 27    PT Start Time 3790    PT Stop Time 1523    PT Time Calculation (min) 38 min    Activity Tolerance Patient tolerated treatment well;No increased pain    Behavior During Therapy WFL for tasks assessed/performed           Past Medical History:  Diagnosis Date  . Anxiety   . Bipolar 1 disorder ([place])   . Chlamydia infection   . Depression   . 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 Headache   . Supervision of high risk pregnancy, antepartum 04/13/2019    Nursing Staff Provider Office Location [place] Dating  LMP c/w 30wk [place] Language  english Anatomy US  Normal Flu Vaccine  declined Genetic Screen  NIPS:   AFP:   First Screen:  Quad:   TDaP vaccine  04/13/2019 Hgb A1C or  GTT 2hr GTT normal  Rhogam  04/06/2019   LAB RESULTS  Feeding Plan Breast Blood Type O/Negative/-- (02/15 1414)  Contraception Depo Antibody Positive, See Final Results (02/15 1414    Past Surgical History:  Procedure Laterality Date  . NO PAST SURGERIES      There were no vitals filed for this visit.   Subjective Assessment - 11/05/19 1446    Subjective I was not able to exercise often. The pain is the same.    Patient Stated Goals reduce coccyx pain    Currently in Pain? Yes    Pain Score 7    low is 2/10   Pain Location Coccyx    Pain Orientation Mid    Pain Descriptors / Indicators Sharp   popping sound   Pain Type Acute pain    Pain Onset More than a month ago    Pain  Frequency Intermittent    Aggravating Factors  sitting, after sitting and move, will feel a pop, sometimes with a bowel movement, when constipated, laying on back , lean to one side    Multiple Pain Sites No                             [facility] Adult PT Treatment/Exercise - 11/05/19 0001      Lumbar Exercises: Standing   Other Standing Lumbar Exercises wall plank on forearms holding for 15 sec 3 times then will move arms up and down, able to engage the core and tuck tailbone in    Other Standing Lumbar Exercises mini lunge with tailbone tucked in and contracting the lower abdominals and working in the range that she has control      Lumbar Exercises: Supine   Dead Bug 15 reps;1 second    Dead Bug Limitations using red band with VC to contract the lower abdominals    Other Supine Lumbar Exercises supine with hips and knees 90/90 moving both legs 2 times then only on leg where sheis able to hold  an abdominal contraction      Lumbar Exercises: Prone   Other Prone Lumbar Exercises plank on knees and feet but both were difficult and trouble engaging the lower abdominals      Lumbar Exercises: Quadruped   Opposite Arm/Leg Raise Right arm/Left leg;Left arm/Right leg;10 reps;1 second    Opposite Arm/Leg Raise Limitations cues to contract the abdominal and work on control                  PT Education - 11/05/19 1521    Education Details Access Code: [email or web address]    Person(s) Educated Patient    Methods Explanation;Demonstration;Verbal cues;Handout    Comprehension Returned demonstration;Verbalized understanding            PT Short Term Goals - 11/05/19 1528      PT SHORT TERM GOAL #2   Title coccyx pain with sitting decreased >/= 25%    Baseline still having pain    Time 4    Period Weeks    Status On-going    Target Date 10/15/19             PT Long Term Goals - 11/05/19 1528      PT LONG TERM GOAL #1   Title independent with advanced HEP    Time 12     Period Weeks    Status On-going      PT LONG TERM GOAL #2   Title able to sit for 1 hour with pain decreased >/= 75% due to elongation of tissue and increased movement of the coccyx    Baseline still hasa coccyx pain    Time 12    Period Weeks    Status On-going      PT LONG TERM GOAL #3   Title go from sit to stand with pain decreased >/= 75% due to pelvis in correct alignment    Baseline still has coccyx pain    Time 12    Period Weeks    Status On-going      PT LONG TERM GOAL #4   Title able to have a bowel movement without coccyx pain    Baseline still has coccyx pain    Time 12    Period Weeks    Status On-going                 Plan - 11/05/19 1524    Clinical Impression Statement Patient is still having coccyx pain. Her coccyx pain will decreased when she engages her lower abdominals instead of pushing them outward. Patient has realized today the correct way to contract her abdominals with exercise. She is working on doing her exericses with her children to make it easier for her to accomplish them. Patient will benefit from skilled therapy to improve coordination of the pelvic floor and elongation of the muscles.    Personal Factors and Comorbidities Comorbidity 3+;Sex    Comorbidities Bipolar, PTSD; Mild cystocele and rectocele    Examination-Activity Limitations Continence;Sit;Transfers;Toileting    Examination-Participation Restrictions Driving    Stability/Clinical Decision Making Evolving/Moderate complexity    Rehab Potential Excellent    PT Frequency 1x / week    PT Duration 12 weeks    PT Treatment/Interventions ADLs/Self Care Home Management;Biofeedback;Cryotherapy;Electrical Stimulation;Moist Heat;Ultrasound;Neuromuscular re-education;Therapeutic exercise;Therapeutic activities;Patient/family education;Manual techniques;Dry needling;Scar mobilization;Spinal Manipulations    PT Next Visit Plan continue to work on core with engaging the lower abdominal,  review exercises and see if they can be advanced; work on squat for body mechanics  PT Home Exercise Plan Access Code: [email or web address]    Consulted and Agree with Plan of Care Patient           Patient will benefit from skilled therapeutic intervention in order to improve the following deficits and impairments:  Decreased coordination, Decreased range of motion, Increased fascial restricitons, Pain, Decreased endurance, Decreased mobility, Decreased strength  Visit Diagnosis: Muscle weakness (generalized)  Cramp and spasm  Coccyx pain     Problem List Patient Active Problem List   Diagnosis Date Noted  . Post partum depression 08/27/2019  . Pelvic floor dysfunction 08/11/2019  . Post-dates pregnancy 07/10/2019  . Asymptomatic COVID-19 virus infection 07/10/2019  . Rubella non-immune status, antepartum 05/15/2019  . 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 Family history of genetic disorder 04/07/2015  . Rh negative state in antepartum period 03/22/2015  . Family history of clotting disorder 03/22/2015  . Paresthesias in antepartum period in third trimester 03/22/2015  . Anxiety during pregnancy, antepartum 01/24/2015  . PTSD (post-traumatic stress disorder) 09/22/2014  . Major depressive disorder, recurrent, severe with psychotic features ([place]) 09/18/2014  . Hallucinations     Earlie Counts, PT 11/05/19 3:30 PM   [facility] [address] [place], [place], [address] Phone: [phone]   Fax:  [phone]  Name: Alice Castillo MRN: [id number] Date of Birth: 04/13/1995

## 2019-11-05 NOTE — Patient Instructions (Signed)
Access Code: [email or web address] URL: [email or web address] Date: 11/05/2019 Prepared by: Earlie Counts  Exercises Supine Piriformis Stretch Pulling Heel to Hip - 1 x daily - 4 x weekly - 1 sets - 2 reps - 30 sec hold Dead Bug - 1 x daily - 4 x weekly - 2 sets - 10 reps Bird Dog with Baby - 1 x daily - 4 x weekly - 1 sets - 10 reps Forearm Plank on Wall - 1 x daily - 4 x weekly - 1 sets - 10 reps Mini Lunge - 1 x daily - 4 x weekly - 1 sets - 10 reps [facility] [address] [place], [place] [address] Phone # [phone] Fax [phone]

## 2019-11-12 ENCOUNTER — Encounter: Payer: Medicaid Other | Admitting: Physical Therapy

## 2019-11-19 ENCOUNTER — Other Ambulatory Visit: Payer: Self-pay

## 2019-11-19 ENCOUNTER — Ambulatory Visit: Payer: Medicaid Other | Admitting: Physical Therapy

## 2019-11-19 ENCOUNTER — Encounter: Payer: Self-pay | Admitting: Physical Therapy

## 2019-11-19 DIAGNOSIS — M533 Sacrococcygeal disorders, not elsewhere classified: Secondary | ICD-10-CM

## 2019-11-19 DIAGNOSIS — M6281 Muscle weakness (generalized): Secondary | ICD-10-CM | POA: Diagnosis not present

## 2019-11-19 DIAGNOSIS — R252 Cramp and spasm: Secondary | ICD-10-CM

## 2019-11-19 NOTE — Therapy (Signed)
[facility] [address], [place], [place], [address] Phone: [phone]   Fax:  [phone]  Physical Therapy Treatment  Patient Details  Name: Alice Castillo MRN: [id number] Date of Birth: 1995-05-30 Referring Provider (PT): Dr. Darrol Poke   Encounter Date: 11/19/2019   PT End of Session - 11/19/19 1315    Visit Number 7    Date for PT Re-Evaluation 12/10/19    Authorization Type [place] Medicaid UHC    Authorization - Visit Number 7    Authorization - Number of Visits 27    PT Start Time 2952    PT Stop Time 8413    PT Time Calculation (min) 40 min    Activity Tolerance Patient tolerated treatment well;No increased pain    Behavior During Therapy WFL for tasks assessed/performed           Past Medical History:  Diagnosis Date  . Anxiety   . Bipolar 1 disorder ([place])   . Chlamydia infection   . Depression   . 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 Headache   . Supervision of high risk pregnancy, antepartum 04/13/2019    Nursing Staff Provider Office Location [place] Dating  LMP c/w 30wk [place] Language  english Anatomy US  Normal Flu Vaccine  declined Genetic Screen  NIPS:   AFP:   First Screen:  Quad:   TDaP vaccine  04/13/2019 Hgb A1C or  GTT 2hr GTT normal  Rhogam  04/06/2019   LAB RESULTS  Feeding Plan Breast Blood Type O/Negative/-- (02/15 1414)  Contraception Depo Antibody Positive, See Final Results (02/15 1414    Past Surgical History:  Procedure Laterality Date  . NO PAST SURGERIES      There were no vitals filed for this visit.   Subjective Assessment - 11/19/19 1236    Subjective The pain is not too bad especially the past week. Pain is isolated to the coccyx and does not go down my legs anymore.    Patient Stated Goals reduce coccyx pain    Currently in Pain? Yes    Pain Score 4     Pain Location Coccyx    Pain Orientation Mid    Pain Descriptors / Indicators Sharp    Pain Type Acute pain    Pain  Onset More than a month ago    Pain Frequency Intermittent    Aggravating Factors  sitting, go from sitting to standing, twisting when laying back    Pain Relieving Factors rub the area    Multiple Pain Sites No                             [facility] Adult PT Treatment/Exercise - 11/19/19 0001      Self-Care   Self-Care Other Self-Care Comments    Other Self-Care Comments  instructed patient on model on how to perform perineal massage  to improve the scar tissue      Lumbar Exercises: Standing   Other Standing Lumbar Exercises wall plank on forearms holding for 15 sec 3 times then will move arms up and down, able to engage the core and tuck tailbone in    Other Standing Lumbar Exercises mini lunge with tailbone tucked in and contracting the lower abdominals and working in the range that she has control      Lumbar Exercises: Supine   Dead Bug 15 reps;1 second      Lumbar Exercises: Quadruped  Straight Leg Raise 10 reps    Straight Leg Raises Limitations right while keeping pelvis leveled, sliding foot out keeping control    Opposite Arm/Leg Raise Right arm/Left leg;10 reps      Manual Therapy   Manual Therapy Joint mobilization;Soft tissue mobilization    Joint Mobilization rotational mobilization to L4, L5 and S1; right sacral mobilization    Soft tissue mobilization gentle soft tissue work to the right SI joint, gluteal and coccygeus                  PT Education - 11/19/19 1314    Education Details Access Code: [email or web address]    Person(s) Educated Patient    Methods Explanation;Demonstration;Verbal cues;Handout    Comprehension Returned demonstration;Verbalized understanding            PT Short Term Goals - 11/19/19 1238      PT SHORT TERM GOAL #1   Title independent with initial HEP    Time 4    Period Weeks    Status Achieved      PT SHORT TERM GOAL #2   Title coccyx pain with sitting decreased >/= 25%    Time 4    Period Weeks    Status  Achieved             PT Long Term Goals - 11/19/19 1239      PT LONG TERM GOAL #1   Title independent with advanced HEP    Time 12    Period Weeks    Status On-going      PT LONG TERM GOAL #2   Title able to sit for 1 hour with pain decreased >/= 75% due to elongation of tissue and increased movement of the coccyx    Baseline reduced by 25%    Time 12    Period Weeks    Status On-going      PT LONG TERM GOAL #3   Title go from sit to stand with pain decreased >/= 75% due to pelvis in correct alignment    Baseline reduced by 25%    Time 12    Period Weeks    Status On-going      PT LONG TERM GOAL #4   Title able to have a bowel movement without coccyx pain    Time 12    Period Weeks    Status Achieved                 Plan - 11/19/19 1316    Clinical Impression Statement Patient had no coccyx pain at the end of treatment. Patient has less coccyx popping. Patient pain is isolating to the coccyx instead of the legs too. Patient has difficulty to to lift right leg and needs VC to not drop the right hip. Patient reports her pain si 25% better. Patient will benefit from skilled therapy to improve coordination of the pelvic floor and elongation of the muscles.    Personal Factors and Comorbidities Comorbidity 3+;Sex    Comorbidities Bipolar, PTSD; Mild cystocele and rectocele    Examination-Activity Limitations Continence;Sit;Transfers;Toileting    Stability/Clinical Decision Making Evolving/Moderate complexity    Rehab Potential Excellent    PT Frequency 1x / week    PT Duration 12 weeks    PT Treatment/Interventions ADLs/Self Care Home Management;Biofeedback;Cryotherapy;Electrical Stimulation;Moist Heat;Ultrasound;Neuromuscular re-education;Therapeutic exercise;Therapeutic activities;Patient/family education;Manual techniques;Dry needling;Scar mobilization;Spinal Manipulations    PT Next Visit Plan continue to work on core with engaging the lower abdominal, review  exercises and  see if they can be advanced; work on squat for body mechanics    PT Home Exercise Plan Access Code: [email or web address]    Consulted and Agree with Plan of Care Patient           Patient will benefit from skilled therapeutic intervention in order to improve the following deficits and impairments:  Decreased coordination, Decreased range of motion, Increased fascial restricitons, Pain, Decreased endurance, Decreased mobility, Decreased strength  Visit Diagnosis: Muscle weakness (generalized)  Cramp and spasm  Coccyx pain     Problem List Patient Active Problem List   Diagnosis Date Noted  . Post partum depression 08/27/2019  . Pelvic floor dysfunction 08/11/2019  . Post-dates pregnancy 07/10/2019  . Asymptomatic COVID-19 virus infection 07/10/2019  . Rubella non-immune status, antepartum 05/15/2019  . 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 Family history of genetic disorder 04/07/2015  . Rh negative state in antepartum period 03/22/2015  . Family history of clotting disorder 03/22/2015  . Paresthesias in antepartum period in third trimester 03/22/2015  . Anxiety during pregnancy, antepartum 01/24/2015  . PTSD (post-traumatic stress disorder) 09/22/2014  . Major depressive disorder, recurrent, severe with psychotic features ([place]) 09/18/2014  . Hallucinations     Earlie Counts, PT 11/19/19 1:20 PM   [facility] [address] [place], [place], [address] Phone: [phone]   Fax:  [phone]  Name: Alice Castillo MRN: [id number] Date of Birth: Apr 02, 1995

## 2019-11-19 NOTE — Patient Instructions (Signed)
Access Code: [email or web address] URL: [email or web address] Date: 11/19/2019 Prepared by: Earlie Counts  Exercises Supine Piriformis Stretch Pulling Heel to Hip - 1 x daily - 4 x weekly - 1 sets - 2 reps - 30 sec hold Dead Bug - 1 x daily - 4 x weekly - 2 sets - 10 reps Bird Dog with Baby - 1 x daily - 4 x weekly - 1 sets - 10 reps Forearm Plank on Wall - 1 x daily - 4 x weekly - 1 sets - 10 reps Mini Lunge - 1 x daily - 4 x weekly - 1 sets - 10 reps [facility] [address] [place], [place] [address] Phone # [phone] Fax [phone]

## 2019-11-26 ENCOUNTER — Ambulatory Visit: Payer: Medicaid Other | Admitting: Physical Therapy

## 2019-12-03 ENCOUNTER — Encounter: Payer: Self-pay | Admitting: Physical Therapy

## 2019-12-03 ENCOUNTER — Ambulatory Visit: Payer: Medicaid Other | Attending: Certified Nurse Midwife | Admitting: Physical Therapy

## 2019-12-03 ENCOUNTER — Other Ambulatory Visit: Payer: Self-pay

## 2019-12-03 DIAGNOSIS — R252 Cramp and spasm: Secondary | ICD-10-CM | POA: Diagnosis present

## 2019-12-03 DIAGNOSIS — M533 Sacrococcygeal disorders, not elsewhere classified: Secondary | ICD-10-CM | POA: Insufficient documentation

## 2019-12-03 DIAGNOSIS — M6281 Muscle weakness (generalized): Secondary | ICD-10-CM | POA: Insufficient documentation

## 2019-12-03 NOTE — Therapy (Signed)
[facility] [address] [place], [place], [address] Phone: [phone]   Fax:  [phone]  Physical Therapy Treatment  Patient Details  Name: Alice Castillo MRN: [id number] Date of Birth: 10-31-1995 Referring Provider (PT): Dr. Darrol Poke   Encounter Date: 12/03/2019   PT End of Session - 12/03/19 1611    Visit Number 8    Date for PT Re-Evaluation 12/10/19    Authorization Type [place] Medicaid UHC    Authorization - Visit Number 8    Authorization - Number of Visits 27    PT Start Time 6503   walked to therapy in the rain so delayed   PT Stop Time 1613    PT Time Calculation (min) 33 min    Activity Tolerance Patient tolerated treatment well;No increased pain    Behavior During Therapy WFL for tasks assessed/performed           Past Medical History:  Diagnosis Date  . Anxiety   . Bipolar 1 disorder ([place])   . Chlamydia infection   . Depression   . 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 Headache   . Supervision of high risk pregnancy, antepartum 04/13/2019    Nursing Staff Provider Office Location [place] Dating  LMP c/w 30wk [place] Language  english Anatomy US  Normal Flu Vaccine  declined Genetic Screen  NIPS:   AFP:   First Screen:  Quad:   TDaP vaccine  04/13/2019 Hgb A1C or  GTT 2hr GTT normal  Rhogam  04/06/2019   LAB RESULTS  Feeding Plan Breast Blood Type O/Negative/-- (02/15 1414)  Contraception Depo Antibody Positive, See Final Results (02/15 1414    Past Surgical History:  Procedure Laterality Date  . NO PAST SURGERIES      There were no vitals filed for this visit.   Subjective Assessment - 12/03/19 1542    Subjective The pain is better and intermittent. Pressure with stinging. Pain does not last long.    Patient Stated Goals reduce coccyx pain    Currently in Pain? Yes    Pain Score 4     Pain Location Coccyx    Pain Orientation Mid    Pain Descriptors / Indicators Sharp    Pain Type Acute pain     Pain Onset More than a month ago    Pain Frequency Intermittent    Aggravating Factors  sitting, go from sitting to standing, twisting when laying on back    Pain Relieving Factors rub the area    Multiple Pain Sites No                             [facility] Adult PT Treatment/Exercise - 12/03/19 0001      Lumbar Exercises: Supine   Other Supine Lumbar Exercises supine with knees at 90/90 engaging the abdominals, and moving knees foreard and backward      Lumbar Exercises: Prone   Other Prone Lumbar Exercises plank on knees with reducing lumbar lordosis and reducing the thoracic kyphosis; tried a push up but had trouble keep core engaged       Manual Therapy   Manual Therapy Joint mobilization;Soft tissue mobilization    Joint Mobilization sacral mobilization to correct left rotation    Soft tissue mobilization using the addady to massge bilateral gluteal and SI joint  PT Short Term Goals - 11/19/19 1238      PT SHORT TERM GOAL #1   Title independent with initial HEP    Time 4    Period Weeks    Status Achieved      PT SHORT TERM GOAL #2   Title coccyx pain with sitting decreased >/= 25%    Time 4    Period Weeks    Status Achieved             PT Long Term Goals - 12/03/19 1616      PT LONG TERM GOAL #2   Title able to sit for 1 hour with pain decreased >/= 75% due to elongation of tissue and increased movement of the coccyx    Baseline reduced by 25%    Time 12    Period Weeks    Status On-going      PT LONG TERM GOAL #3   Title go from sit to stand with pain decreased >/= 75% due to pelvis in correct alignment    Time 12    Period Weeks    Status On-going      PT LONG TERM GOAL #4   Title able to have a bowel movement without coccyx pain    Baseline still has coccyx pain    Time 12    Period Weeks    Status Achieved                 Plan - 12/03/19 1545    Clinical Impression Statement Patient reports  she is having less coccyx pain that last a smaller period of time. Patient has trouble doing a plank due to not able to keep a neutral spine and increases her thoracic kyphosis. Patient sacrum was rotated but afterwards was in correct alignment. Patient will benefit from skilled therapy to improve coordination of the pelvic floor and elongation of the muscles.    Personal Factors and Comorbidities Comorbidity 3+;Sex    Comorbidities Bipolar, PTSD; Mild cystocele and rectocele    Examination-Activity Limitations Continence;Sit;Transfers;Toileting    Examination-Participation Restrictions Driving    Stability/Clinical Decision Making Evolving/Moderate complexity    Rehab Potential Excellent    PT Frequency 1x / week    PT Duration 12 weeks    PT Treatment/Interventions ADLs/Self Care Home Management;Biofeedback;Cryotherapy;Electrical Stimulation;Moist Heat;Ultrasound;Neuromuscular re-education;Therapeutic exercise;Therapeutic activities;Patient/family education;Manual techniques;Dry needling;Scar mobilization;Spinal Manipulations    PT Next Visit Plan see what MD says; possible discharge; body mechainics, check on plank    PT Home Exercise Plan Access Code: [email or web address]    Consulted and Agree with Plan of Care Patient           Patient will benefit from skilled therapeutic intervention in order to improve the following deficits and impairments:  Decreased coordination, Decreased range of motion, Increased fascial restricitons, Pain, Decreased endurance, Decreased mobility, Decreased strength  Visit Diagnosis: Muscle weakness (generalized)  Cramp and spasm  Coccyx pain     Problem List Patient Active Problem List   Diagnosis Date Noted  . Post partum depression 08/27/2019  . Pelvic floor dysfunction 08/11/2019  . Post-dates pregnancy 07/10/2019  . Asymptomatic COVID-19 virus infection 07/10/2019  . Rubella non-immune status, antepartum 05/15/2019  . 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 Family history of genetic disorder 04/07/2015  . Rh negative state in antepartum period 03/22/2015  . Family history of clotting disorder 03/22/2015  . Paresthesias in antepartum period in third trimester 03/22/2015  . Anxiety during pregnancy, antepartum 01/24/2015  .  PTSD (post-traumatic stress disorder) 09/22/2014  . Major depressive disorder, recurrent, severe with psychotic features ([place]) 09/18/2014  . Hallucinations     Earlie Counts, PT 12/03/19 4:17 PM   [facility] [address], [place], [place], [address] Phone: [phone]   Fax:  [phone]  Name: Alice Castillo MRN: [id number] Date of Birth: 13-Apr-1995

## 2019-12-07 ENCOUNTER — Ambulatory Visit (INDEPENDENT_AMBULATORY_CARE_PROVIDER_SITE_OTHER): Payer: Medicaid Other | Admitting: Student

## 2019-12-07 ENCOUNTER — Other Ambulatory Visit: Payer: Self-pay

## 2019-12-07 ENCOUNTER — Encounter: Payer: Self-pay | Admitting: Student

## 2019-12-07 VITALS — BP 110/74 | HR 87 | Wt 141.9 lb

## 2019-12-07 DIAGNOSIS — M533 Sacrococcygeal disorders, not elsewhere classified: Secondary | ICD-10-CM | POA: Diagnosis not present

## 2019-12-07 DIAGNOSIS — S3992XD Unspecified injury of lower back, subsequent encounter: Secondary | ICD-10-CM

## 2019-12-07 DIAGNOSIS — F32A Depression, unspecified: Secondary | ICD-10-CM

## 2019-12-07 NOTE — Progress Notes (Signed)
  History:  [name] is a 24 y.o. [phone] who presents to clinic today for follow up with tailbone pain. She has been doing PT but the PT only helps on the first day that she does it, then she feels no relief the following days. She feels a bruised sensation and sometimes it "snaps". Patient also reports feeling sad, and food security. Also reports that she has some blood in her stool. Does not want to see Behavioral Health at [facility], has been to [facility] [address] and they prescribed Zoloft. She says that Prozac really made her "crazy" in the past and she is afraid to take Zoloft for fear that it will affect her the way that Prozac did. She wants to know if there is any natural supplements she can take.    Patient also reports that she sometimes has difficulty feeding her family and affording diapers. She is wondering what kind of social supports we can offer her.   The following portions of the patient's history were reviewed and updated as appropriate: allergies, current medications, family history, past medical history, social history, past surgical history and problem list.  Review of Systems:  Review of Systems  Constitutional: Negative.   HENT: Negative.   Respiratory: Negative.   Cardiovascular: Negative.   Genitourinary: Negative.        Bloody in her stool in the morning.   Musculoskeletal: Negative.   Skin: Negative.   Psychiatric/Behavioral: Positive for depression.      Objective:  Physical Exam BP 110/74   Pulse 87   Wt 141 lb 14.4 oz (64.4 kg)   BMI 22.90 kg/m  Physical Exam Constitutional:      Appearance: Normal appearance.  HENT:     Head: Normocephalic.  Musculoskeletal:        General: Normal range of motion.  Skin:    General: Skin is warm.  Neurological:     General: No focal deficit present.     Mental Status: She is alert.       Labs and Imaging No results found for this or any previous visit (from the past 24 hour(s)).  No results  found.   Assessment & Plan:   1. Injury of coccyx, subsequent encounter   2. Depression, unspecified depression type    2. Will refer to [facility] for care and to get started with PCP, may need tailbone x-ray 3. Patient has number for [facility], will seek counseling there. Recommended that patient talk to provider about concerns about Zoloft/Prozac and consider alternatives. Discussed importance of sleep, healthy diet, exercise, avoiding alcohol in order to regulate mood/sleep.  4. Patient spoke with [employer] at [facility] and was able to connect to resources and receive items ;  Approximately 33minutes of total time was spent with this patient on counseling and care.    [place], [place] 12/07/2019 4:27 PM    -Call [facility] for counseling -doesn't want appt with [place] -go to [facility] (faster) -food insecurity and diapers

## 2019-12-10 ENCOUNTER — Other Ambulatory Visit: Payer: Self-pay

## 2019-12-10 ENCOUNTER — Ambulatory Visit: Payer: Medicaid Other | Admitting: Physical Therapy

## 2019-12-10 ENCOUNTER — Encounter: Payer: Self-pay | Admitting: Physical Therapy

## 2019-12-10 DIAGNOSIS — M6281 Muscle weakness (generalized): Secondary | ICD-10-CM | POA: Diagnosis not present

## 2019-12-10 DIAGNOSIS — R252 Cramp and spasm: Secondary | ICD-10-CM

## 2019-12-10 DIAGNOSIS — M533 Sacrococcygeal disorders, not elsewhere classified: Secondary | ICD-10-CM

## 2019-12-10 NOTE — Therapy (Signed)
[facility] [address] [place], [place], [address] Phone: [phone]   Fax:  [phone]  Physical Therapy Treatment  Patient Details  Name: Alice Castillo MRN: [id number] Date of Birth: 15-Sep-1995 Referring Provider (PT): Dr. Darrol Poke   Encounter Date: 12/10/2019   PT End of Session - 12/10/19 1546    Visit Number 9    Date for PT Re-Evaluation 02/18/20    Authorization Type [place] Medicaid UHC    Authorization - Visit Number 9    Authorization - Number of Visits 27    PT Start Time 8546    PT Stop Time 1610    PT Time Calculation (min) 40 min    Activity Tolerance Patient tolerated treatment well;No increased pain    Behavior During Therapy WFL for tasks assessed/performed           Past Medical History:  Diagnosis Date  . Anxiety   . Bipolar 1 disorder ([place])   . Chlamydia infection   . Depression   . 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 Headache   . Supervision of high risk pregnancy, antepartum 04/13/2019    Nursing Staff Provider Office Location [place] Dating  LMP c/w 30wk [place] Language  english Anatomy US  Normal Flu Vaccine  declined Genetic Screen  NIPS:   AFP:   First Screen:  Quad:   TDaP vaccine  04/13/2019 Hgb A1C or  GTT 2hr GTT normal  Rhogam  04/06/2019   LAB RESULTS  Feeding Plan Breast Blood Type O/Negative/-- (02/15 1414)  Contraception Depo Antibody Positive, See Final Results (02/15 1414    Past Surgical History:  Procedure Laterality Date  . NO PAST SURGERIES      There were no vitals filed for this visit.   Subjective Assessment - 12/10/19 1532    Subjective The pain is not as bad.    Patient Stated Goals reduce coccyx pain    Currently in Pain? Yes    Pain Score 5     Pain Location Coccyx    Pain Orientation Mid    Pain Descriptors / Indicators Sharp    Pain Type Acute pain    Pain Onset More than a month ago    Pain Frequency Intermittent    Aggravating Factors  sitting, go  from sitting to standing, twisting when laying on back    Pain Relieving Factors rub the area    Multiple Pain Sites No              [facility] PT Assessment - 12/10/19 0001      Assessment   Medical Diagnosis Z39.2 Postpartum care and examination; R10.2 Pelvic pain    Referring Provider (PT) Dr. Darrol Poke    Onset Date/Surgical Date 07/10/19    Prior Therapy none      Precautions   Precautions None      Restrictions   Weight Bearing Restrictions No      Balance Screen   Has the patient fallen in the past 6 months No    Has the patient had a decrease in activity level because of a fear of falling?  No    Is the patient reluctant to leave their home because of a fear of falling?  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  Cognition   Overall Cognitive Status Within Functional Limits for tasks assessed      Posture/Postural Control   Posture/Postural Control Postural limitations      ROM / Strength   AROM / PROM / Strength AROM;PROM;Strength      AROM   Lumbar Flexion full    Lumbar Extension full    Lumbar - Left Rotation full      PROM   Right Hip External Rotation  65    Left Hip External Rotation  40      Strength   Left Hip Flexion 5/5      Palpation   SI assessment  ASIS are equal      Pelvic Dictraction   Findings Negative    Side  Right    Comment no pain                      Pelvic Floor Special Questions - 12/10/19 0001    Number of Pregnancies 2    Number of Vaginal Deliveries 2   3rd and 2nd degree   Diastasis Recti 1 finger width with good tension    Currently Sexually Active Yes    Is this Painful Yes    Pelvic Floor Internal Exam Patient confirmed identificaiton and approves PT to assess pelvic floor and treatment    Exam Type Vaginal    Palpation tenderness located in the perineal body  on the left, left ischiococcygeus, and ischial spine    Strength fair squeeze, definite lift             [facility] Adult PT Treatment/Exercise - 12/10/19 0001      Manual Therapy   Manual Therapy Internal Pelvic Floor    Internal Pelvic Floor release of the perineal body, along the left levator ani, ischical spine and ichciocccygeus with other hand doing soft tissue work externally; elongation of the right levator ani but not as tight as the left                    PT Short Term Goals - 11/19/19 1238      PT SHORT TERM GOAL #1   Title independent with initial HEP    Time 4    Period Weeks    Status Achieved      PT SHORT TERM GOAL #2   Title coccyx pain with sitting decreased >/= 25%    Time 4    Period Weeks    Status Achieved             PT Long Term Goals - 12/10/19 1617      PT LONG TERM GOAL #1   Title independent with advanced HEP    Baseline still being educated    Time 12    Period Weeks    Status On-going      PT LONG TERM GOAL #2   Title able to sit for 1 hour with pain decreased >/= 75% due to elongation of tissue and increased movement of the coccyx    Baseline reduced by 50%    Time 12    Period Weeks    Status On-going      PT LONG TERM GOAL #3   Title go from sit to stand with pain decreased >/= 75% due to pelvis in correct alignment    Baseline reduced by 50%    Time 12    Period Weeks    Status On-going  PT LONG TERM GOAL #4   Title able to have a bowel movement without coccyx pain    Time 12    Period Weeks    Status Achieved      PT LONG TERM GOAL #5   Title able to have penile penetration with pain level 1/10 due to improved tissue mobility and trigger points in the pelvic floor    Baseline pain level 5/10    Time 10    Period Weeks    Status New    Target Date 02/18/20                 Plan - 12/10/19 1547    Clinical Impression Statement Patient has deep pain during intercourse. She continues to have pain  in the coccyx with sitting and has a popping noise in the coccyx area. Her pelvis is in correct alignment. She still has some weakness in her core making it difficult to keep pelvis stable with movement. She has tenderness located in the perineal body and left ischial spine and left ischiococcygeus. Patient would benefit from skilled therapy to improve core stabilty and reduce pelvic pain.    Personal Factors and Comorbidities Comorbidity 3+;Sex    Comorbidities Bipolar, PTSD; Mild cystocele and rectocele    Examination-Activity Limitations Continence;Sit;Transfers;Toileting    Examination-Participation Restrictions Driving    Stability/Clinical Decision Making Evolving/Moderate complexity    Rehab Potential Excellent    PT Frequency 1x / week    PT Duration 12 weeks    PT Treatment/Interventions ADLs/Self Care Home Management;Biofeedback;Cryotherapy;Electrical Stimulation;Moist Heat;Ultrasound;Neuromuscular re-education;Therapeutic exercise;Therapeutic activities;Patient/family education;Manual techniques;Dry needling;Scar mobilization;Spinal Manipulations    PT Next Visit Plan internal work to the left ishciococcygeus and ischical spine and perineal body; core exercises    PT Home Exercise Plan Access Code: [email or web address]    Recommended Other Services renewal sent    Consulted and Agree with Plan of Care Patient           Patient will benefit from skilled therapeutic intervention in order to improve the following deficits and impairments:  Decreased coordination, Decreased range of motion, Increased fascial restricitons, Pain, Decreased endurance, Decreased mobility, Decreased strength  Visit Diagnosis: Muscle weakness (generalized) - Plan: PT plan of care cert/re-cert  Cramp and spasm - Plan: PT plan of care cert/re-cert  Coccyx pain - Plan: PT plan of care cert/re-cert     Problem List Patient Active Problem List   Diagnosis Date Noted  . Post partum depression 08/27/2019  . Pelvic  floor dysfunction 08/11/2019  . Post-dates pregnancy 07/10/2019  . Asymptomatic COVID-19 virus infection 07/10/2019  . Rubella non-immune status, antepartum 05/15/2019  . 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 Family history of genetic disorder 04/07/2015  . Rh negative state in antepartum period 03/22/2015  . Family history of clotting disorder 03/22/2015  . Paresthesias in antepartum period in third trimester 03/22/2015  . Anxiety during pregnancy, antepartum 01/24/2015  . PTSD (post-traumatic stress disorder) 09/22/2014  . Major depressive disorder, recurrent, severe with psychotic features ([place]) 09/18/2014  . Hallucinations     Earlie Counts, PT 12/10/19 4:27 PM   [facility] [address] [place], [place], [address] Phone: [phone]   Fax:  [phone]  Name: Banita Lehn MRN: [id number] Date of Birth: April 14, 1995

## 2019-12-17 ENCOUNTER — Ambulatory Visit: Payer: Medicaid Other | Admitting: Physical Therapy

## 2019-12-21 ENCOUNTER — Other Ambulatory Visit: Payer: Self-pay

## 2019-12-21 ENCOUNTER — Ambulatory Visit (INDEPENDENT_AMBULATORY_CARE_PROVIDER_SITE_OTHER): Payer: Medicaid Other | Admitting: *Deleted

## 2019-12-21 VITALS — BP 104/76 | HR 77 | Ht 66.0 in | Wt 140.2 lb

## 2019-12-21 DIAGNOSIS — Z3042 Encounter for surveillance of injectable contraceptive: Secondary | ICD-10-CM | POA: Diagnosis not present

## 2019-12-21 MED ORDER — MEDROXYPROGESTERONE ACETATE 150 MG/ML IM SUSP
150.0000 mg | Freq: Once | INTRAMUSCULAR | Status: AC
  Administered 2019-12-21: 150 mg via INTRAMUSCULAR

## 2019-12-21 NOTE — Progress Notes (Signed)
Patient was assessed and managed by nursing staff during this encounter. I have reviewed the chart and agree with the documentation and plan. I have also made any necessary editorial changes.  Verita Schneiders, MD 12/21/2019 4:07 PM

## 2019-12-21 NOTE — Progress Notes (Signed)
[name] here for Depo-Provera  Injection.  Injection administered without complication. Patient will return in 3 months for next injection. Elevated phq9 and gad 7. Offered [facility]; declines for now.  Britany Callicott,RN 12/21/2019  3:08 PM

## 2019-12-24 ENCOUNTER — Other Ambulatory Visit: Payer: Self-pay

## 2019-12-24 ENCOUNTER — Ambulatory Visit: Payer: Medicaid Other | Admitting: Physical Therapy

## 2019-12-24 ENCOUNTER — Encounter: Payer: Self-pay | Admitting: Physical Therapy

## 2019-12-24 DIAGNOSIS — M533 Sacrococcygeal disorders, not elsewhere classified: Secondary | ICD-10-CM

## 2019-12-24 DIAGNOSIS — R252 Cramp and spasm: Secondary | ICD-10-CM

## 2019-12-24 DIAGNOSIS — M6281 Muscle weakness (generalized): Secondary | ICD-10-CM | POA: Diagnosis not present

## 2019-12-24 NOTE — Therapy (Signed)
[facility] [address] [place], [place], [address] Phone: [phone]   Fax:  [phone]  Physical Therapy Treatment  Patient Details  Name: Alice Castillo MRN: [id number] Date of Birth: [birth year] Referring Provider (PT): Dr. Darrol Poke   Encounter Date: 12/24/2019   PT End of Session - 12/24/19 1612    Visit Number 10    Date for PT Re-Evaluation 02/18/20    Authorization Type [place] Medicaid UHC    Authorization - Visit Number 10    Authorization - Number of Visits 27    PT Start Time 5697    PT Stop Time 9480    PT Time Calculation (min) 43 min    Activity Tolerance Patient tolerated treatment well;No increased pain    Behavior During Therapy WFL for tasks assessed/performed           Past Medical History:  Diagnosis Date  . Anxiety   . Bipolar 1 disorder ([place])   . Chlamydia infection   . Depression   . 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 Headache   . Supervision of high risk pregnancy, antepartum 04/13/2019    Nursing Staff Provider Office Location [place] Dating  LMP c/w 30wk [place] Language  english Anatomy US  Normal Flu Vaccine  declined Genetic Screen  NIPS:   AFP:   First Screen:  Quad:   TDaP vaccine  04/13/2019 Hgb A1C or  GTT 2hr GTT normal  Rhogam  04/06/2019   LAB RESULTS  Feeding Plan Breast Blood Type O/Negative/-- (02/15 1414)  Contraception Depo Antibody Positive, See Final Results (02/15 1414    Past Surgical History:  Procedure Laterality Date  . NO PAST SURGERIES      There were no vitals filed for this visit.   Subjective Assessment - 12/24/19 1538    Subjective So far less pain. I have not had popping in the coccyx area.    Patient Stated Goals reduce coccyx pain    Currently in Pain? Yes    Pain Score 3     Pain Location Coccyx    Pain Orientation Mid    Pain Descriptors / Indicators Sharp    Pain Type Acute pain    Pain Onset More than a month ago    Pain Frequency Intermittent     Aggravating Factors  sitting, go from sitting to standing, twisting when laying on back    Pain Relieving Factors reub the area    Multiple Pain Sites Yes    Pain Score 6    Pain Location Vagina   to the lower abdomen   Pain Orientation Mid    Pain Descriptors / Indicators Squeezing;Pressure    Pain Type Acute pain    Pain Onset More than a month ago    Pain Frequency Intermittent    Aggravating Factors  intercourse    Pain Relieving Factors no intercourse                          Pelvic Floor Special Questions - 12/24/19 0001    Pelvic Floor Internal Exam Patient confirmed identificaiton and approves PT to assess pelvic floor and treatment    Exam Type Vaginal             [facility] Adult PT Treatment/Exercise - 12/24/19 0001      Lumbar Exercises: Stretches   Hip Flexor Stretch Right;Left;1 rep;60 seconds    Hip Flexor Stretch Limitations foam roller  Piriformis Stretch Right;Left;1 rep;60 seconds    Piriformis Stretch Limitations foam roll      Lumbar Exercises: Aerobic   Elliptical level 1 6 minutes while assessing patient      Manual Therapy   Manual Therapy Internal Pelvic Floor    Internal Pelvic Floor release of the left urethra sphincter, left coccygeus, iliococcygeus, and ischiococcygeus while monitoring for pain in left sidel                    PT Short Term Goals - 11/19/19 1238      PT SHORT TERM GOAL #1   Title independent with initial HEP    Time 4    Period Weeks    Status Achieved      PT SHORT TERM GOAL #2   Title coccyx pain with sitting decreased >/= 25%    Time 4    Period Weeks    Status Achieved             PT Long Term Goals - 12/10/19 1617      PT LONG TERM GOAL #1   Title independent with advanced HEP    Baseline still being educated    Time 12    Period Weeks    Status On-going      PT LONG TERM GOAL #2   Title able to sit for 1 hour with pain decreased >/= 75% due to elongation of tissue and  increased movement of the coccyx    Baseline reduced by 50%    Time 12    Period Weeks    Status On-going      PT LONG TERM GOAL #3   Title go from sit to stand with pain decreased >/= 75% due to pelvis in correct alignment    Baseline reduced by 50%    Time 12    Period Weeks    Status On-going      PT LONG TERM GOAL #4   Title able to have a bowel movement without coccyx pain    Time 12    Period Weeks    Status Achieved      PT LONG TERM GOAL #5   Title able to have penile penetration with pain level 1/10 due to improved tissue mobility and trigger points in the pelvic floor    Baseline pain level 5/10    Time 10    Period Weeks    Status New    Target Date 02/18/20                 Plan - 12/24/19 1613    Clinical Impression Statement Patient was able to have intercourse but pain level 6/10 on the anterior wall. Patient reports there has been no popping on the coccyx bone. Patient reports decreased in coccyx pain. Patient will benefit from skilled therapy to improve core stability and reduce pelvic pain.    Personal Factors and Comorbidities Comorbidity 3+;Sex    Comorbidities Bipolar, PTSD; Mild cystocele and rectocele    Examination-Activity Limitations Continence;Sit;Transfers;Toileting    Examination-Participation Restrictions Driving    Stability/Clinical Decision Making Evolving/Moderate complexity    Rehab Potential Excellent    PT Frequency 1x / week    PT Duration 12 weeks    PT Treatment/Interventions ADLs/Self Care Home Management;Biofeedback;Cryotherapy;Electrical Stimulation;Moist Heat;Ultrasound;Neuromuscular re-education;Therapeutic exercise;Therapeutic activities;Patient/family education;Manual techniques;Dry needling;Scar mobilization;Spinal Manipulations    PT Next Visit Plan internal work to the left ishciococcygeus and ischical spine and perineal body; core exercises  PT Home Exercise Plan Access Code: [email or web address]    Recommended Other Services MD  signed renewal    Consulted and Agree with Plan of Care Patient           Patient will benefit from skilled therapeutic intervention in order to improve the following deficits and impairments:  Decreased coordination, Decreased range of motion, Increased fascial restricitons, Pain, Decreased endurance, Decreased mobility, Decreased strength  Visit Diagnosis: Muscle weakness (generalized)  Cramp and spasm  Coccyx pain     Problem List Patient Active Problem List   Diagnosis Date Noted  . Post partum depression 08/27/2019  . Pelvic floor dysfunction 08/11/2019  . Post-dates pregnancy 07/10/2019  . Asymptomatic COVID-19 virus infection 07/10/2019  . Rubella non-immune status, antepartum 05/15/2019  . 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 Family history of genetic disorder 04/07/2015  . Rh negative state in antepartum period 03/22/2015  . Family history of clotting disorder 03/22/2015  . Paresthesias in antepartum period in third trimester 03/22/2015  . Anxiety during pregnancy, antepartum 01/24/2015  . PTSD (post-traumatic stress disorder) 09/22/2014  . Major depressive disorder, recurrent, severe with psychotic features ([place]) 09/18/2014  . Hallucinations     Earlie Counts, PT 12/24/19 4:16 PM   [facility] [address], [place], [place], [address] Phone: [phone]   Fax:  [phone]  Name: Alice Castillo MRN: [id number] Date of Birth: 12/16/1995

## 2020-01-28 ENCOUNTER — Ambulatory Visit: Payer: Medicaid Other | Admitting: Physical Therapy

## 2020-02-11 ENCOUNTER — Other Ambulatory Visit: Payer: Self-pay

## 2020-02-11 ENCOUNTER — Encounter: Payer: Self-pay | Admitting: Physical Therapy

## 2020-02-11 ENCOUNTER — Ambulatory Visit: Payer: Medicaid Other | Attending: Certified Nurse Midwife | Admitting: Physical Therapy

## 2020-02-11 DIAGNOSIS — M6281 Muscle weakness (generalized): Secondary | ICD-10-CM | POA: Insufficient documentation

## 2020-02-11 DIAGNOSIS — R252 Cramp and spasm: Secondary | ICD-10-CM | POA: Insufficient documentation

## 2020-02-11 DIAGNOSIS — M533 Sacrococcygeal disorders, not elsewhere classified: Secondary | ICD-10-CM | POA: Insufficient documentation

## 2020-02-11 NOTE — Therapy (Signed)
[facility] [address], [place], [place], [address] Phone: [phone]   Fax:  [phone]  Physical Therapy Treatment  Patient Details  Name: Alice Castillo MRN: [id number] Date of Birth: [birth year] Referring Provider (PT): Dr. Darrol Poke   Encounter Date: 02/11/2020   PT End of Session - 02/11/20 1403    Visit Number 11    Date for PT Re-Evaluation 02/18/20    Authorization Type [place] Medicaid [place] - Visit Number 11    Authorization - Number of Visits 27    PT Start Time 9476    PT Stop Time 5465    PT Time Calculation (min) 39 min    Activity Tolerance Patient tolerated treatment well;No increased pain    Behavior During Therapy WFL for tasks assessed/performed           Past Medical History:  Diagnosis Date  . Anxiety   . Bipolar 1 disorder ([place])   . Chlamydia infection   . Depression   . 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 Headache   . Supervision of high risk pregnancy, antepartum 04/13/2019    Nursing Staff Provider Office Location [place] Dating  LMP c/w 30wk [place] Language  english Anatomy US  Normal Flu Vaccine  declined Genetic Screen  NIPS:   AFP:   First Screen:  Quad:   TDaP vaccine  04/13/2019 Hgb A1C or  GTT 2hr GTT normal  Rhogam  04/06/2019   LAB RESULTS  Feeding Plan Breast Blood Type O/Negative/-- (02/15 1414)  Contraception Depo Antibody Positive, See Final Results (02/15 1414    Past Surgical History:  Procedure Laterality Date  . NO PAST SURGERIES      There were no vitals filed for this visit.   Subjective Assessment - 02/11/20 1405    Subjective I did not come in 2 weeks ago due to being sick. I just feel soreness and no snapping noise. intercourse hurts like he is hitting a wall when he goes in the rear.    Patient Stated Goals reduce coccyx pain    Currently in Pain? Yes    Pain Score 2     Pain Location Coccyx    Pain Orientation Mid    Pain Descriptors /  Indicators Sore    Pain Type Acute pain    Pain Onset More than a month ago    Pain Frequency Intermittent    Aggravating Factors  pain with sitting    Pain Relieving Factors laying down    Multiple Pain Sites Yes    Pain Score 7    Pain Location Vagina    Pain Orientation Mid    Pain Descriptors / Indicators Stabbing    Pain Type Acute pain    Pain Onset More than a month ago    Pain Frequency Intermittent    Aggravating Factors  intercourse from behind    Pain Relieving Factors no intercourse              [facility] PT Assessment - 02/11/20 0001      Assessment   Medical Diagnosis Z39.2 Postpartum care and examination; R10.2 Pelvic pain    Referring Provider (PT) Dr. 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      Cognition   Overall Cognitive Status Within Functional Limits for tasks assessed      Posture/Postural Control   Posture/Postural Control No significant limitations      PROM   Right Hip External Rotation  65    Left Hip External Rotation  40      Strength   Left Hip Flexion 5/5      Palpation   SI assessment  ASIS are equal                      Pelvic Floor Special Questions - 02/11/20 0001    Pelvic Floor Internal Exam Patient confirmed identificaiton and approves PT to assess pelvic floor and treatment    Exam Type Vaginal    Palpation tenderness located in coccygeus, ligament to the coccyx, levator ani, tightness along the ant. cervix    Strength fair squeeze, definite lift             [facility] Adult PT Treatment/Exercise - 02/11/20 0001      Manual Therapy   Manual Therapy Internal Pelvic Floor    Internal Pelvic Floor release of the  anterior cervic from the wall with using fascial release techniques, manual work to the coccygeus, pubococcyxgeus                    PT Short Term Goals - 11/19/19 1238      PT SHORT TERM GOAL #1   Title independent with initial HEP    Time 4    Period Weeks    Status Achieved      PT SHORT TERM GOAL #2   Title coccyx pain with sitting decreased >/= 25%    Time 4    Period Weeks    Status Achieved             PT Long Term Goals - 02/11/20 1412      PT LONG TERM GOAL #1   Title independent with advanced HEP    Baseline still being educated    Time 12    Period Weeks    Status On-going      PT LONG TERM GOAL #2   Title able to sit for 1 hour with pain decreased >/= 75% due to elongation of tissue and increased movement of the coccyx    Baseline pain level is 1/20    Time 12    Status On-going      PT LONG TERM GOAL #3   Title go from sit to stand with pain decreased >/= 75% due to pelvis in correct alignment    Baseline pain level 2/10    Time 12    Period Weeks    Status On-going      PT LONG TERM GOAL #4   Title able to have a bowel movement without coccyx pain    Time 12    Period Weeks    Status Achieved      PT LONG TERM GOAL #5   Title able to have penile penetration with pain level 1/10 due to improved tissue mobility and trigger points in the pelvic floor    Baseline pain level 5/10    Time 10    Period Weeks    Status On-going  Plan - 02/11/20 1404    Clinical Impression Statement Patient pain level for intercourse is 7/10 anteriorly like the penis is hitting a wall. Patient continues to have coccyx pain at level 2/10 with sitting. Patient has decreased movement of the anterior portion of the cervix from the anterior vaginal wall. She has decreased mobility of the coccyx that is being pulled anteriorly. Patient continues to have pain at level 2/10 in cocccyx area with sitting. Patient is not having the popping of the  coccyx. Pelvic strength is 3/5 with a good lift. Patient has trigger points in the coccygeus, puborectalis, ishiococcygeus. Patient will benefit from skilled therapy to reduce her pain with daily function.    Personal Factors and Comorbidities Comorbidity 3+;Sex    Comorbidities Bipolar, PTSD; Mild cystocele and rectocele    Examination-Activity Limitations Continence;Sit;Transfers;Toileting    Examination-Participation Restrictions Driving    Stability/Clinical Decision Making Evolving/Moderate complexity    Rehab Potential Excellent    PT Frequency 1x / week    PT Duration 12 weeks    PT Treatment/Interventions ADLs/Self Care Home Management;Biofeedback;Cryotherapy;Electrical Stimulation;Moist Heat;Ultrasound;Neuromuscular re-education;Therapeutic exercise;Therapeutic activities;Patient/family education;Manual techniques;Dry needling;Scar mobilization;Spinal Manipulations    PT Next Visit Plan internal work to the left ishciococcygeus and ischical spine; core exercises; quadruped release of the coccyx and sitting on therapist finger to release the muscles    PT Home Exercise Plan Access Code: [email or web address]    Consulted and Agree with Plan of Care Patient           Patient will benefit from skilled therapeutic intervention in order to improve the following deficits and impairments:  Decreased coordination,Decreased range of motion,Increased fascial restricitons,Pain,Decreased endurance,Decreased mobility,Decreased strength  Visit Diagnosis: Muscle weakness (generalized) - Plan: PT plan of care cert/re-cert  Cramp and spasm - Plan: PT plan of care cert/re-cert  Coccyx pain - Plan: PT plan of care cert/re-cert     Problem List Patient Active Problem List   Diagnosis Date Noted  . Post partum depression 08/27/2019  . Pelvic floor dysfunction 08/11/2019  . Post-dates pregnancy 07/10/2019  . Asymptomatic COVID-19 virus infection 07/10/2019  . Rubella non-immune status, antepartum  05/15/2019  . 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 Family history of genetic disorder 04/07/2015  . Rh negative state in antepartum period 03/22/2015  . Family history of clotting disorder 03/22/2015  . Paresthesias in antepartum period in third trimester 03/22/2015  . Anxiety during pregnancy, antepartum 01/24/2015  . PTSD (post-traumatic stress disorder) 09/22/2014  . Major depressive disorder, recurrent, severe with psychotic features ([place]) 09/18/2014  . Hallucinations     Earlie Counts, PT 02/11/20 2:55 PM   [facility] [address] [place], [place], [address] Phone: [phone]   Fax:  [phone]  Name: Alice Castillo MRN: [id number] Date of Birth: [birth year]

## 2020-03-07 ENCOUNTER — Encounter: Payer: Self-pay | Admitting: *Deleted

## 2020-03-07 ENCOUNTER — Other Ambulatory Visit: Payer: Self-pay

## 2020-03-07 ENCOUNTER — Encounter: Payer: Self-pay | Admitting: Physical Therapy

## 2020-03-07 ENCOUNTER — Ambulatory Visit (INDEPENDENT_AMBULATORY_CARE_PROVIDER_SITE_OTHER): Payer: Medicaid Other | Admitting: *Deleted

## 2020-03-07 ENCOUNTER — Ambulatory Visit: Payer: Medicaid Other | Attending: Certified Nurse Midwife | Admitting: Physical Therapy

## 2020-03-07 VITALS — BP 106/80 | HR 75 | Wt 133.0 lb

## 2020-03-07 DIAGNOSIS — R252 Cramp and spasm: Secondary | ICD-10-CM | POA: Insufficient documentation

## 2020-03-07 DIAGNOSIS — Z3042 Encounter for surveillance of injectable contraceptive: Secondary | ICD-10-CM | POA: Diagnosis not present

## 2020-03-07 DIAGNOSIS — M533 Sacrococcygeal disorders, not elsewhere classified: Secondary | ICD-10-CM | POA: Diagnosis present

## 2020-03-07 DIAGNOSIS — M6281 Muscle weakness (generalized): Secondary | ICD-10-CM | POA: Diagnosis present

## 2020-03-07 MED ORDER — MEDROXYPROGESTERONE ACETATE 150 MG/ML IM SUSP
150.0000 mg | Freq: Once | INTRAMUSCULAR | Status: AC
  Administered 2020-03-07: 150 mg via INTRAMUSCULAR

## 2020-03-07 NOTE — Progress Notes (Signed)
PT here for Depo injection which was given without difficulty. Pt given ice bag to hold for injection per her request. PT states that keeps her from "passing out" and she did well. Noted PHQ9 and GAD-7 scores high. Offered [facility] but she states she has met with [name] and pt currently is unable to do suggested therapies.Suggestions for her included to go out for a walk but states she cannot do that due to weather being too cold for 75yrand 7376mold. Does not have help during day as her boyfriend works night shift and sleeps during the day. [facility] [employer] [facility]. Knows to return in 76m39monthor next Depo and will schedule when leaving today.

## 2020-03-07 NOTE — Therapy (Signed)
[facility] [address] [place], [place], [address] Phone: [phone]   Fax:  [phone]  Physical Therapy Treatment renewal/Discharge  Patient Details  Name: Alice Castillo MRN: [id number] Date of Birth: 1996-01-23 Referring Provider (PT): Dr. Darrol Poke   Encounter Date: 03/07/2020   PT End of Session - 03/07/20 1656    Visit Number 12    Date for PT Re-Evaluation 02/18/20    Authorization Type [place] Medicaid [employer]    PT Start Time 5732    PT Stop Time 1645    PT Time Calculation (min) 30 min    Activity Tolerance Patient tolerated treatment well;No increased pain    Behavior During Therapy WFL for tasks assessed/performed           Past Medical History:  Diagnosis Date  . Anxiety   . Bipolar 1 disorder ([place])   . Chlamydia infection   . Depression   . 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 Headache   . Supervision of high risk pregnancy, antepartum 04/13/2019    Nursing Staff Provider Office Location [place] Dating  LMP c/w 30wk [place] Language  english Anatomy US  Normal Flu Vaccine  declined Genetic Screen  NIPS:   AFP:   First Screen:  Quad:   TDaP vaccine  04/13/2019 Hgb A1C or  GTT 2hr GTT normal  Rhogam  04/06/2019   LAB RESULTS  Feeding Plan Breast Blood Type O/Negative/-- (02/15 1414)  Contraception Depo Antibody Positive, See Final Results (02/15 1414    Past Surgical History:  Procedure Laterality Date  . NO PAST SURGERIES      There were no vitals filed for this visit.   Subjective Assessment - 03/07/20 1617    Subjective I just have slight pressure in the coccyx area. Have not had intercourse for awhile.    Patient Stated Goals reduce coccyx pain    Currently in Pain? No/denies              [facility] PT Assessment - 03/07/20 0001      Assessment   Medical Diagnosis Z39.2 Postpartum care and examination; R10.2 Pelvic pain    Referring Provider (PT) Dr. 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      Cognition   Overall Cognitive Status Within Functional Limits for tasks assessed      AROM   Lumbar Flexion full    Lumbar Extension full    Lumbar - Left Rotation full      PROM   Right Hip External Rotation  65    Left Hip External Rotation  55      Strength   Left Hip Flexion 5/5      Palpation   SI assessment  ASIS are equal      Pelvic Dictraction   Findings Negative    Side  Right    Comment no pain                      Pelvic Floor Special Questions - 03/07/20 0001    Urinary Leakage Yes  Activities that cause leaking Sneezing    Urinary urgency No    Pelvic Floor Internal Exam Patient confirmed identificaiton and approves PT to assess pelvic floor and treatment    Exam Type Vaginal    Palpation tenderness located on the left ATLA    Strength fair squeeze, definite lift             [facility] Adult PT Treatment/Exercise - 03/07/20 0001      Neuro Re-ed    Neuro Re-ed Details  pelvic floor contraction with tactile cues to improve the circular contraction with a lift      Exercises   Exercises Other Exercises    Other Exercises  verbally reviewed her HEP and she feels confident with them      Manual Therapy   Manual Therapy Internal Pelvic Floor    Internal Pelvic Floor release around the bladder, manual work to the ATLA, assessment of the coccyx motion, manual work to the left iliococccyygeus                    PT Short Term Goals - 11/19/19 1238      PT SHORT TERM GOAL #1   Title independent with initial HEP    Time 4    Period Weeks    Status Achieved      PT SHORT TERM GOAL #2   Title coccyx pain with sitting decreased >/= 25%     Time 4    Period Weeks    Status Achieved             PT Long Term Goals - 03/07/20 1621      PT LONG TERM GOAL #1   Title independent with advanced HEP    Baseline still being educated    Time 12    Period Weeks    Status Achieved      PT LONG TERM GOAL #2   Title able to sit for 1 hour with pain decreased >/= 75% due to elongation of tissue and increased movement of the coccyx    Time 12    Period Weeks    Status Achieved      PT LONG TERM GOAL #3   Title go from sit to stand with pain decreased >/= 75% due to pelvis in correct alignment    Time 12    Period Weeks    Status Achieved      PT LONG TERM GOAL #4   Title able to have a bowel movement without coccyx pain    Time 12    Period Weeks    Status Achieved      PT LONG TERM GOAL #5   Title able to have penile penetration with pain level 1/10 due to improved tissue mobility and trigger points in the pelvic floor    Time 10    Period Weeks    Status Partially Met                 Plan - 03/07/20 1656    Clinical Impression Statement Patient was not able to come in the past 3 weeks due to therapist schedule and patients schedule. Patient has been doing her exercise program. Patient is not having the coccyx pain. She has good mobiity of the coccyx bone. Pelvic floor strength is 3/5 with a lift. Patient did not have any trigger points just soreness in the left ATLA and ilioccyygeus. Patient is able to sit without difficulty and have a  bowel movement without pain. Patient able to go from sit to stand without pain. Patient has not had penile penetration lately so she does not know if there is pain. Patient has met her goals. She is ready for discharge.    Personal Factors and Comorbidities Comorbidity 3+;Sex    Comorbidities Bipolar, PTSD; Mild cystocele and rectocele    Examination-Activity Limitations Continence;Sit;Transfers;Toileting    Examination-Participation Restrictions Driving    Stability/Clinical  Decision Making Evolving/Moderate complexity    Rehab Potential Excellent    PT Frequency One time visit    PT Duration --   for the final visit for discharge   PT Treatment/Interventions ADLs/Self Care Home Management;Biofeedback;Cryotherapy;Electrical Stimulation;Moist Heat;Ultrasound;Neuromuscular re-education;Therapeutic exercise;Therapeutic activities;Patient/family education;Manual techniques;Dry needling;Scar mobilization;Spinal Manipulations    PT Next Visit Plan Discharge to HEP    PT Home Exercise Plan Access Code: [email or web address]    Consulted and Agree with Plan of Care Patient           Patient will benefit from skilled therapeutic intervention in order to improve the following deficits and impairments:  Decreased coordination,Decreased range of motion,Increased fascial restricitons,Pain,Decreased endurance,Decreased mobility,Decreased strength  Visit Diagnosis: Muscle weakness (generalized) - Plan: PT plan of care cert/re-cert  Cramp and spasm - Plan: PT plan of care cert/re-cert  Coccyx pain - Plan: PT plan of care cert/re-cert     Problem List Patient Active Problem List   Diagnosis Date Noted  . Post partum depression 08/27/2019  . Pelvic floor dysfunction 08/11/2019  . Post-dates pregnancy 07/10/2019  . Asymptomatic COVID-19 virus infection 07/10/2019  . Rubella non-immune status, antepartum 05/15/2019  . 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 Family history of genetic disorder 04/07/2015  . Rh negative state in antepartum period 03/22/2015  . Family history of clotting disorder 03/22/2015  . Paresthesias in antepartum period in third trimester 03/22/2015  . Anxiety during pregnancy, antepartum 01/24/2015  . PTSD (post-traumatic stress disorder) 09/22/2014  . Major depressive disorder, recurrent, severe with psychotic features ([place]) 09/18/2014  . Hallucinations     Earlie Counts, PT 03/07/20 5:04 PM   [facility] [address], [place], [place], [address] Phone: [phone]   Fax:  [phone]  Name: Alice Castillo MRN: [id number] Date of Birth: [birth year]  PHYSICAL THERAPY DISCHARGE SUMMARY  Visits from Start of Care: 12  Current functional level related to goals / functional outcomes: See above.   Remaining deficits: See above.   Education / Equipment: HEP Plan: Patient agrees to discharge.  Patient goals were met. Patient is being discharged due to meeting the stated rehab goals. Thank you for the referral. Earlie Counts, PT 03/07/20 5:04 PM   ?????

## 2020-03-21 ENCOUNTER — Encounter: Payer: Medicaid Other | Admitting: Physical Therapy

## 2020-03-22 NOTE — Progress Notes (Signed)
Patient was assessed and managed by nursing staff during this encounter. I have reviewed the chart and agree with the documentation and plan. I have also made any necessary editorial changes.  Emeterio Reeve, MD 03/22/2020 9:20 AM  Patient ID: Alice Castillo, female   DOB: [birth year], 25 y.o.   MRN: [id number]

## 2020-04-04 ENCOUNTER — Encounter: Payer: Medicaid Other | Admitting: Physical Therapy

## 2020-05-30 ENCOUNTER — Ambulatory Visit (INDEPENDENT_AMBULATORY_CARE_PROVIDER_SITE_OTHER): Payer: Medicaid Other | Admitting: *Deleted

## 2020-05-30 ENCOUNTER — Other Ambulatory Visit: Payer: Self-pay

## 2020-05-30 ENCOUNTER — Other Ambulatory Visit (HOSPITAL_COMMUNITY)
Admission: RE | Admit: 2020-05-30 | Discharge: 2020-05-30 | Disposition: A | Discharge status: Home / Self Care | Payer: Medicaid Other | Source: Ambulatory Visit | Attending: Family Medicine | Admitting: Family Medicine

## 2020-05-30 VITALS — BP 104/77 | HR 73 | Ht 66.0 in | Wt 132.1 lb

## 2020-05-30 DIAGNOSIS — Z202 Contact with and (suspected) exposure to infections with a predominantly sexual mode of transmission: Secondary | ICD-10-CM | POA: Diagnosis present

## 2020-05-30 DIAGNOSIS — Z3042 Encounter for surveillance of injectable contraceptive: Secondary | ICD-10-CM

## 2020-05-30 DIAGNOSIS — N898 Other specified noninflammatory disorders of vagina: Secondary | ICD-10-CM | POA: Diagnosis not present

## 2020-05-30 MED ORDER — MEDROXYPROGESTERONE ACETATE 150 MG/ML IM SUSP
150.0000 mg | Freq: Once | INTRAMUSCULAR | Status: AC
Start: 2020-05-30 — End: 2020-05-30
  Administered 2020-05-30: 150 mg via INTRAMUSCULAR

## 2020-05-30 NOTE — Progress Notes (Signed)
Here for nurse visit for depoprovera. Last injection 03/07/20. Last annual was postpartum visit 08/11/19. Discussed needs annual exam 06/22. Can do with next depo-provera. Injection given without complaint. Next due 08/15/20-08/29/20 Also asked to do wet prep due to vaginal itching- would like to check for std, bv, yeast. Joli Koob,RN

## 2020-05-31 LAB — CERVICOVAGINAL ANCILLARY ONLY
Bacterial Vaginitis (gardnerella): NEGATIVE
Candida Glabrata: NEGATIVE
Candida Vaginitis: POSITIVE — AB
Chlamydia: NEGATIVE
Comment: NEGATIVE
Comment: NEGATIVE
Comment: NEGATIVE
Comment: NEGATIVE
Comment: NEGATIVE
Comment: NORMAL
Neisseria Gonorrhea: NEGATIVE
Trichomonas: NEGATIVE

## 2020-06-06 ENCOUNTER — Other Ambulatory Visit: Payer: Self-pay | Admitting: Obstetrics & Gynecology

## 2020-06-06 DIAGNOSIS — N898 Other specified noninflammatory disorders of vagina: Secondary | ICD-10-CM

## 2020-06-06 MED ORDER — FLUCONAZOLE 150 MG PO TABS: 150.0000 mg | ORAL_TABLET | Freq: Once | ORAL | 0 refills | Status: AC

## 2020-06-06 NOTE — Progress Notes (Signed)
Meds ordered this encounter  Medications   fluconazole (DIFLUCAN) 150 MG tablet    Sig: Take 1 tablet (150 mg total) by mouth once for 1 dose.    Dispense:  1 tablet    Refill:  0    

## 2020-08-16 ENCOUNTER — Other Ambulatory Visit: Payer: Self-pay

## 2020-08-16 ENCOUNTER — Ambulatory Visit (INDEPENDENT_AMBULATORY_CARE_PROVIDER_SITE_OTHER): Payer: Medicaid Other

## 2020-08-16 VITALS — BP 105/74 | HR 93 | Ht 66.0 in | Wt 134.0 lb

## 2020-08-16 DIAGNOSIS — Z3042 Encounter for surveillance of injectable contraceptive: Secondary | ICD-10-CM

## 2020-08-16 DIAGNOSIS — R519 Headache, unspecified: Secondary | ICD-10-CM | POA: Diagnosis not present

## 2020-08-16 DIAGNOSIS — Z0000 Encounter for general adult medical examination without abnormal findings: Secondary | ICD-10-CM

## 2020-08-16 MED ORDER — MEDROXYPROGESTERONE ACETATE 150 MG/ML IM SUSP
150.0000 mg | Freq: Once | INTRAMUSCULAR | Status: AC
  Administered 2020-08-16: 150 mg via INTRAMUSCULAR

## 2020-08-16 NOTE — Progress Notes (Signed)
Subjective:     Alice Castillo is a 25 y.o. female here at [facility] for a routine exam and depo injection.  Current complaints: no GYN complaints. Reports almost daily headaches since about 3-4 months after she delivered her baby. She reports that it often hurts behind her eyes and sometimes feels as if her vision gets blurry. Bright lights make symptoms worse. She denies loss of balance/strength, nausea/vomiting, or other toxic symptoms. She reports that she has had maybe 1-2 migraines in her lifetime, but none were as bad as these. She takes Advil and Excedrin which will take the edge off, but nothing completely takes away the headache. She also endorses a lot of stress at home as she and her family are trying to find a new place to live. She has not had a recent eye exam. Personal health questionnaire reviewed: yes.  Do you have a primary care provider? no Do you feel safe at home? yes  Flowsheet Row Clinical Support from 05/30/2020 in [facility] [employer] [facility] [employer] [facility]  PHQ-2 Total Score 3       Health Maintenance Due  Topic Date Due   COVID-19 Vaccine (1) Never done   Hepatitis C Screening  Never done     Risk factors for chronic health problems: Smoking: none Alchohol/how much: occasional Illicit drugs: none Pt BMI: Body mass index is 21.63 kg/m.   Gynecologic History No LMP recorded. Patient has had an injection. Contraception: Depo-Provera injections Last Pap: 08/11/2019 Results were: normal Last mammogram: n/a to age. Sexual health: no issues  Obstetric History OB History  Gravida Para Term Preterm AB Living  4 2 2   2 2   SAB IAB Ectopic Multiple Live Births    2   0 2    # Outcome Date GA Lbr Len/2nd Weight Sex Delivery Anes PTL Lv  4 Term 07/10/19 [redacted]w[redacted]d 03:04 / 00:05 8 lb 13.5 oz (4.01 kg) M Vag-Spont Local  LIV  3 IAB 2018          2 IAB 2018          1 Term 06/07/15 107w3d 15:33 / 00:31 8 lb 9.6 oz (3.901 kg) M Vag-Spont  None  LIV    The following portions of the patient's history were reviewed and updated as appropriate: allergies, current medications, past family history, past medical history, past social history, past surgical history, and problem list.  Review of Systems Pertinent items are noted in HPI.    Objective:   BP 105/74   Pulse 93   Ht 5\' 6"  (1.676 m)   Wt 134 lb (60.8 kg)   BMI 21.63 kg/m  VS reviewed, nursing note reviewed,  Constitutional: well developed, well nourished, no distress HEENT: normocephalic CV: normal rate and rhythm Pulm/chest wall: normal effort, breath sounds clear Breast Exam: deferred with low risks and shared decision making, discussed recommendation to start mammogram between 40-50 yo/ exam Abdomen: soft Neuro: alert and oriented x 3, no focal deficits Skin: warm, dry Psych: affect normal Pelvic exam: deferred Bimanual exam: deferred     Assessment/Plan:   1. Nonintractable headache, unspecified chronicity pattern, unspecified headache type - Given headache and symptoms associated with headache, will refer to neurology for further evaluation and work up  - Ambulatory referral to Neurology  2. Well woman exam without gynecological exam - Routine well woman exam - Depo given - Encouraged patient to find PCP as well as have eye exam, list of  resources provided - Offered patient appointment with [name], however patient declines as she says that she feels as if it doesn't help - Offered food clinic as well. Patient declines   Follow up in: 3 months for Depo or as needed. Return in 1 year for well woman exam.   Renee Harder, CNM 08/16/20 10:49 AM

## 2020-08-16 NOTE — Progress Notes (Addendum)
Patient is here for Depo contraception injection. Last injection was 05-30-20 and is due for another one today. Patient did complain of severe migraines and would like to be prescribed something

## 2020-08-16 NOTE — Progress Notes (Signed)
Depo 150 mg contraception injection administered into right deltoid without any complications. Next dose due between September 6- November 15, 2020  Zella Richer, [place]  08/16/20

## 2020-08-16 NOTE — Patient Instructions (Addendum)
[place] Resource Guide (Revised August 2014)    Insufficient Money for Medicine:           United Way: call "211"   MAP Program at [facility] [place] 641-8030 or [place] [phone]            No Primary Care Doctor:  To locate a primary care doctor that accepts your insurance or provides certain services:           [employer] [facility]: [phone]           Physician Referral Service: 1-800-533-3463 ask for "My [place]" If no insurance, you need to see if you qualify for GCCN "orange card", call to set      up appointment for eligibility/enrollment at [phone] or [phone] or visit [place] [facility] ([address], [place] and [address] -[place]) to meet with a GCCN enrollment specialist.  Agencies that provide inexpensive (sliding fee scale) medical care:      [facility] - [facility] at [place] - [phone]    [facility]  -  [facility] - [phone]    [facility] - [phone]    [facility] - [phone]    [facility] - [phone]    [facility] - [phone] [facility] - [facility] [place] - [phone]     [phone] [facility] - [facility] [place] - [phone] [facility]: [phone]  [facility] Clinic: [phone]  Planned Parenthood: [phone]  [facility] - [phone]- [phone]    Medicaid-accepting [place] Providers:           [facility] - [phone] (No Family Planning accepted)          [address], [phone], Mon-Fri 9am-5pm          [facility] - [phone], [address], Mon-Thursday 8am-5pm, Fri 8am-noon [facility] - [phone]          [address],  [address], Mon-Fri 7:30am-4:30pm          [facility] - [phone]          [address] [place] [address], Mon-Fri 8am-5pm          [facility] - [phone]          [address]          Only accepts [place] Access Medicaid patients after they have their name applied to their card  Self Pay (no insurance) in [place]:           Sickle Cell Patients:  [address], [phone] [facility]: [address], [place] [phone]       [facility] [address],  [phone]  [facility]: [address], [phone]          [facility]           [address], [phone] [facility] [address], [phone]           [facility]             [address], [place] [phone]        [facility] - [address]           [phone], Mon-Fri 9am-7pm, Sat 9am-1pm          [facility] - [facility] @ [place]          [address] [place] [address], [phone]          [facility] [place]           [address], [phone] [facility] - [facility] [place] [address], [place] [phone]          [facility]           [address] [place] [address], [phone]  [facility]  [address], [phone] [facility] [address], [phone]  Dr. Osei-Bonsu           [address], [place], [phone]          [facility]           [address], [phone]          [facility]             [address], [phone]          [facility]           [address], [phone], 1st & 3rd Saturday every month, 10am-1pm OTHERS:  [facility]  (Imm[facility] Access Clinic Only)  [phone] (Thursday only) Strategies for finding a Primary Care Provider:  1) Find a Doctor and Pay Out of Pocket  Although you won't have to find out who is covered by your insurance plan, it is a good idea to ask around and get recommendations. You will then need to call the office and see if the doctor you have chosen will accept you as a new patient and what types of options they offer for patients who are self-pay. Some doctors offer discounts or will set up payment plans for their patients who do not have insurance, but you will need to ask so you aren't surprised when you get to your appointment.  2) Contact [facility] - To see if you qualify for "orange card" access to healthcare safety net providers.  Call for appointment for eligibility/enrollment at [phone] or [phone]. (Uninsured, 0-200% FPL, qualifying info)  Applicants for GCCN are first required to see if they are eligible to enroll in the ACA Marketplace before enrolling in GCCN (and get an exemption if they are not).  GCCN Criteria for acceptance is:  Proof of ACA Marketing exemption - form or documentation  Valid photo ID (driver's license, state identification card, passport, home country ID)  Proof of [place] residency (e.g. driver's license, lease/landlord information, pay stubs with address, utility bill, bank statement, etc.)  Proof of income (1040, last year's tax return, W2, 4 current pay stubs, other income proof)  Proof of assets (current bank statement + 3 most recent, disability paperwork, life insurance info, tax value on autos, etc.)  3) Contact Your Local Health Department  Not all health departments have doctors that can see patients for sick visits,   but many do, so it is worth a call to see if yours does. If you don't know where your local health  department is, you can check in your phone book. The CDC also has a tool to help you locate your state's health department, and many state websites also have listings of all of their local health departments.  4) Find a Walk-in Clinic  If your illness is not likely to be very severe or complicated, you may want to try a walk in clinic. These are popping up all over the country in pharmacies, drugstores, and shopping centers. They're usually staffed by nurse practitioners or physician assistants that have been trained to treat common illnesses and complaints. They're usually fairly quick and inexpensive. However, if you have serious medical issues or chronic medical problems, these are probably not your best option   STD Testing:           [place] [facility] [place], STD Clinic           [address], [place], phone [phone] or [phone]           Monday - Friday, call for an appointment          [place] [facility] [place], STD Clinic           [address], [place], phone [phone] or [phone]           Monday - Friday, call for an appointment Abuse/Neglect:           [place] Child Abuse Hotline: 641-3795           [place] Child Abuse Hotline: 800-378-5315 (After Hours) Emergency Shelter:  [facility] (336) 271-5959  [facility] [place]- (336) 881-5415  [facility] [place] - (336) 235-0330  [facility] - (336) 375-1332 (ages 11-17)  Homeless Day Shelter @ [facility] - (336) 332-0824   Mammograms - Free at [facility] - [place] - 614-3233  Maternity Homes:           [facility]: (336) [phone]   (Homeless mother with children)          [facility]: (704) 372-4663 (Mothers only) [facility]: (336) 370-9232 (Pregnant 16-21 years old) Adopt a Mom -(336) 641-7777  [place] Resources  [facility] - [facility] [address], [place] (336) [phone]          [facility] [place]           [address], [place]           [phone]          United Way           [address], [place]           [phone]          [place] [facility]           [address], [place]           [phone]          [place] [facility]           [phone]          [place] [facility] - [facility]           1-888-581-9988            [facility] in [place]           [address]           [phone], Insurance          [place] Child Abuse Hotline           (336) 342-1394           (336) 342-3537 (After Hours)  Behavioral Health Services /Substance Abuse Resources:           [facility] Drug Services: 882-2125           [facility]: 784-9470          [facility]: (336) 285-9073  Narcotics Helpline - 1-866-375-1272          [facility]: (336) 845-3988           Residential & Outpatient Substance Abuse Program - [facility]: 800-659-3381 [employer]  [facility] - (336) 285-2605 Psychological Services:          [facility]: [phone]  [facility]: 1-877-626-1772          [facility]           [address] [place] [address], [place]           ACCESS LINE: 1-800-256-2452     (24 Hour) Mobile Crisis:  [place]:  National [facility] on Mental Illness - [place] (336) 370-4264 [facility] - [place] (800) 451-9682 Walk In Crisis Services       [facility] - [address] [place] [address] - [place]  (336)676-6905       [facility] - (336)832-9600 or (336) 832-9700  [facility] - [address] - [place] (336)899-1505  [facility] - [address], [place] (336) [phone]   Dental Assistance:   If unable to pay or uninsured, contact: [place] [facility] to become qualified for the adult dental clinic. Patient must be enrolled in GCCN (uninsured, 0-200% FPL, qualifying info).  Enroll in GCCN first, then see Primary Care Physician assigned to you, the PCP makes a dental referral. [facility] will receive referral and contacts patient for appointment.  Patients with Medicaid           [address], [phone]  [facility] (Children up to 20 + Pregnant Women) - (336) [phone]  [facility] - [address] - [address] (336) [phone]  If unable to pay, or uninsured: contact [place] [facility] ([phone] in  - (Children only + Pregnant Women), [phone] in [place]- Children only) to become qualified for the adult dental clinic  Must see if eligible to enroll in ACA Health Insurance Marketplace before enrolling into the GCCN (exemption required) (1-855-733-3711 for an appointment)  www.healthcare.gov;   1-800-318-2596.  If not eligible for ACA, then go by Department of Health and Human Services to see if eligible for "orange card."  [address], [place] and [address]- [place].  Once you get an orange card, you will have a Primary Care home who will then refer you to dental if needed.     Other Low-Cost Community Dental Services:   [facility] - [phone] (ext [phone])   [address] [place] [address]  Dr. Civils - [phone]   [address]    [employer] - [phone]   [address]           [facility]           [address], [place], [place], [address]           [phone], Ext. [phone]           2nd and 4th Thursday of the month at 6:30am (Simple extractions only - no wisdom teeth or surgery) First come/First serve -First 10 clients served           [facility] ([place] and [place] residents only)          [address], [place], [place], [address]            [phone]                    [place] [facility]           [phone]          [place] [facility]          [phone]         [facility]          [phone]   Transportation Options:  Ambulance - 911 - $250-$700 per ride Family Member to accompany patient (if stable) - [place] [employer] - (336) 355-6499  PART - (336) 662-0002  Taxi - (336) 272-5112 - [place]  SCAT - (336) 333-6589 (Application required)  [place] [facility] - (336) 641-4848  

## 2020-11-17 ENCOUNTER — Ambulatory Visit (INDEPENDENT_AMBULATORY_CARE_PROVIDER_SITE_OTHER): Payer: Medicaid Other

## 2020-11-17 ENCOUNTER — Other Ambulatory Visit: Payer: Self-pay

## 2020-11-17 ENCOUNTER — Ambulatory Visit: Payer: Medicaid Other

## 2020-11-17 VITALS — BP 117/80 | HR 76 | Wt 134.3 lb

## 2020-11-17 DIAGNOSIS — Z3042 Encounter for surveillance of injectable contraceptive: Secondary | ICD-10-CM | POA: Diagnosis not present

## 2020-11-17 MED ORDER — MEDROXYPROGESTERONE ACETATE 150 MG/ML IM SUSP
150.0000 mg | Freq: Once | INTRAMUSCULAR | Status: AC
  Administered 2020-11-17: 150 mg via INTRAMUSCULAR

## 2020-11-17 NOTE — Progress Notes (Signed)
[name] here for Depo-Provera Injection. Injection administered without complication. Patient will return in 3 months for next injection between 02/02/21 and 02/16/21. Next annual visit due June 2023.   Pt reports increased stress due to financially supporting her family. Reports history of depression and PTSD. Has been trying to find a mental health counselor. Has seen [facility] in the past, but would like more frequent, long-term therapy. Patient completed self-referral online to [facility] while in office with me. Will follow up with patient next week.   Annabell Howells, RN 11/17/2020  3:33 PM

## 2020-11-17 NOTE — Progress Notes (Signed)
Agree with A & P. 

## 2021-02-02 ENCOUNTER — Other Ambulatory Visit: Payer: Self-pay

## 2021-02-02 ENCOUNTER — Ambulatory Visit (INDEPENDENT_AMBULATORY_CARE_PROVIDER_SITE_OTHER): Payer: Medicaid Other | Admitting: General Practice

## 2021-02-02 VITALS — BP 108/66 | HR 98 | Ht 66.0 in | Wt 134.0 lb

## 2021-02-02 DIAGNOSIS — Z3042 Encounter for surveillance of injectable contraceptive: Secondary | ICD-10-CM

## 2021-02-02 MED ORDER — MEDROXYPROGESTERONE ACETATE 150 MG/ML IM SUSP
150.0000 mg | Freq: Once | INTRAMUSCULAR | Status: AC
  Administered 2021-02-02: 150 mg via INTRAMUSCULAR

## 2021-02-02 NOTE — Progress Notes (Signed)
Patient was assessed and managed by nursing staff during this encounter. I have reviewed the chart and agree with the documentation and plan.   Gaylan Gerold, MSN, CNM, [facility] 02/02/21 8:26 PM

## 2021-02-02 NOTE — Progress Notes (Signed)
Alice Castillo here for Depo-Provera Injection. Injection administered. Patient seemed to have a lot of anxiety with depo injection and reports history of passing out with injections. She appeared to have a syncopal episode. BP after 108/66. Patient felt better after sitting a couple minutes and chewing on some ice. Patient will return in 3 months for next injection between Feb 23 and Mar 9. Recommended she may want to schedule regular visit to discuss other birth control options. Next annual visit due June 2023. PHQ 9 & GAD 7 elevated- [name] will reach out with resource options.  Derinda Late, RN 02/02/2021  3:15 PM

## 2021-04-24 ENCOUNTER — Other Ambulatory Visit: Payer: Self-pay

## 2021-04-24 ENCOUNTER — Ambulatory Visit (INDEPENDENT_AMBULATORY_CARE_PROVIDER_SITE_OTHER): Payer: Medicaid Other

## 2021-04-24 VITALS — BP 131/88 | HR 89 | Wt 136.0 lb

## 2021-04-24 DIAGNOSIS — Z3042 Encounter for surveillance of injectable contraceptive: Secondary | ICD-10-CM | POA: Diagnosis not present

## 2021-04-24 MED ORDER — MEDROXYPROGESTERONE ACETATE 150 MG/ML IM SUSP
150.0000 mg | Freq: Once | INTRAMUSCULAR | Status: AC
  Administered 2021-04-24: 150 mg via INTRAMUSCULAR

## 2021-04-24 NOTE — Progress Notes (Signed)
Alice Castillo here for Depo-Provera Injection. Injection administered without complication. Patient will return in 3 months for next injection between 07/10/21 and 07/24/21. Next annual visit due June 2023.   Pt reports recent change in living situation. Recently unemployed. Feelings of increased financial strain. Reports depression is worsening. PHQ-9: 13 and GAD-7: 14; denies thoughts of harm or SI. Has attempted to establish with counselor, but was rescheduled multiple times. Would like to schedule follow-up appt with [facility].  Annabell Howells, RN 04/24/2021  2:55 PM

## 2021-04-25 NOTE — BH Specialist Note (Signed)
Integrated Behavioral Health via Telemedicine Visit ? ?04/28/2021 ?[name] ?[id number] ? ?Number of [place] Clinician visits: 1- Initial Visit ? ?Session Start time: [phone] ?  ?Session End time: 0958 ? ?Total time in minutes: 64 ? ? ?Referring Provider: Darron Doom, MD ?Patient/Family location: [place], [place] ?[facility] Provider location: [facility] for [place] at [facility] ? ?All persons participating in visit: Patient Alice Castillo and [place]  ? ?Types of Service: Individual psychotherapy and Video visit ? ?I connected with [name] and/or Alaija Walmer's  toddler son  via  Telephone or Geologist, engineering  (Video is Caregility application) and verified that I am speaking with the correct person using two identifiers. Discussed confidentiality: Yes  ? ?I discussed the limitations of telemedicine and the availability of in person appointments.  Discussed there is a possibility of technology failure and discussed alternative modes of communication if that failure occurs. ? ?I discussed that engaging in this telemedicine visit, they consent to the provision of behavioral healthcare and the services will be billed under their insurance. ? ?Patient and/or legal guardian expressed understanding and consented to Telemedicine visit: Yes  ? ?Presenting Concerns: ?Patient and/or family reports the following symptoms/concerns: Distress at housing instability (lost money to housing scam, stressful move in with mother; now at friend's place), loss of job/income (due to pressure to take care of her mother and nieces); not feeling her contributions are understood or respected by family. ?Duration of problem: Ongoing with recent increased stressors; Severity of problem:  moderately severe ? ?Patient and/or Family's Strengths/Protective Factors: ?Sense of purpose ? ?Goals Addressed: ?Patient will: ? Reduce symptoms of: anxiety, depression, mood  instability, and stress  ? Demonstrate ability to: Increase healthy adjustment to current life circumstances ? ?Progress towards Goals: ?Ongoing ? ?Interventions: ?Interventions utilized:  Armed forces logistics/support/administrative officer and Supportive Reflection ?Standardized Assessments completed:  PHQ9/GAD7 given within past two weeks ? ?Patient and/or Family Response: Patient agrees with treatment plan. ? ? ?Assessment: ?Patient currently experiencing PTSD (as previously diagnosed) and Psychosocial stress.  ? ?Patient may benefit from psychoeducation and brief therapeutic interventions regarding coping with symptoms of depression, anxiety, mood instability, stress ?. ? ?Plan: ?Follow up with behavioral health clinician on : Two weeks ?Behavioral recommendations:  ?-Accept referral to [facility] outpatient psychiatry and therapy ?-Accept referral to [employer] ?-Consider additional community resources on After Visit Summary, as discussed ?-Consider setting healthy boundaries with family members going forward ?Referral(s): Integrated Orthoptist (In Clinic) and [place] (LME/Outside Clinic) ? ?I discussed the assessment and treatment plan with the patient and/or parent/guardian. They were provided an opportunity to ask questions and all were answered. They agreed with the plan and demonstrated an understanding of the instructions. ?  ?They were advised to call back or seek an in-person evaluation if the symptoms worsen or if the condition fails to improve as anticipated. ? ?Garlan Fair, LCSW ? ?Depression screen [facility] 2/9 04/24/2021 02/02/2021 08/16/2020 06/01/2020 05/30/2020  ?Decreased Interest 2 - 2 3 3   ?Down, Depressed, Hopeless 3 1 2  - 3  ?PHQ - 2 Score 5 1 4 3 6   ?Altered sleeping 3 2 2  - 3  ?Tired, decreased energy 2 3 2  - 3  ?Change in appetite 1 2 2  - 3  ?Feeling bad or failure about yourself  0 1 2 - 2  ?Trouble concentrating 1 2 2  - 1  ?Moving slowly or fidgety/restless 1 0 3 - 1  ?  Suicidal  thoughts 0 0 0 - 0  ?PHQ-9 Score 13 11 17  - 19  ?Difficult doing work/chores Very difficult - - - -  ?Some recent data might be hidden  ? ?GAD 7 : Generalized Anxiety Score 04/24/2021 02/02/2021 08/16/2020 05/30/2020  ?Nervous, Anxious, on Edge 3 3 3  0  ?Control/stop worrying 1 0 1 0  ?Worry too much - different things 1 1 1  0  ?Trouble relaxing 3 3 2 3   ?Restless 2 2 1  0  ?Easily annoyed or irritable 3 3 3 2   ?Afraid - awful might happen 1 0 2 1  ?Total GAD 7 Score 14 12 13 6   ?Anxiety Difficulty Very difficult - - -  ? ? ? ?

## 2021-04-28 ENCOUNTER — Ambulatory Visit (INDEPENDENT_AMBULATORY_CARE_PROVIDER_SITE_OTHER): Payer: Medicaid Other | Admitting: Clinical

## 2021-04-28 DIAGNOSIS — F4310 Post-traumatic stress disorder, unspecified: Secondary | ICD-10-CM | POA: Diagnosis not present

## 2021-04-28 DIAGNOSIS — Z658 Other specified problems related to psychosocial circumstances: Secondary | ICD-10-CM

## 2021-04-28 NOTE — Patient Instructions (Signed)
[facility] at [facility] ?[place] ?[place], [place] [address] ?[phone] (main office) ?[phone] (Kacie Huxtable's office) ? ? ?[facility]  ? [phone] or [phone] ?[facility] 24/7 ?[address] [place], [place] ?[email or web address] ?*Interpreters available ?*Accepts all insurance and uninsured for Urgent Care needs ?*Accepts Medicaid and uninsured for outpatient treatment  ?  ? ? ?[place] [employer]  ?(Childcare options, Early childcare development, etc.) ?www.WomenInsider.com.ee ? ?Women's Resource Center ?Www.womenscentergso.org  ? ?Housing Resources ?                   ?[employer] (serves [place], [place], [place], [place], [place], [place], [place], [place], [place], [place], [place], [place], and [place] counties) ?[address], [place], [place] [address] ?([phone] ?www.https://www.johnston.biz/  ?**Rental assistance, Home Rehabilitation,Weatherization Assistance Program, Forensic psychologist, Housing Voucher Program ? ?[facility] for Housing and [employer] Studies: ?Emergency [place] to residents of [place], [place], and [place] ?Make sure you have your documents ready, including:  ?(Household income verification: 2 months pay stubs, unemployment/social security award letter, statement of no income for all household members over 52) ?Photo ID for all household members over 51 ?Utility Bill/Rent Ledger/Lease: must show past due amount for utilities/rent, or the rental agreement if rent is current ?2. Start your application online or by paper (in [place] or [place]) at:  ?   http://boyd-evans.org/  ?3. Once you have completed the online application, you will get an email confirmation message from the county. Expect to hear back by phone or email at least 6-10  weeks from submitting your application.  ?4. While you wait:  ?Call [phone] to check in on your application ?Let your landlord know that you've applied. Your landlord will be asked to submit documents (W-9) during this application process. Payments will be made directly to the landlord/property management company and utility company ?Rent or utility assistance for [employer], [place], and [place] ?Apply at https://rb.gy/dvxbfv ?Questions? Call or email Renee at [phone] or [email or web address]  ? ?Eviction Mediation Program: The HOPE Program ?Https://www.rebuild.http://mills-williams.net/ ?HOPE Progam serves low-income renters in [place] counties, defined as less than or equal to 80% of the area median income for the county where the renter lives. In the following 12 counties, you should apply to your local rent and utility assistance program INSTEAD OF the HOPE Program: [place], [place], [place], [place], [place], [place], [place], [place], [place], [place], [place], [place]  ?If you live outside of [facility], contact [place] call center at [phone] to talk to a Program Representative Monday-Friday, 8am-5pm ?Note that Native American tribes also received federal funding for rent and utility assistance programs. Recognized members of the following tribes will be served by programs managed by tribal governments, including: [place], [place], [place], [place] and [place] ?Eviction Mediation Coordinator, Renee, [phone] [email or web address] ?Housing Resources Navigator, [place], [phone] [email or web address]  ? ?Housing Resources [place] ? ?[facility] ?[address], [place], [place] [address] ?(([phone] ?www.gha-Maverick.org  ? ?[employer] ?[place], [place], [place] [address] ?([phone] ?https://manning.com/ ?**Programs include:  Foreclosure Prevention and Housing Counseling, Healthy Doctor, general practice, Homeless Prevention and Housing Assistance ? ?[facility] ?[address], [place], [place], [place] [address] ?((732)400-7766 ?www.https://www.farmer-stevens.info/ ?**housing applications/recertification; tax payment relief/exemption under specific qualifications ? ?[facility] ?[address], [place], [place] [address] ?www.onlinegreensboro.com/~maryshouse ?**  transitional housing for women in recovery who have minor children or are pregnant ? ?[facility] ?[place], [place], [place] [address] ?www.ywcagsonc.org  ?**emergency shelter and support services for families facing homelessness ? ?[facility] ?[address] [place], [place] [address] ?([phone] ?www.youthfocus.org ?**transitional housing to pregnant women; emergency housing for youth who have run away, are experiencing a family crisis, are victims of abuse or neglect, or are homeless ? ?[facility] ?[address], [place], [place] [address] ?((587) [phone] ?ircgso.org ?**Drop-in center for people experiencing homelessness; overnight warming center when temperature is 25 degrees or below ? ?[employer] ?[address] [place], [place] [address] ?([phone] ?https://reentrystaffingagency.org/ ?**help with affordable housing to people experiencing homelessness or unemployment due to incarceration ? ?[employer] ?[address], [place], [place] [address] ?([phone] ?www.greensborourbanministry.org  ?**emergency and transitional housing, rent/mortgage assistance, utility assistance ? ?[facility] ?[address] [place], [place] [address] ?(w36) 336-1224 ?www.salvationarmyofgreensboro.org ?**emergency and transitional housing ? ?[facility] ?[place], [place], [place] [address] ?((936)515-0217 ?Www.habitatgreensboro.org   ? ?[employer] ?[place], [place], [place] [address] ?((325)759-7785 ?https://chshousing.org ?**Home Ownership/Affordable Housing Program and Home Repair Program ? ?[employer] ?[place], [place], [place] [address] ?((587) [phone] ?www.GotVisitors.hu ?**home buyer education courses, foreclosure prevention ? ?[facility] ?[address] [place], [place] [address] ?(([phone] ?WirelessNovelties.no ?**Environmental Exposure Assessment (investigation of homes where either children or pregnant women with a confirmed elevated blood lead level reside) ? ?[employer] [facility] ?[place], [place], [place] [address] ?([phone] ?http://www.perez.com/ ?**Home Expense Assistance/Repairs Program; offers home accessibility updates, such as ramps or bars in the bathroom ? ?[facility] ?[address] [place],  [address] ?([phone] ?https://www.self-help.org/locations/[place]-branch ?**Offers credit-building and banking services to people unable to use traditional banking ? ? ? ? ?

## 2021-05-01 NOTE — BH Specialist Note (Signed)
Integrated Behavioral Health via Telemedicine Visit  05/15/2021 [place] Castillo [id number]  Number of Integrated Behavioral Health Clinician visits: 2- Second Visit  Session Start time: 1017   Session End time: 1051  Total time in minutes: 34   Referring Provider: Tinnie Gens, MD Patient/Family location: Home [facility] Provider location: [facility]  All persons participating in visit: Patient [place] Castillo and Alice Castillo, Alice Castillo   Types of Service: Individual psychotherapy and Video visit  I connected with [place] Castillo  n/a  via  Telephone or Video Enabled Telemedicine Application  (Video is Caregility application) and verified that I am speaking with the correct person using two identifiers. Discussed confidentiality: Yes   I discussed the limitations of telemedicine and the availability of in person appointments.  Discussed there is a possibility of technology failure and discussed alternative modes of communication if that failure occurs.  I discussed that engaging in this telemedicine visit, they consent to the provision of behavioral healthcare and the services will be billed under their insurance.  Patient and/or legal guardian expressed understanding and consented to Telemedicine visit: Yes   Presenting Concerns: Patient and/or family reports the following symptoms/concerns: Recent panic/emotional breakdown over bus mix-up with child, along with stressful job search. Duration of problem: Ongoing; Severity of problem:  moderately severe  Patient and/or Family's Strengths/Protective Factors: Sense of purpose and Physical Health (exercise, healthy diet, medication compliance, etc.)  Goals Addressed: Patient will:  Reduce symptoms of: anxiety, depression, mood instability, and stress   Progress towards Goals: Ongoing  Interventions: Interventions utilized:  Supportive Reflection Standardized  Assessments completed: Not Needed  Patient and/or Family Response: Patient agrees with treatment plan.   Assessment: Patient currently experiencing PTSD (previously diagnosed); Psychosocial stress.   Patient may benefit from continued psychoeducation and brief therapeutic interventions regarding coping with symptoms of anxiety, depression, mood instability, stress.  Plan: Follow up with behavioral health clinician on : Two weeks Behavioral recommendations:  -Continue accept referral to [facility] for outpatient psychiatry and ongoing therapy (they will call to schedule) -Continue setting healthy boundaries with family members; continue job search Referral(s): Integrated [employer] (In Clinic)  I discussed the assessment and treatment plan with the patient and/or parent/guardian. They were provided an opportunity to ask questions and all were answered. They agreed with the plan and demonstrated an understanding of the instructions.   They were advised to call back or seek an in-person evaluation if the symptoms worsen or if the condition fails to improve as anticipated.  Valetta Close [place] Griffith, LCSW

## 2021-05-15 ENCOUNTER — Ambulatory Visit (INDEPENDENT_AMBULATORY_CARE_PROVIDER_SITE_OTHER): Payer: Medicaid Other | Admitting: Clinical

## 2021-05-15 DIAGNOSIS — Z658 Other specified problems related to psychosocial circumstances: Secondary | ICD-10-CM

## 2021-05-15 DIAGNOSIS — F4310 Post-traumatic stress disorder, unspecified: Secondary | ICD-10-CM

## 2021-05-16 NOTE — BH Specialist Note (Signed)
Integrated Behavioral Health via Telemedicine Visit ? ?05/29/2021 ?[place] Castillo ?[id number] ? ?Number of [place] Clinician visits: 3- Third Visit ? ?Session Start time: 9622 ?  ?Session End time: 2979 ? ?Total time in minutes: 44 ? ? ?Referring Provider: Darron Doom, MD ?Patient/Family location: Home ?[facility] Provider location: [facility] [employer] [facility] at [facility] ? ?All persons participating in visit: Patient [place] Castillo and [place]  ? ?Types of Service: Individual psychotherapy and Video visit ? ?I connected with [place] Castillo and/or Alice Castillo's  n/a  via  Telephone or Video Enabled Telemedicine Application  (Video is Caregility application) and verified that I am speaking with the correct person using two identifiers. Discussed confidentiality: Yes  ? ?I discussed the limitations of telemedicine and the availability of in person appointments.  Discussed there is a possibility of technology failure and discussed alternative modes of communication if that failure occurs. ? ?I discussed that engaging in this telemedicine visit, they consent to the provision of behavioral healthcare and the services will be billed under their insurance. ? ?Patient and/or legal guardian expressed understanding and consented to Telemedicine visit: Yes  ? ?Presenting Concerns: ?Patient and/or family reports the following symptoms/concerns: Overwhelmed with relationship problems and life stress/job hunt ?Duration of problem: Increasing over time; Severity of problem:  moderately severe ? ?Patient and/or Family's Strengths/Protective Factors: ?Sense of purpose and Physical Health (exercise, healthy diet, medication compliance, etc.) ? ?Goals Addressed: ?Patient will: ? Reduce symptoms of: anxiety, depression, mood instability, and stress  ? ?Progress towards Goals: ?Ongoing ? ?Interventions: ?Interventions utilized:  Supportive Reflection ?Standardized Assessments completed:  Not Needed ? ?Patient and/or Family Response: Patient agrees with treatment plan. ? ? ?Assessment: ?Patient currently experiencing PTSD (previously diagnosed) and Psychosocial stress.  ? ?Patient may benefit from continued brief therapeutic interventions. ? ?Plan: ?Follow up with behavioral health clinician on : Two weeks ?Behavioral recommendations:  ?-Continue plan to attend [facility] appointment in May ?-Continue setting healthy boundaries in relationships  ?-Continue daily job search with at least one day "off" the search weekly for self-care ?Referral(s): Integrated Orthoptist (In Clinic) and [place] (LME/Outside Clinic) ? ?I discussed the assessment and treatment plan with the patient and/or parent/guardian. They were provided an opportunity to ask questions and all were answered. They agreed with the plan and demonstrated an understanding of the instructions. ?  ?They were advised to call back or seek an in-person evaluation if the symptoms worsen or if the condition fails to improve as anticipated. ? ?Garlan Fair, LCSW ?

## 2021-05-29 ENCOUNTER — Ambulatory Visit (INDEPENDENT_AMBULATORY_CARE_PROVIDER_SITE_OTHER): Payer: Medicaid Other | Admitting: Clinical

## 2021-05-29 DIAGNOSIS — F4310 Post-traumatic stress disorder, unspecified: Secondary | ICD-10-CM

## 2021-05-29 DIAGNOSIS — Z658 Other specified problems related to psychosocial circumstances: Secondary | ICD-10-CM | POA: Diagnosis not present

## 2021-06-01 NOTE — BH Specialist Note (Signed)
Integrated Behavioral Health via Telemedicine Visit ? ?06/15/2021 ?[place] Castillo ?[id number] ? ?Number of [place] Clinician visits: 4- Fourth Visit ? ?Session Start time: 1045 ?  ?Session End time: 4403 ? ?Total time in minutes: 38 ? ? ?Referring Provider: Darron Doom, MD ?Patient/Family location: Home ?[facility] Provider location: [facility] [employer] at [facility] ? ?All persons participating in visit: Patient [place] Castillo and [place]  ? ?Types of Service: Individual psychotherapy and Video visit ? ?I connected with [name] and/or Vivia Beighley's  n/a  via  Telephone or Video Enabled Telemedicine Application  (Video is Caregility application) and verified that I am speaking with the correct person using two identifiers. Discussed confidentiality: Yes  ? ?I discussed the limitations of telemedicine and the availability of in person appointments.  Discussed there is a possibility of technology failure and discussed alternative modes of communication if that failure occurs. ? ?I discussed that engaging in this telemedicine visit, they consent to the provision of behavioral healthcare and the services will be billed under their insurance. ? ?Patient and/or legal guardian expressed understanding and consented to Telemedicine visit: Yes  ? ?Presenting Concerns: ?Patient and/or family reports the following symptoms/concerns: Feeling misunderstood in communication with others; stressful job Secretary/administrator. ?Duration of problem: Ongoing; Severity of problem:  moderately severe ? ?Patient and/or Family's Strengths/Protective Factors: ?Sense of purpose and Physical Health (exercise, healthy diet, medication compliance, etc.) ? ?Goals Addressed: ?Patient will: ? Reduce symptoms of: anxiety, depression, mood instability, and stress  ? ?Progress towards Goals: ?Ongoing ? ?Interventions: ?Interventions utilized:  Armed forces logistics/support/administrative officer ?Standardized Assessments completed: Not  Needed ? ?Patient and/or Family Response: Patient agrees with treatment plan. ? ? ?Assessment: ?Patient currently experiencing PTSD (as previously diagnosed) and Psychosocial stress.  ? ?Patient may benefit from continued brief therapeutic interventions . ? ?Plan: ?Follow up with behavioral health clinician on : Two weeks ?Behavioral recommendations:  ?-Continue plan to attend [facility] appointment 07/21/2021 ?-Continue advocating for personal needs in relationships  ?-Continue active job search daily, including days off (at least once/daily) for self-care and emotional wellness ?Referral(s): [place] (In Clinic) ? ?I discussed the assessment and treatment plan with the patient and/or parent/guardian. They were provided an opportunity to ask questions and all were answered. They agreed with the plan and demonstrated an understanding of the instructions. ?  ?They were advised to call back or seek an in-person evaluation if the symptoms worsen or if the condition fails to improve as anticipated. ? ?Garlan Fair, LCSW ? ? ?  04/24/2021  ?  2:11 PM 02/02/2021  ?  4:26 PM 08/16/2020  ? 11:31 AM 06/01/2020  ? 12:06 PM 05/30/2020  ?  2:06 PM  ?Depression screen PHQ 2/9  ?Decreased Interest '2  2 3 3  '$ ?Down, Depressed, Hopeless '3 1 2  3  '$ ?PHQ - 2 Score '5 1 4 3 6  '$ ?Altered sleeping '3 2 2  3  '$ ?Tired, decreased energy '2 3 2  3  '$ ?Change in appetite '1 2 2  3  '$ ?Feeling bad or failure about yourself  0 '1 2  2  '$ ?Trouble concentrating '1 2 2  1  '$ ?Moving slowly or fidgety/restless 1 0 3  1  ?Suicidal thoughts 0 0 0  0  ?PHQ-9 Score '13 11 17  19  '$ ?Difficult doing work/chores Very difficult      ? ? ?  04/24/2021  ?  2:14 PM 02/02/2021  ?  4:26 PM  08/16/2020  ? 11:31 AM 05/30/2020  ?  2:07 PM  ?GAD 7 : Generalized Anxiety Score  ?Nervous, Anxious, on Edge '3 3 3 '$ 0  ?Control/stop worrying 1 0 1 0  ?Worry too much - different things '1 1 1 '$ 0  ?Trouble relaxing '3 3 2 3  '$ ?Restless '2 2 1 '$ 0  ?Easily annoyed or irritable '3 3 3 2   '$ ?Afraid - awful might happen 1 0 2 1  ?Total GAD 7 Score '14 12 13 6  '$ ?Anxiety Difficulty Very difficult     ? ? ? ?

## 2021-06-15 ENCOUNTER — Ambulatory Visit (INDEPENDENT_AMBULATORY_CARE_PROVIDER_SITE_OTHER): Payer: Medicaid Other | Admitting: Clinical

## 2021-06-15 DIAGNOSIS — Z658 Other specified problems related to psychosocial circumstances: Secondary | ICD-10-CM

## 2021-06-15 DIAGNOSIS — F4310 Post-traumatic stress disorder, unspecified: Secondary | ICD-10-CM | POA: Diagnosis not present

## 2021-06-15 NOTE — BH Specialist Note (Addendum)
Integrated Behavioral Health via Telemedicine Visit ? ?06/29/2021 ?Areal Cochrane ?[id number] ? ?Number of [place] Clinician visits: 4- Fourth Visit ? ?Session Start time: 6712 ?  ?Session End time: 1117 ? ?Total time in minutes: 28 ? ? ?Referring Provider: Darron Doom, MD ?Patient/Family location: Home ?[facility] Provider location: [facility] [employer] at [facility] ? ?All persons participating in visit: Patient [place] Castillo and [place]  ? ?Types of Service: Individual psychotherapy and Video visit ? ?I connected with [name] and/or Kalani Trammel's  n/a  via  Telephone or Video Enabled Telemedicine Application  (Video is Caregility application) and verified that I am speaking with the correct person using two identifiers. Discussed confidentiality: Yes  ? ?I discussed the limitations of telemedicine and the availability of in person appointments.  Discussed there is a possibility of technology failure and discussed alternative modes of communication if that failure occurs. ? ?I discussed that engaging in this telemedicine visit, they consent to the provision of behavioral healthcare and the services will be billed under their insurance. ? ?Patient and/or legal guardian expressed understanding and consented to Telemedicine visit: Yes  ? ?Presenting Concerns: ?Patient and/or family reports the following symptoms/concerns: Recent breakup with child's father; concern about childcare, housing, transportation, uncertainty over future finances. ?Duration of problem: Past week; Severity of problem:  moderately severe ? ?Patient and/or Family's Strengths/Protective Factors: ?Sense of purpose and Physical Health (exercise, healthy diet, medication compliance, etc.) ? ?Goals Addressed: ?Patient will: ? Reduce symptoms of: anxiety, depression, mood instability, and stress  ? Increase knowledge and/or ability of: stress reduction  ? Demonstrate ability to: Increase  healthy adjustment to current life circumstances ? ?Progress towards Goals: ?Ongoing ? ?Interventions: ?Interventions utilized:  Solution-Focused Strategies and Link to [employer] ?Standardized Assessments completed: Not Needed ? ?Patient and/or Family Response: Patient agrees with treatment plan. ? ? ?Assessment: ?Patient currently experiencing PTSD (as previously diagnosed) and Psychosocial stress.  ? ?Patient may benefit from continued therapeutic interventions. ? ?Plan: ?Follow up with behavioral health clinician on : Two weeks ?Behavioral recommendations:  ?-Continue plan to attend initial [facility] appointment on 07/21/2021 ?-Continue plan to work this coming weekend, if childcare still available with child's father ?-Read through After Visit Summary; consider contacting at least one community agency daily (start with childcare resource)  ?-Prioritize routine eating and sleeping for the next two weeks as daily self-care ?Referral(s): Integrated Orthoptist (In Clinic) and [employer] Resources:  Finances, Housing, Transportation, and Childcare ? ?I discussed the assessment and treatment plan with the patient and/or parent/guardian. They were provided an opportunity to ask questions and all were answered. They agreed with the plan and demonstrated an understanding of the instructions. ?  ?They were advised to call back or seek an in-person evaluation if the symptoms worsen or if the condition fails to improve as anticipated. ? ?Garlan Fair, LCSW ?

## 2021-06-29 ENCOUNTER — Ambulatory Visit (INDEPENDENT_AMBULATORY_CARE_PROVIDER_SITE_OTHER): Payer: Medicaid Other | Admitting: Clinical

## 2021-06-29 DIAGNOSIS — Z658 Other specified problems related to psychosocial circumstances: Secondary | ICD-10-CM

## 2021-06-29 DIAGNOSIS — F4310 Post-traumatic stress disorder, unspecified: Secondary | ICD-10-CM | POA: Diagnosis not present

## 2021-06-29 NOTE — Patient Instructions (Signed)
[facility] ?[address] ?[place], [place] [address] ?[phone] (main office) ?[phone] (Cordarius Benning's office) ? ? ?[place] [employer]  ?(Childcare options, Early childcare development, etc.) ?www.WomenInsider.com.ee ? ?Women's Resource Center ?www.womenscentergso.org ? ?Housing Resources ?                   ?[employer] (serves [place], [place], [employer], [place], [place], [place], [place], [place], [place], [place], [place], [place], and [place] counties) ?[address], [place], [place] [address] ?((831)[phone] ?www.https://www.johnston.biz/  ?**Rental assistance, Home Rehabilitation,Weatherization Assistance Program, Forensic psychologist, Housing Voucher Program ? ?[facility] for [facility] and [employer] Studies: ?Emergency [place] to residents of [place], [place], and [place] ?Make sure you have your documents ready, including:  ?(Household income verification: 2 months pay stubs, unemployment/social security award letter, statement of no income for all household members over 26) ?Photo ID for all household members over 43 ?Utility Bill/Rent Ledger/Lease: must show past due amount for utilities/rent, or the rental agreement if rent is current ?2. Start your application online or by paper (in [place] or [place]) at:  ?   http://boyd-evans.org/  ?3. Once you have completed the online application, you will get an email confirmation message from the county. Expect to hear back by phone or email at least 6-10 weeks from submitting your application.  ?4. While you wait:  ?Call [phone] to check in on your application ?Let your landlord know that you've applied. Your landlord will be asked to submit documents (W-9) during this application process. Payments will be made directly to the landlord/property management company  and utility company ?Rent or utility assistance for [employer], [place], and [place] ?Apply at https://rb.gy/dvxbfv ?Questions? Call or email Renee at [phone] or [email or web address]  ? ?Eviction Mediation Program: The HOPE Program ?Https://www.rebuild.http://mills-williams.net/ ?HOPE Progam serves low-income renters in [place] counties, defined as less than or equal to 80% of the area median income for the county where the renter lives. In the following 12 counties, you should apply to your local rent and utility assistance program INSTEAD OF the HOPE Program: [place], [place], [place], [place], [place], [place], [place], [place], [place], [place], [place], [place]  ?If you live outside of [facility], contact [place] call center at [phone] to talk to a Program Representative Monday-Friday, 8am-5pm ?Note that Native American tribes also received federal funding for rent and utility assistance programs. Recognized members of the following tribes will be served by programs managed by tribal governments, including: [place], [place], [place], [place] and [place] ?Eviction Mediation Coordinator, Renee, [phone] [email or web address] ?Housing Resources Navigator, [place], [phone] [email or web address]  ? ?Housing Resources [place] ? ?[place] ?[address] [place], [place] [address] ?((346)406-7758 ?www.gha-[place].org  ? ?[employer] ?[place], [place], [place] [address] ?([phone] ?https://manning.com/ ?**Programs include: Foreclosure Prevention and Housing Counseling, Healthy Doctor, general practice, Homeless Prevention and Housing Assistance ? ?[place] ?[address], [place], [place], [place] [address] ?((989)623-4334 ?www.https://www.farmer-stevens.info/ ?**housing applications/recertification; tax payment  relief/exemption under specific qualifications ? ?[facility] ?[address] [place], [place] [address] ?www.onlinegreensboro.com/~maryshouse ?**transitional housing for women in recovery who have minor children or are pregnant ? ?[facility] ?[place], [place], [place] [address] ?www.ywcagsonc.org  ?**emergency shelter and support services for families facing homelessness ? ?[facility] ?[address], [place], [place] [address] ?(831-688-5376 ?  www.youthfocus.org ?**transitional housing to pregnant women; emergency housing for youth who have run away, are experiencing a family crisis, are victims of abuse or neglect, or are homeless ? ?[place] ?[address], [place], [place] [address] ?(206-339-6404 ?ircgso.org ?**Drop-in center for people experiencing homelessness; overnight warming center when temperature is 25 degrees or below ? ?[facility] ?[address], [place], [place] [address] ?(3315477461 ?https://reentrystaffingagency.org/ ?**help with affordable housing to people experiencing homelessness or unemployment due to incarceration ? ?[employer] ?[address] [place], [place] [address] ?((725)762-5230 ?www.greensborourbanministry.org  ?**emergency and transitional housing, rent/mortgage assistance, utility assistance ? ?[facility] ?[address], [place], [place] [address] ?(w36) 654-6503 ?www.salvationarmyofgreensboro.org ?**emergency and transitional housing ? ?[facility] ?[address] [place], [place], [place] [address] ?((586) [phone] ?Www.habitatgreensboro.org  ? ?[employer] ?[place], [place], [place] [address] ?(901-234-6252 ?https://chshousing.org ?**Home Ownership/Affordable Housing Program and Home Repair Program ? ?[employer] ?[place], [place], [place] [address] ?(939-864-9418 ?www.GotVisitors.hu ?**home buyer education courses, foreclosure prevention ? ?[facility] ?[address], [place], [place] [address] ?((301)563-8527 ?WirelessNovelties.no ?**Environmental Exposure Assessment (investigation of homes where either children or pregnant women with a confirmed elevated blood lead level reside) ? ?[employer] [facility] ?[place], [place], [place] [address] ?([phone] ?http://www.perez.com/ ?**Home Expense Assistance/Repairs Program; offers home accessibility updates, such as ramps or bars in the bathroom ? ?[facility] ?[address] [place], [place] [address] ?(5197449742 ?https://www.self-help.org/locations/[place]-branch ?**Offers credit-building and banking services to people unable to use traditional banking ? ? ?Housing Resources [employer] ? ?[place] [facility] [employer] ?[address], [place], [place] [address] ?([phone] ?RoulettePays.com.br  ? ?[employer] ?[address], [place], [place] [address] ?([phone]  ?ClubMonetize.fr ?**housing applications/recertification, emergency and transitional housing ? ?[facility] [employer] ?[address] [place], [place] [address] ?([place] ?www.odm-hp.org  ?**emergency and permanent housing; rent/mortgage payment assistance ? ?[facility] for [employer], [place] and [place] ?[address], [place], [place] [address] ?([place] ?ArchitectReviews.com.au ? ?[facility] [place] [employer] ?[place], [place], [place] [address] ?Administrator.familyservice-piedmont.org ?**emergency shelter for victims of domestic violence and sexual assault ? ?[facility] ?[address] [place], [place] [address] ?([place] ?www.senior-resources-guilford.org ?**Home  expense assistance/repairs for older adults ? ?Trans[employer] Resources [place] ? ?[employer] ?[place], [place], [place] [address] ?https://www.[place].gov/departments/transport

## 2021-06-29 NOTE — BH Specialist Note (Signed)
Integrated Behavioral Health via Telemedicine Visit  07/13/2021 Alice Castillo [id number]  Number of Integrated Behavioral Health Clinician visits: 5-Fifth Visit  Session Start time: 0962   Session End time: 8366  Total time in minutes: 16   Referring Provider: Darron Doom, MD Patient/Family location: Home [address] Provider location: [facility] [place] [facility]  All persons participating in visit: Patient Alice Castillo and Alice Castillo   Types of Service: Individual psychotherapy and Telephone visit  I connected with Alice Castillo and/or Alice Castillo's  n/a  via  Telephone or Video Enabled Telemedicine Application  (Video is Caregility application) and verified that I am speaking with the correct person using two identifiers. Discussed confidentiality: Yes   I discussed the limitations of telemedicine and the availability of in person appointments.  Discussed there is a possibility of technology failure and discussed alternative modes of communication if that failure occurs.  I discussed that engaging in this telemedicine visit, they consent to the provision of behavioral healthcare and the services will be billed under their insurance.  Patient and/or legal guardian expressed understanding and consented to Telemedicine visit: Yes   Presenting Concerns: Patient and/or family reports the following symptoms/concerns: Stressful job search, relationship conflict; "passing out" happening more frequently, forgetfulness and stress over not understanding what is happening with her Medicaid coverage (being charged $100 copays for urgent care visits, etc.). Duration of problem: Ongoing; Severity of problem:  moderately severe  Patient and/or Family's Strengths/Protective Factors: Sense of purpose and Physical Health (exercise, healthy diet, medication compliance, etc.)  Goals Addressed: Patient will:  Reduce symptoms of: anxiety, depression, mood  instability, and stress    Demonstrate ability to: Increase healthy adjustment to current life circumstances  Progress towards Goals:  Ongoing  Interventions: Interventions utilized:  Solution-Focused Strategies Standardized Assessments completed: Not Needed  Patient and/or Family Response: Patient agrees with treatment plan.   Assessment: Patient currently experiencing PTSD and Psychosocial stress.   Patient may benefit from therapeutic intervention today.  Plan: Follow up with behavioral health clinician on : Call Dathan Attia at [phone], as needed. Behavioral recommendations:  -Go to job interview this afternoon  -Go in person to [facility] and request to speak to your Medicaid worker to clarify questions about coverage -Continue plan to attend initial [facility] appointment next week Referral(s): [place] (In Clinic) and [employer] Resources:  health care/DSS  I discussed the assessment and treatment plan with the patient and/or parent/guardian. They were provided an opportunity to ask questions and all were answered. They agreed with the plan and demonstrated an understanding of the instructions.   They were advised to call back or seek an in-person evaluation if the symptoms worsen or if the condition fails to improve as anticipated.  [name] Alice Theissen, LCSW

## 2021-07-10 ENCOUNTER — Ambulatory Visit (INDEPENDENT_AMBULATORY_CARE_PROVIDER_SITE_OTHER): Payer: Medicaid Other | Admitting: *Deleted

## 2021-07-10 VITALS — BP 104/74 | HR 78 | Ht 66.0 in | Wt 135.9 lb

## 2021-07-10 DIAGNOSIS — Z3042 Encounter for surveillance of injectable contraceptive: Secondary | ICD-10-CM | POA: Diagnosis not present

## 2021-07-10 MED ORDER — MEDROXYPROGESTERONE ACETATE 150 MG/ML IM SUSP
150.0000 mg | Freq: Once | INTRAMUSCULAR | Status: AC
  Administered 2021-07-10: 150 mg via INTRAMUSCULAR

## 2021-07-10 MED ORDER — MEDROXYPROGESTERONE ACETATE 150 MG/ML IM SUSP: 150.0000 mg | Freq: Once | INTRAMUSCULAR | Status: DC

## 2021-07-10 NOTE — Progress Notes (Addendum)
Here for depo-provera injection. Last depo-provera 04/24/21. Last exam 08/16/20. Last pap 08/11/19. I informed her she will need another annual exam before her next injection. States tends to pass out but if she holds ice states that helps. I gave her bag of ice and had her to lie back 90 degrees with legs flat on table for injection. Tolerated injection without complaint but then about 1 minute after injection states feels like will pass out , I laid her flat and she did pass out. Placed on her side, another nurse brought ammonia and patient came around, BP 89/ 53. Dr. Kennon Rounds in to see patient. Bp recheck 104/74. Patient pale. Had patient lie for a few minutes , then  sit 90 degree for a few minutes then upright for a few minutes,  then after a  few minutes had her stand. Still felt weak, had her sit in chair 5 minutes. After 5 minutes, patient color returned, states feels fine. Walked to lobby without any issues. Advised may sit in lobby if needed as long as she would like. She states she is fine and went to check out.  ?Alice Castillo ?

## 2021-07-13 ENCOUNTER — Ambulatory Visit (INDEPENDENT_AMBULATORY_CARE_PROVIDER_SITE_OTHER): Payer: Medicaid Other | Admitting: Clinical

## 2021-07-13 DIAGNOSIS — Z658 Other specified problems related to psychosocial circumstances: Secondary | ICD-10-CM

## 2021-07-13 DIAGNOSIS — F4310 Post-traumatic stress disorder, unspecified: Secondary | ICD-10-CM | POA: Diagnosis not present

## 2021-07-21 ENCOUNTER — Ambulatory Visit (INDEPENDENT_AMBULATORY_CARE_PROVIDER_SITE_OTHER): Payer: Medicaid Other | Admitting: Clinical

## 2021-07-21 DIAGNOSIS — F4310 Post-traumatic stress disorder, unspecified: Secondary | ICD-10-CM

## 2021-07-21 DIAGNOSIS — F332 Major depressive disorder, recurrent severe without psychotic features: Secondary | ICD-10-CM
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
## 2021-09-07 ENCOUNTER — Telehealth (INDEPENDENT_AMBULATORY_CARE_PROVIDER_SITE_OTHER): Payer: Medicaid Other | Admitting: Psychiatry

## 2021-09-07 ENCOUNTER — Encounter (HOSPITAL_COMMUNITY): Payer: Self-pay | Admitting: Psychiatry

## 2021-09-07 DIAGNOSIS — F32A Depression, unspecified: Secondary | ICD-10-CM

## 2021-09-07 DIAGNOSIS — F411 Generalized anxiety disorder: Secondary | ICD-10-CM | POA: Diagnosis not present

## 2021-09-07 DIAGNOSIS — F4310 Post-traumatic stress disorder, unspecified: Secondary | ICD-10-CM

## 2021-09-07 MED ORDER — HYDROXYZINE HCL 25 MG PO TABS: 25.0000 mg | ORAL_TABLET | Freq: Every evening | ORAL | 3 refills | Status: DC

## 2021-09-07 MED ORDER — PRAZOSIN HCL 1 MG PO CAPS: 1.0000 mg | ORAL_CAPSULE | Freq: Every day | ORAL | 3 refills | Status: DC

## 2021-09-07 MED ORDER — BUSPIRONE HCL 10 MG PO TABS: 10.0000 mg | ORAL_TABLET | Freq: Three times a day (TID) | ORAL | 3 refills | Status: DC
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
## 2021-09-22 NOTE — Progress Notes (Unsigned)
   ANNUAL EXAM Patient name: Alice Castillo MRN [id number]  Date of birth: 06-20-1995 Chief Complaint:   No chief complaint on file.  History of Present Illness:   Alice Castillo is a 26 y.o. [phone] female being seen today for a routine annual exam.   Current complaints: ***  No LMP recorded. Patient has had an injection.  Current birth control: Depo  Last pap: 08/11/19. Results were: NILM w/ HRHPV not done. H/O abnormal pap: no  Review of Systems:   Pertinent items are noted in HPI Denies any headaches, blurred vision, fatigue, shortness of breath, chest pain, abdominal pain, abnormal vaginal discharge/itching/odor/irritation, problems with periods, bowel movements, urination, or intercourse unless otherwise stated above. *** Pertinent History Reviewed:  Reviewed past medical,surgical, social and family history.  Reviewed problem list, medications and allergies. Physical Assessment:  There were no vitals filed for this visit.There is no height or weight on file to calculate BMI.   Physical Examination:  General appearance - well appearing, and in no distress Mental status - alert, oriented to person, place, and time Psych:  She has a normal mood and affect Skin - warm and dry, normal color, no suspicious lesions noted Chest - effort normal Heart - normal rate  Breasts - breasts appear normal, no suspicious masses, no skin or nipple changes or axillary nodes Abdomen - soft, nontender, nondistended, no masses or organomegaly Pelvic -  VULVA: normal appearing vulva with no masses, tenderness or lesions  VAGINA: normal appearing vagina with normal color and discharge, no lesions  CERVIX: normal appearing cervix without discharge or lesions, no CMT UTERUS: uterus is felt to be normal size, shape, consistency and nontender  ADNEXA: No adnexal masses or tenderness noted. Extremities:  No swelling or varicosities noted  Chaperone present for exam  No results found for this or any  previous visit (from the past 24 hour(s)).  Assessment & Plan:  Diagnoses and all orders for this visit:  Encounter for annual routine gynecological examination  - Cervical cancer screening: Discussed screening Q3 years. Reviewed importance of annual exams and limits of pap smear. Pap with reflex HPV done *** - GC/CT: Discussed and recommended. Pt  {Blank single:19197::"accepts","declines"} - Gardasil: {Blank single:19197::"***","has not yet had. Will provide information","completed","has not yet had. Counseling provided and she declines","Has not yet had. Counseling provided and pt accepts"} - Birth Control: Depo - Breast Health: Encouraged self breast awareness/exams. Teaching provided. - Follow-up: 12 months and prn     No orders of the defined types were placed in this encounter.   Meds: No orders of the defined types were placed in this encounter.   Follow-up: No follow-ups on file.  Radene Gunning, MD 09/22/2021 7:58 PM

## 2021-09-25 ENCOUNTER — Other Ambulatory Visit: Payer: Self-pay

## 2021-09-25 ENCOUNTER — Ambulatory Visit (INDEPENDENT_AMBULATORY_CARE_PROVIDER_SITE_OTHER): Payer: Medicaid Other | Admitting: Obstetrics and Gynecology

## 2021-09-25 ENCOUNTER — Encounter: Payer: Self-pay | Admitting: Obstetrics and Gynecology

## 2021-09-25 VITALS — BP 107/77 | HR 70 | Ht 66.0 in | Wt 134.0 lb

## 2021-09-25 DIAGNOSIS — Z3042 Encounter for surveillance of injectable contraceptive: Secondary | ICD-10-CM | POA: Diagnosis not present

## 2021-09-25 DIAGNOSIS — Z01419 Encounter for gynecological examination (general) (routine) without abnormal findings: Secondary | ICD-10-CM

## 2021-09-25 MED ORDER — MEDROXYPROGESTERONE ACETATE 150 MG/ML IM SUSP
150.0000 mg | Freq: Once | INTRAMUSCULAR | Status: AC
  Administered 2021-09-25: 150 mg via INTRAMUSCULAR

## 2021-09-25 NOTE — Addendum Note (Signed)
Addended by: Shelly Coss on: 09/25/2021 03:50 PM   Modules accepted: Orders

## 2021-10-26 ENCOUNTER — Ambulatory Visit (INDEPENDENT_AMBULATORY_CARE_PROVIDER_SITE_OTHER): Payer: Medicaid Other | Admitting: Clinical

## 2021-10-26 DIAGNOSIS — F332 Major depressive disorder, recurrent severe without psychotic features: Secondary | ICD-10-CM | POA: Diagnosis not present
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
## 2021-10-30 NOTE — Plan of Care (Signed)
  Problem: Depression CCP Problem  1  Goal: LTG: Beatriz WILL SCORE LESS THAN 10 ON THE PATIENT HEALTH QUESTIONNAIRE (PHQ-9) Outcome: Progressing Goal: STG: Reduce overall depression score by a minimum of 25% on the Patient Health Questionnaire (PHQ-9) or the Montgomery-Asberg Depression Rating Scale (MADRS) Outcome: Progressing Goal: STG: Jessice WILL PRACTICE BEHAVIORAL ACTIVATION SKILLS 3 TIMES PER WEEK FOR THE NEXT 12 WEEKS Outcome: Progressing   

## 2021-11-09 ENCOUNTER — Ambulatory Visit (INDEPENDENT_AMBULATORY_CARE_PROVIDER_SITE_OTHER): Payer: Medicaid Other | Admitting: Clinical

## 2021-11-09 DIAGNOSIS — F332 Major depressive disorder, recurrent severe without psychotic features: Secondary | ICD-10-CM

## 2021-11-09 NOTE — Progress Notes (Unsigned)
THERAPIST PROGRESS NOTE Virtual Visit via Video Note  I connected with Alice Castillo on 11/09/2021 at  9:00 AM EDT by a video enabled telemedicine application and verified that I am speaking with the correct person using two identifiers.  Location: Patient: home Provider: office   I discussed the limitations of evaluation and management by telemedicine and the availability of in person appointments. The patient expressed understanding and agreed to proceed.   Follow Up Instructions: I discussed the assessment and treatment plan with the patient. The patient was provided an opportunity to ask questions and all were answered. The patient agreed with the plan and demonstrated an understanding of the instructions.   The patient was advised to call back or seek an in-person evaluation if the symptoms worsen or if the condition fails to improve as anticipated.   Session Time: 45 minutes  Participation Level: Active  Behavioral Response: CasualAlertEuthymic  Type of Therapy: Individual Therapy  Treatment Goals addressed: client will practice behavioral activation skills 3 times per week for the next 12 weeks  ProgressTowards Goals: Not Progressing  Interventions: CBT and Supportive  Summary:  [name] is a 26 y.o. female who presents for the scheduled appointment oriented x5, appropriately dressed, and friendly.  Client denied hallucinations and delusions. Client reported on today things have been challenging since her last appointment.  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 Client reported otherwise she is having difficulty with controlling her emotions. Client reported her boyfriend and his mother have called her "dramatic and whiny". Client reported over the past week she slept separately from her boyfriend and she was sad because he did not seem to care about how his actions negatively affected her and told her  did not care and it was her fault. Client reported during childhood her parents were always fighting and not present so she did not have a positive example for how to cope with emotions. Client reported she has nobody else to talk to. Client reported she wants to be loved and a part of something. Client reported she knows the components of what makes a healthy relationship but questions herself when her thoughts and feelings are being disregarded. Evidence of progress towards goal:  client reported she can identify 1 negative thought that supports depression.   Suicidal/Homicidal: Nowithout intent/plan  Therapist Response:  Therapist began the appointment asking the client how she has been since last appointment. Therapist used CBT to engage using active listening and positive emotional support. Therapist used CBT to engage and ask her about challenges she has encountered that trigger negative thoughts about herself. Therapist used CBT to teach and discuss how to identify her values and reinforce the practice of thought stopping. Therapist used CBT ask the client to identify her progress with frequency of use with coping skills with continued practice in her daily activity.    Therapist assigned the client homework to practice thought stopping.    Plan: Return again in 4 weeks.  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  Patient/Guardian was advised Release of Information must be obtained prior to any record release in order to collaborate their care with an outside provider. Patient/Guardian was advised if they have not already done so to contact the registration department to sign all necessary forms in order for [place] to release information regarding their care.   Consent:  Patient/Guardian gives verbal consent for treatment and assignment of benefits for services provided during this visit.  Patient/Guardian expressed understanding and agreed to proceed.   [place], LCSW 11/09/2021

## 2021-11-11 NOTE — Plan of Care (Signed)
  Problem: Depression CCP Problem  1  Goal: LTG: [place] WILL SCORE LESS THAN 10 ON THE PATIENT HEALTH QUESTIONNAIRE (PHQ-9) Outcome: Not Progressing Goal: STG: Reduce overall depression score by a minimum of 25% on the Patient Health Questionnaire (PHQ-9) or the Montgomery-Asberg Depression Rating Scale (MADRS) Outcome: Not Progressing Goal: STG: [place] WILL PRACTICE BEHAVIORAL ACTIVATION SKILLS 3 TIMES PER WEEK FOR THE NEXT 12 WEEKS Outcome: Not Progressing   

## 2021-11-22 ENCOUNTER — Ambulatory Visit (INDEPENDENT_AMBULATORY_CARE_PROVIDER_SITE_OTHER): Payer: Medicaid Other

## 2021-11-22 ENCOUNTER — Other Ambulatory Visit: Payer: Self-pay

## 2021-11-22 VITALS — BP 111/77 | HR 73 | Wt 132.2 lb

## 2021-11-22 DIAGNOSIS — R350 Frequency of micturition: Secondary | ICD-10-CM

## 2021-11-22 DIAGNOSIS — R300 Dysuria: Secondary | ICD-10-CM

## 2021-11-22 MED ORDER — NITROFURANTOIN MONOHYD MACRO 100 MG PO CAPS: 100.0000 mg | ORAL_CAPSULE | Freq: Two times a day (BID) | ORAL | 0 refills | Status: DC

## 2021-11-22 MED ORDER — PHENAZOPYRIDINE HCL 200 MG PO TABS: 200.0000 mg | ORAL_TABLET | Freq: Three times a day (TID) | ORAL | 0 refills | Status: DC | PRN

## 2021-11-22 NOTE — Progress Notes (Signed)
Here today with complaint of possible UTI. Pt reports urinary frequency, mucous in urine, and intermittent burning/irritation during urine stream. States these symptoms have been present for over 1 week. Urinalysis today is very suspicious for UTI. Urine culture sent. Macrobid and Pyridium ordered per protocol. Explained to patient we will contact her with any abnormal results and/or need for additional antibiotics.   Pt reports daily migraines accompanied by blurry vision. Recommended follow up with PCP and eye doctor. Explained [place] headache specialist may be accepting new patients, but I am unsure. Will follow up with patient via MyChart to let her know if this is a possibility. Pt agreeable.   Apolonio Schneiders RN 11/22/21

## 2021-11-23 LAB — POCT URINALYSIS DIP (DEVICE)
Glucose, UA: NEGATIVE mg/dL
Ketones, ur: 15 mg/dL — AB
Nitrite: POSITIVE — AB
Protein, ur: >=300 mg/dL — AB
Specific Gravity, Urine: >=1.03 (ref 1.005–1.030)
Urobilinogen, UA: 0.2 mg/dL (ref 0.0–1.0)
pH: 5.5 (ref 5.0–8.0)

## 2021-11-24 LAB — URINE CULTURE

## 2021-12-01 ENCOUNTER — Ambulatory Visit (INDEPENDENT_AMBULATORY_CARE_PROVIDER_SITE_OTHER): Payer: Medicaid Other | Admitting: Clinical

## 2021-12-01 DIAGNOSIS — F332 Major depressive disorder, recurrent severe without psychotic features: Secondary | ICD-10-CM

## 2021-12-01 NOTE — Progress Notes (Signed)
THERAPIST PROGRESS NOTE Virtual Visit via Video Note  I connected with Alice Castillo on 12/01/2021 at  9:00 AM EDT by a video enabled telemedicine application and verified that I am speaking with the correct person using two identifiers.  Location: Patient: home Provider: office   I discussed the limitations of evaluation and management by telemedicine and the availability of in person appointments. The patient expressed understanding and agreed to proceed.  Follow Up Instructions: I discussed the assessment and treatment plan with the patient. The patient was provided an opportunity to ask questions and all were answered. The patient agreed with the plan and demonstrated an understanding of the instructions.   The patient was advised to call back or seek an in-person evaluation if the symptoms worsen or if the condition fails to improve as anticipated.   Session Time: 45 minutes  Participation Level: Active  Behavioral Response: CasualAlertDepressed  Type of Therapy: Individual Therapy  Treatment Goals addressed: Client will practice behavioral activation skills 3 times per week for the next 12 weeks  ProgressTowards Goals: Not Progressing  Interventions: CBT and Supportive  Summary:  Alice Castillo is a 26 y.o. female who presents for the scheduled appointment oriented x5, appropriately dressed, friendly.  Client denied hallucinations and delusions. Client reported she has been feeling very tired and depressed.  Client reported she has not been able to sleep at all and gets approximately a few hours of sleep per week.  Client reported negative previous psychiatric medications were helpful to her.  Client reported there was an incident that happened last week that spiraled into this week.  Client reported last week she decided to leave the house to have a drink with some friends but drove herself home.  Client reported she drinks 1-2 times per month if that. Client reported no  incident occurred but her boyfriend became very irate with her integrated care.  Client reported the way that he spoke to her and made her feel like she is not human.  Client reported he broke apart inside of her tell her she has no happiness or motivation for anything.  Client reported out of her emotions from that situation she began to research government assistance for housing.  Client reported her boyfriend found out that she was making phone calls for assistance and he became upset with her.  Client reported he now ask her what she spends her time doing since finding out.  Client reported she does not want to make any well moves because it could take away from her kids having a place to stay.  Client reported she would be interested in speaking with the psychiatrist about medications to just help her sleep. Evidence of progress towards goal:  client reported 1 positive of wanting to find additional resources to move out on her own.  Client reported 0 use of other coping skills during the week.   Suicidal/Homicidal: Nowithout intent/plan  Therapist Response:  Therapist began the appointment asking the client how she has been doing. Therapist used CBT to engage using active listening and positive emotional support. Therapist used CBT to engage and allow the client to discuss recent triggering situations that have triggered her depression and anxiety. Therapist used CBT to normalize the clients emotional response and how she would like to see herself improve from the stressor. Therapist used CBT ask the client to identify her progress with frequency of use with coping skills with continued practice in her daily activity.    Therapist assigned  the client homework to practice self care.  Plan: Return again in 3 weeks.  Diagnosis: Severe episode of recurrent major depressive disorder, without psychotic features  Collaboration of Care: Other client reported she would like information about housing  resources.  Patient/Guardian was advised Release of Information must be obtained prior to any record release in order to collaborate their care with an outside provider. Patient/Guardian was advised if they have not already done so to contact the registration department to sign all necessary forms in order for [place] to release information regarding their care.   Consent: Patient/Guardian gives verbal consent for treatment and assignment of benefits for services provided during this visit. Patient/Guardian expressed understanding and agreed to proceed.   [place], LCSW 12/01/2021

## 2021-12-02 NOTE — Plan of Care (Signed)
  Problem: Depression CCP Problem  1  Goal: LTG: [place] WILL SCORE LESS THAN 10 ON THE PATIENT HEALTH QUESTIONNAIRE (PHQ-9) Outcome: Not Progressing Goal: STG: Reduce overall depression score by a minimum of 25% on the Patient Health Questionnaire (PHQ-9) or the Montgomery-Asberg Depression Rating Scale (MADRS) Outcome: Not Progressing Goal: STG: [place] WILL PRACTICE BEHAVIORAL ACTIVATION SKILLS 3 TIMES PER WEEK FOR THE NEXT 12 WEEKS Outcome: Not Progressing   

## 2021-12-14 ENCOUNTER — Ambulatory Visit (INDEPENDENT_AMBULATORY_CARE_PROVIDER_SITE_OTHER): Payer: Medicaid Other | Admitting: Clinical

## 2021-12-14 ENCOUNTER — Telehealth (HOSPITAL_COMMUNITY): Payer: Medicaid Other | Admitting: Psychiatry

## 2021-12-14 DIAGNOSIS — F332 Major depressive disorder, recurrent severe without psychotic features: Secondary | ICD-10-CM

## 2021-12-14 NOTE — Progress Notes (Signed)
THERAPIST PROGRESS NOTE Virtual Visit via Video Note  I connected with Alice Castillo on 12/14/2021 at 11:00 AM EDT by a video enabled telemedicine application and verified that I am speaking with the correct person using two identifiers.  Location: Patient: home Provider: office   I discussed the limitations of evaluation and management by telemedicine and the availability of in person appointments. The patient expressed understanding and agreed to proceed.  Follow Up Instructions: I discussed the assessment and treatment plan with the patient. The patient was provided an opportunity to ask questions and all were answered. The patient agreed with the plan and demonstrated an understanding of the instructions.   The patient was advised to call back or seek an in-person evaluation if the symptoms worsen or if the condition fails to improve as anticipated.   Session Time: 45 minutes  Participation Level: Active  Behavioral Response: CasualAlertDepressed  Type of Therapy: Individual Therapy  Treatment Goals addressed: client will practice behavioral activation skills 3 times per week for the next 12 weeks  ProgressTowards Goals: Progressing  Interventions: CBT  Summary:  Alice Castillo is a 26 y.o. female who presents for the scheduled appointment oriented times five, appropriately dressed, and friendly. Client denied hallucinations and delusions. Client reported her weeks have been up and down since the last appointment. Client reported her boyfriend has good weeks when he communicates and bad weeks. Client reported for example her boyfriends family had a child's birthday party to attend for his nephew. Client reported his family drinks and they offered her a drink and she declined because she was driving. Client on this recent event she was not driving and respectfully asked him beforehand if he would be okay for her to have a drink at the gathering. Client reported her went on a  tangent saying he doesn't want her to drink around his family but he will drink and get high and it's not an issue. Client reported overall she feels like she can never make him happy. Client reported she still has thoughts about wanting to leave him. 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 Client reported no help watching the kids during the week. Client reported she is interested in working from home but will have to get additional certifications.  Evidence of progress towards goal: Client reported she has come up with 1 goal figuring out a certification or school program that she can attend to help further her career and help her to be financially independent.  Suicidal/Homicidal: Nowithout intent/plan  Therapist Response:  Therapist began the appointment asking client how she has been doing since last seen. Therapist used CBT to engage using active listening and positive emotional support. Therapist used CBT to give the client time to discuss the challenge in her relationship that contributes to her depression. Therapist used CBT to discuss normalizing her emotions and reframing negative thoughts about herself. Therapist used CBT to reinforce the clients motivation to explore education opportunities. Therapist used CBT ask the client to identify her progress with frequency of use with coping skills with continued practice in her daily activity.    Therapist assigned the client homework to practice self care and research education programs for professional certificate.    Plan: Return again in 4 weeks.  Diagnosis: severe episode of recurrent major depressive disorder  Collaboration of Care: Patient refused AEB no other needs requested by the client.  Patient/Guardian was  advised Release of Information must be obtained prior to any record release in order to collaborate their care with an outside provider.  Patient/Guardian was advised if they have not already done so to contact the registration department to sign all necessary forms in order for [place] to release information regarding their care.   Consent: Patient/Guardian gives verbal consent for treatment and assignment of benefits for services provided during this visit. Patient/Guardian expressed understanding and agreed to proceed.   [place], LCSW 12/14/2021

## 2021-12-16 NOTE — Plan of Care (Signed)
  Problem: Depression CCP Problem  1  Goal: STG: Alice Castillo WILL PRACTICE BEHAVIORAL ACTIVATION SKILLS 3 TIMES PER WEEK FOR THE NEXT 12 WEEKS Outcome: Progressing   Problem: Depression CCP Problem  1  Goal: LTG: Alice Castillo WILL SCORE LESS THAN 10 ON THE PATIENT HEALTH QUESTIONNAIRE (PHQ-9) Outcome: Not Progressing Goal: STG: Reduce overall depression score by a minimum of 25% on the Patient Health Questionnaire (PHQ-9) or the Montgomery-Asberg Depression Rating Scale (MADRS) Outcome: Not Progressing

## 2021-12-18 ENCOUNTER — Ambulatory Visit: Payer: Medicaid Other

## 2021-12-18 ENCOUNTER — Other Ambulatory Visit: Payer: Self-pay

## 2021-12-26 ENCOUNTER — Ambulatory Visit (INDEPENDENT_AMBULATORY_CARE_PROVIDER_SITE_OTHER): Payer: Medicaid Other | Admitting: Family Medicine

## 2021-12-26 ENCOUNTER — Other Ambulatory Visit: Payer: Self-pay

## 2021-12-26 ENCOUNTER — Encounter: Payer: Self-pay | Admitting: Family Medicine

## 2021-12-26 VITALS — BP 118/66 | HR 68 | Wt 142.4 lb

## 2021-12-26 DIAGNOSIS — Z3009 Encounter for other general counseling and advice on contraception: Secondary | ICD-10-CM

## 2021-12-26 DIAGNOSIS — Z1331 Encounter for screening for depression: Secondary | ICD-10-CM

## 2021-12-26 LAB — POCT PREGNANCY, URINE: Preg Test, Ur: NEGATIVE

## 2021-12-26 MED ORDER — LORAZEPAM 1 MG PO TABS: 1.0000 mg | ORAL_TABLET | Freq: Once | ORAL | 0 refills | Status: DC | PRN

## 2021-12-26 MED ORDER — IBUPROFEN 800 MG PO TABS: 800.0000 mg | ORAL_TABLET | Freq: Three times a day (TID) | ORAL | 0 refills | Status: AC | PRN

## 2021-12-26 NOTE — Progress Notes (Signed)
GYNECOLOGY OFFICE VISIT NOTE  History:   Alice Castillo is a 26 y.o. [phone] here today for contraception counseling.  Patient notes she has been on the Depo-Provera birth control for 3 years and has had a lot of "hormonal" issues.  She would like a nonhormonal birth control option.  In addition, she passes out every single time she has any medical procedure, particularly an interaction with needles.  She is desiring a longer acting birth control option that does not require needles.  She denies any abnormal vaginal discharge, bleeding, pelvic pain or other concerns.    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[place])    Headache    Supervision of high risk pregnancy, antepartum 04/13/2019    Nursing Staff Provider Office Location [facility] Dating  LMP c/w 30wk [place] Language  english Anatomy US  Normal Flu Vaccine  declined Genetic Screen  NIPS:   AFP:   First Screen:  Quad:   TDaP vaccine  04/13/2019 Hgb A1C or  GTT 2hr GTT normal  Rhogam  04/06/2019   LAB RESULTS  Feeding Plan Breast Blood Type O/Negative/-- (02/15 1414)  Contraception Depo Antibody Positive, See Final Results (02/15 1414    Past Surgical History:  Procedure Laterality Date   NO PAST SURGERIES      The following portions of the patient's history were reviewed and updated as appropriate: allergies, current medications, past family history, past medical history, past social history, past surgical history and problem list.   Health Maintenance:  Normal pap and negative HRHPV on 08/11/2019.  Normal mammogram on N/A.   Review of Systems:  Pertinent items noted in HPI and remainder of comprehensive ROS otherwise negative.  Physical Exam:  BP 118/66   Pulse 68   Wt 142 lb 6.4 oz (64.6 kg)   BMI 22.98 kg/m  CONSTITUTIONAL: Well-developed, well-nourished female in no acute distress.  HEENT:  Normocephalic, atraumatic. External right  and left ear normal. No scleral icterus.  NECK: Normal range of motion, supple, no masses noted on observation SKIN: No rash noted. Not diaphoretic. No erythema. No pallor. MUSCULOSKELETAL: Normal range of motion. No edema noted. NEUROLOGIC: Alert and oriented to person, place, and time. Normal muscle tone coordination.  PSYCHIATRIC: Normal mood and affect. Normal behavior. Normal judgment and thought content. CARDIOVASCULAR: Normal heart rate noted RESPIRATORY: Effort and breath sounds normal, no problems with respiration noted ABDOMEN: No masses noted. No other overt distention noted.   PELVIC: Deferred  Labs and Imaging Results for orders placed or performed in visit on 12/26/21 (from the past 168 hour(s))  Pregnancy, urine POC   Collection Time: 12/26/21  2:53 PM  Result Value Ref Range   Preg Test, Ur NEGATIVE NEGATIVE   No results found.    Assessment and Plan:      1. Birth control counseling - LORazepam (ATIVAN) 1 MG tablet; Take 1 tablet (1 mg total) by mouth Once PRN for up to 1 dose for anxiety (prior to IUD insertion).  Dispense: 1 tablet; Refill: 0 - ibuprofen (ADVIL) 800 MG tablet; Take 1 tablet (800 mg total) by mouth every 8 (eight) hours as needed for up to 5 days for moderate pain.  Dispense: 15 tablet; Refill: 0  2. Positive depression screening - Ambulatory referral to [facility] - LORazepam (ATIVAN) 1 MG tablet; Take 1 tablet (1 mg  total) by mouth Once PRN for up to 1 dose for anxiety (prior to IUD insertion).  Dispense: 1 tablet; Refill: 0 - ibuprofen (ADVIL) 800 MG tablet; Take 1 tablet (800 mg total) by mouth every 8 (eight) hours as needed for up to 5 days for moderate pain.  Dispense: 15 tablet; Refill: 0    Pt her to discuss BC. Desired paraguard IUD.  Patient has a lot of anxiety around medical procedures, therefore discussed Ativan before procedure and ibuprofen before and after procedure.  She will call to schedule this visit when  somebody else can drive her.  Routine preventative health maintenance measures emphasized. Please refer to After Visit Summary for other counseling recommendations.   Return in about 1 week (around 01/02/2022) for Will call for Praguard IUD insertion procedure.     Shelda Pal, DO OB Fellow, [facility] 12/26/2021 3:13 PM

## 2021-12-29 ENCOUNTER — Encounter: Payer: Self-pay | Admitting: Physician Assistant

## 2021-12-29 ENCOUNTER — Ambulatory Visit (INDEPENDENT_AMBULATORY_CARE_PROVIDER_SITE_OTHER): Payer: Medicaid Other | Admitting: Clinical

## 2021-12-29 ENCOUNTER — Ambulatory Visit (INDEPENDENT_AMBULATORY_CARE_PROVIDER_SITE_OTHER): Payer: Medicaid Other | Admitting: Physician Assistant

## 2021-12-29 VITALS — BP 116/79 | HR 80 | Ht 66.0 in | Wt 141.0 lb

## 2021-12-29 DIAGNOSIS — G4440 Drug-induced headache, not elsewhere classified, not intractable: Secondary | ICD-10-CM | POA: Diagnosis not present

## 2021-12-29 DIAGNOSIS — F332 Major depressive disorder, recurrent severe without psychotic features: Secondary | ICD-10-CM | POA: Diagnosis not present

## 2021-12-29 DIAGNOSIS — G43009 Migraine without aura, not intractable, without status migrainosus: Secondary | ICD-10-CM | POA: Diagnosis not present

## 2021-12-29 MED ORDER — CYCLOBENZAPRINE HCL 10 MG PO TABS: 10.0000 mg | ORAL_TABLET | Freq: Three times a day (TID) | ORAL | 1 refills | Status: DC | PRN

## 2021-12-29 MED ORDER — TOPIRAMATE 25 MG PO TABS: 75.0000 mg | ORAL_TABLET | Freq: Every day | ORAL | 2 refills | Status: DC

## 2021-12-29 MED ORDER — SUMATRIPTAN SUCCINATE 100 MG PO TABS: 100.0000 mg | ORAL_TABLET | Freq: Once | ORAL | 11 refills | Status: DC | PRN

## 2021-12-29 NOTE — Progress Notes (Signed)
THERAPIST PROGRESS NOTE Virtual Visit via Video Note  I connected with Alice Castillo on 12/29/21 at 11:00 AM EDT by a video enabled telemedicine application and verified that I am speaking with the correct person using two identifiers.  Location: Patient: work Provider: office   I discussed the limitations of evaluation and management by telemedicine and the availability of in person appointments. The patient expressed understanding and agreed to proceed.  Follow Up Instructions: I discussed the assessment and treatment plan with the patient. The patient was provided an opportunity to ask questions and all were answered. The patient agreed with the plan and demonstrated an understanding of the instructions.   The patient was advised to call back or seek an in-person evaluation if the symptoms worsen or if the condition fails to improve as anticipated.   Session Time: 25 minutes  Participation Level: Active  Behavioral Response: CasualAlertAnxious  Type of Therapy: Individual Therapy  Treatment Goals addressed: Client will practice behavioral activation skills 3 times per week for the next 12 weeks  ProgressTowards Goals: Not Progressing  Interventions: CBT and Supportive  Summary:  Alice Castillo is a 26 y.o. female who presents for the scheduled appointment oriented x5, appropriately dressed, and friendly.  Client denied hallucinations and delusions. Client reported on today she is doing fairly well.  Client reported she is able to get the extra work shift this morning.  Client reported since she was last seen she had a appointment with the headache specialist about her headaches/migraines.  Client reported she described the severity of her migraines which can include loss of vision.  Client reported the doctor told her it may have been possible that she had a stroke and should have sought attention at the emergency room.  Client reported hearing that made her nervous and she did  not ask the doctor any follow-up questions but she was prescribed her medications.  Client reported she does have a family history of heart problems that began at a young age on her mother side of the family and she did not disclose that family history to her doctor.  Client reported her boyfriend has been supportive during her health concerns.  Client reported she does need to work on her irritability.  Client reported sometimes he gets projected onto the kids feeling irritated by things that they do which is out of proportion to what they are doing. Evidence of progress towards goal: Client reported she has not been able to use 0 coping skills to help with her irritability within the past 2 weeks.  Suicidal/Homicidal: Nowithout intent/plan  Therapist Response:  Therapist began the appointment asking the client how she has been doing since last seen. Therapist used CBT to engage using active listening and positive emotional support. Therapist used CBT To ask client open-ended questions about recent sources and severity of her anxiety and depressive symptoms. Therapist used CBT to give the client time to discuss her concerns related to health and how to appropriately regulate her emotions when she feels triggered. Therapist used CBT to discuss the benefit of being intentional about daily routine with her ability to leave the house is limited. Therapist used CBT ask the client to identify her progress with frequency of use with coping skills with continued practice in her daily activity.    Therapist assigned the client homework to follow up with her PCP about her concerns and to have a daily routine to help her start her day positive.   Plan: Return again  in 1 weeks.  Diagnosis: Severe episode of recurrent major depressive disorder, without psychotic features  Collaboration of Care: Patient refused AEB none requested by the client.  Patient/Guardian was advised Release of Information must be  obtained prior to any record release in order to collaborate their care with an outside provider. Patient/Guardian was advised if they have not already done so to contact the registration department to sign all necessary forms in order for [place] to release information regarding their care.   Consent: Patient/Guardian gives verbal consent for treatment and assignment of benefits for services provided during this visit. Patient/Guardian expressed understanding and agreed to proceed.   [place], LCSW 12/29/2021

## 2021-12-29 NOTE — Progress Notes (Signed)
Patient here for HA consult.  Notes has been dealing with HA's for years Takes Excedrin but it is no longer helping. Pt recently had episode where see lost vision 1-2 wks ago.  Notes blurry vision with headaches.    History:  Alice Castillo is a 26 y.o. [id number] who presents to clinic today for evaluation of headache.  She reports she has not seen provider for headaches previously.  She is using excedrin daily and has also tried tylenol. She has tried multiple medications for anxiety/depression but notes they make her feel badly.  The headaches get to be severe, beginning bilaterally around her eyes and spreading outward.  It feels like a pressure and there is throbbing/pulsating.  Movement makes it worse, lights and noise make it worse.  She has not noticed smells making it worse.  There is some nausea but no vomiting.  The last severe HA resulted in complete vision loss for hours, noting that she was also in the dark.  She was hurting so badly she was crying and then she noticed the loss of vision.  The headaches last 8-12 hours and are better with sleep.  Epsom salt in bath or shower can help.   Crying (from her kids), overstimulation, stress triggers HA.  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[place])    Headache    Supervision of high risk pregnancy, antepartum 04/13/2019    Nursing Staff Provider Office Location [place] Dating  LMP c/w 30wk [place] Language  english Anatomy US  Normal Flu Vaccine  declined Genetic Screen  NIPS:   AFP:   First Screen:  Quad:   TDaP vaccine  04/13/2019 Hgb A1C or  GTT 2hr GTT normal  Rhogam  04/06/2019   LAB RESULTS  Feeding Plan Breast Blood Type O/Negative/-- (02/15 1414)  Contraception Depo Antibody Positive, See Final Results (02/15 1414     Social History   Socioeconomic History   Marital status: Single    Spouse name: Not on file   Number of children: Not on file   Years of education: Not on file   Highest education level: Not on file  Occupational History   Not on file  Tobacco Use   Smoking status: Never    Passive exposure: Yes   Smokeless tobacco: Never  Vaping Use   Vaping Use: Never used  Substance and Sexual Activity   Alcohol use: Not Currently    Alcohol/week: 1.0 standard drink of alcohol    Types: 1 Shots of liquor per week    Comment: stopped when found out pregnant   Drug use: No   Sexual activity: Not Currently    Birth control/protection: None  Other Topics Concern   Not on file  Social History Narrative   Not on file   Social Determinants of Health   Financial Resource Strain: Not on file  Food Insecurity: No Food Insecurity (09/25/2021)   Hunger Vital Sign    Worried About Running Out of Food in the Last Year: Never true    Ran Out of Food in the Last Year: Never true  Transportation Needs: No Transportation Needs (09/25/2021)   PRAPARE - Hydrologist (Medical): No  Lack of Transportation (Non-Medical): No  Physical Activity: Not on file  Stress: Not on file  Social Connections: Not on file  Intimate Partner Violence: Not At Risk (04/13/2019)   Humiliation, Afraid, Rape, and Kick questionnaire    Fear of Current or Ex-Partner: No    Emotionally Abused: No    Physically Abused: No    Sexually Abused: No    Family History  Problem Relation Age of Onset   Clotting disorder Father        Factor XI deficiency (affected or carrier?)   Heart disease Maternal Uncle    Diabetes Maternal Uncle    Stroke Maternal Uncle    Heart disease Paternal Uncle    Diabetes Paternal Uncle    Clotting disorder Paternal Uncle        Factor XI (52) deficiency   Clotting disorder Paternal Uncle        Factor XI deficiency   Diabetes Maternal Grandmother    Heart  disease Maternal Grandmother    Stroke Maternal Grandmother    Diabetes Maternal Grandfather    Heart disease Maternal Grandfather    Stroke Maternal Grandfather     Allergies  Allergen Reactions   Lactose Intolerance (Gi) Other (See Comments)    Stomach irritation, and diarhea    Current Outpatient Medications on File Prior to Visit  Medication Sig Dispense Refill   aspirin-acetaminophen-caffeine (EXCEDRIN MIGRAINE) 250-250-65 MG tablet Take 2 tablets by mouth every 6 (six) hours as needed for headache.     ibuprofen (ADVIL) 800 MG tablet Take 1 tablet (800 mg total) by mouth every 8 (eight) hours as needed for up to 5 days for moderate pain. (Patient not taking: Reported on 12/29/2021) 15 tablet 0   LORazepam (ATIVAN) 1 MG tablet Take 1 tablet (1 mg total) by mouth Once PRN for up to 1 dose for anxiety (prior to IUD insertion). (Patient not taking: Reported on 12/29/2021) 1 tablet 0   medroxyPROGESTERone (DEPO-PROVERA) 150 MG/ML injection Inject 150 mg into the muscle every 3 (three) months. (Patient not taking: Reported on 12/29/2021)     No current facility-administered medications on file prior to visit.     Review of Systems:  All pertinent positive/negative included in HPI, all other review of systems are negative   Objective:  Physical Exam BP 116/79   Pulse 80   Ht '5\' 6"'$  (1.676 m)   Wt 141 lb (64 kg)   BMI 22.76 kg/m  CONSTITUTIONAL: Well-developed, well-nourished female in no acute distress.  EYES: EOM intact ENT: Normocephalic CARDIOVASCULAR: Regular rate  RESPIRATORY: Normal rate.  MUSCULOSKELETAL: Normal ROM SKIN: Warm, dry without erythema  NEUROLOGICAL: Alert, oriented, CN II-XII grossly intact, Appropriate balance  PSYCH: Normal behavior, mood   Assessment & Plan:  Assessment: 1. Migraine without aura and without status migrainosus, not intractable   2. Medication overuse headache      Plan: Topamax '25mg'$  daily and titrate up to '75mg'$  as tolerated for  prevention of migraine Sumatriptan for acute migraine.  May repeat after 2 hours.  Max of 2/day, 2 days per week.   Cyclobenzaprine - use 1/2 to 1 tab up to 3 times per day for headache/muscle spasm - use in evening regularly initially to reduce baseline muscle spasm.  Sedation precautions discussed. D/C Excedrin, Caffeine, Tylenol, etc Go to ED for evaluation if new, severe symptoms begin.   Follow-up in 3 months or sooner PRN  46 minutes of face to face time spent with patient.  Paticia Stack, PA-C 12/29/2021 9:09 AM

## 2021-12-29 NOTE — Patient Instructions (Signed)
Analgesic Rebound Headache An analgesic rebound headache, sometimes called a medication overuse headache or a drug-induced headache, is a secondary disorder that is caused by the overuse of pain medicine (analgesic) to treat the original (primary) headache. Any type of primary headache can return as a rebound headache if a person regularly takes analgesics. The types of primary headaches that are commonly associated with rebound headaches include: Migraines. Headaches that are caused by tense muscles in the head and neck area (tension headaches). Headaches that develop and happen again on one side of the head and around the eye (cluster headaches). If rebound headaches continue, they can become long-term, daily headaches. What are the causes? This condition may be caused by frequent use of: Over-the-counter medicines such as aspirin, ibuprofen, and acetaminophen. Sinus-relief medicines and medicines that contain caffeine. Narcotic pain medicines such as codeine and oxycodone. Some prescription migraine medicines. What are the signs or symptoms? The symptoms of a rebound headache are the same as the symptoms of the original headache. Some of the symptoms of specific types of headaches include: Migraine headache Pulsing or throbbing pain on one or both sides of the head. Severe pain that interferes with daily activities. Pain that gets worse with physical activity. Nausea, vomiting, or both. Pain and sensitivity with exposure to bright light, loud noises, or strong smells. Visual changes. Numbness of one or both arms. Tension headache Pressure around the head. Dull, aching head pain. Pain felt over the front and sides of the head. Tenderness in the muscles of the head, neck, and shoulders. Cluster headache Severe pain that begins in or around one eye or temple. Droopy or swollen eyelid, or redness and tearing in the eye on the same side as the pain. One-sided head pain. Nausea. Runny  nose. Sweaty, pale facial skin. Restlessness. How is this diagnosed? This condition is diagnosed by: Reviewing your medical history. This includes the nature of your primary headaches. Reviewing the types of pain medicines that you have been using to treat your primary headaches and how often you take them. How is this treated? This condition may be treated or managed by: Discontinuing frequent use of the analgesic medicine. Doing this may worsen your headaches at first, but the pain should eventually become more manageable, less frequent, and less severe. Seeing a headache specialist. He or she may be able to help you manage your headaches and help make sure there is not another cause of the headaches. Using methods of stress relief, such as acupuncture, counseling, biofeedback, and massage. Talk with your health care provider about which methods might be good for you. Follow these instructions at home: Medicines  Take over-the-counter and prescription medicines only as told by your health care provider. Stop the repeated use of pain medicine as told by your health care provider. Stopping can be difficult. Carefully follow instructions from your health care provider. Lifestyle Follow a regular sleep schedule. Do not vary the time that you go to bed or the amount that you sleep from day to day. It is important to stay on the same schedule to help prevent headaches. Get 7-9 hours of sleep each night, or the amount recommended by your health care provider. Exercise regularly. Exercise for at least 30 minutes, 5 times each week. Limit or manage stress. Consider stress-relief options such as acupuncture, counseling, biofeedback, and massage. Talk with your health care provider about which methods might be good for you. Do not drink alcohol. Do not use any products that contain nicotine or  tobacco, such as cigarettes, e-cigarettes, and chewing tobacco. If you need help quitting, ask your health care  provider. General instructions Avoid triggers that are known to cause your primary headaches. Keep all follow-up visits as told by your health care provider. This is important. Contact a health care provider if: You continue to experience headaches after following treatments that your health care provider recommended. Get help right away if you have: New headache pain. Headache pain that is different than what you have experienced in the past. Numbness or tingling in your arms or legs. Changes in your speech or vision. Summary An analgesic rebound headache, sometimes called a medication overuse headache or a drug-induced headache, is caused by the overuse of pain medicine (analgesic) to treat the original (primary) headache. Any type of primary headache can return as a rebound headache if a person regularly takes analgesics. The types of primary headaches that are commonly associated with rebound headaches include migraines, tension headaches, and cluster headaches. Analgesic rebound headaches can occur with frequent use of over-the-counter medicines and prescription medicines. Treatment involves stopping the medicine that is being overused. This will improve headache frequency and severity. This information is not intended to replace advice given to you by your health care provider. Make sure you discuss any questions you have with your health care provider. Document Revised: 07/27/2021 Document Reviewed: 03/12/2019 Elsevier Patient Education  [place] Headache A migraine headache is a very strong throbbing pain on one side or both sides of your head. This type of headache can also cause other symptoms. It can last from 4 hours to 3 days. Talk with your doctor about what things may bring on (trigger) this condition. What are the causes? The exact cause of this condition is not known. This condition may be triggered or caused by: Drinking alcohol. Smoking. Taking  medicines, such as: Medicine used to treat chest pain (nitroglycerin). Birth control pills. Estrogen. Some blood pressure medicines. Eating or drinking certain products. Doing physical activity. Other things that may trigger a migraine headache include: Having a menstrual period. Pregnancy. Hunger. Stress. Not getting enough sleep or getting too much sleep. Weather changes. Tiredness (fatigue). What increases the risk? Being 51-52 years old. Being female. Having a family history of migraine headaches. Being Caucasian. Having depression or anxiety. Being very overweight. What are the signs or symptoms? A throbbing pain. This pain may: Happen in any area of the head, such as on one side or both sides. Make it hard to do daily activities. Get worse with physical activity. Get worse around bright lights or loud noises. Other symptoms may include: Feeling sick to your stomach (nauseous). Vomiting. Dizziness. Being sensitive to bright lights, loud noises, or smells. Before you get a migraine headache, you may get warning signs (an aura). An aura may include: Seeing flashing lights or having blind spots. Seeing bright spots, halos, or zigzag lines. Having tunnel vision or blurred vision. Having numbness or a tingling feeling. Having trouble talking. Having weak muscles. Some people have symptoms after a migraine headache (postdromal phase), such as: Tiredness. Trouble thinking (concentrating). How is this treated? Taking medicines that: Relieve pain. Relieve the feeling of being sick to your stomach. Prevent migraine headaches. Treatment may also include: Having acupuncture. Avoiding foods that bring on migraine headaches. Learning ways to control your body functions (biofeedback). Therapy to help you know and deal with negative thoughts (cognitive behavioral therapy). Follow these instructions at home: Medicines Take over-the-counter and prescription medicines only  as  told by your doctor. Ask your doctor if the medicine prescribed to you: Requires you to avoid driving or using heavy machinery. Can cause trouble pooping (constipation). You may need to take these steps to prevent or treat trouble pooping: Drink enough fluid to keep your pee (urine) pale yellow. Take over-the-counter or prescription medicines. Eat foods that are high in fiber. These include beans, whole grains, and fresh fruits and vegetables. Limit foods that are high in fat and sugar. These include fried or sweet foods. Lifestyle Do not drink alcohol. Do not use any products that contain nicotine or tobacco, such as cigarettes, e-cigarettes, and chewing tobacco. If you need help quitting, ask your doctor. Get at least 8 hours of sleep every night. Limit and deal with stress. General instructions Keep a journal to find out what may bring on your migraine headaches. For example, write down: What you eat and drink. How much sleep you get. Any change in what you eat or drink. Any change in your medicines. If you have a migraine headache: Avoid things that make your symptoms worse, such as bright lights. It may help to lie down in a dark, quiet room. Do not drive or use heavy machinery. Ask your doctor what activities are safe for you. Keep all follow-up visits as told by your doctor. This is important. Contact a doctor if: You get a migraine headache that is different or worse than others you have had. You have more than 15 headache days in one month. Get help right away if: Your migraine headache gets very bad. Your migraine headache lasts longer than 72 hours. You have a fever. You have a stiff neck. You have trouble seeing. Your muscles feel weak or like you cannot control them. You start to lose your balance a lot. You start to have trouble walking. You pass out (faint). You have a seizure. Summary A migraine headache is a very strong throbbing pain on one side or both sides  of your head. These headaches can also cause other symptoms. This condition may be treated with medicines and changes to your lifestyle. Keep a journal to find out what may bring on your migraine headaches. Contact a doctor if you get a migraine headache that is different or worse than others you have had. Contact your doctor if you have more than 15 headache days in a month. This information is not intended to replace advice given to you by your health care provider. Make sure you discuss any questions you have with your health care provider. Document Revised: 07/27/2021 Document Reviewed: 03/27/2018 Elsevier Patient Education  [place].

## 2021-12-29 NOTE — Plan of Care (Signed)
  Problem: Depression CCP Problem  1  Goal: LTG: Alice Castillo WILL SCORE LESS THAN 10 ON THE PATIENT HEALTH QUESTIONNAIRE (PHQ-9) Outcome: Not Progressing Goal: STG: Reduce overall depression score by a minimum of 25% on the Patient Health Questionnaire (PHQ-9) or the [place]-Asberg Depression Rating Scale (MADRS) Outcome: Not Progressing Goal: STG: Alice Castillo WILL PRACTICE BEHAVIORAL ACTIVATION SKILLS 3 TIMES PER WEEK FOR THE NEXT 12 WEEKS Outcome: Not Progressing

## 2022-01-04 ENCOUNTER — Ambulatory Visit: Payer: Medicaid Other | Admitting: Student

## 2022-01-05 ENCOUNTER — Ambulatory Visit (INDEPENDENT_AMBULATORY_CARE_PROVIDER_SITE_OTHER): Payer: Medicaid Other | Admitting: Clinical

## 2022-01-05 DIAGNOSIS — F332 Major depressive disorder, recurrent severe without psychotic features: Secondary | ICD-10-CM

## 2022-01-05 NOTE — Progress Notes (Signed)
THERAPIST PROGRESS NOTE Virtual Visit via Video Note  I connected with Alice Castillo on 01/05/22 at 10:00 AM EST by a video enabled telemedicine application and verified that I am speaking with the correct person using two identifiers.  Location: Patient: home Provider: office   I discussed the limitations of evaluation and management by telemedicine and the availability of in person appointments. The patient expressed understanding and agreed to proceed.   Follow Up Instructions: I discussed the assessment and treatment plan with the patient. The patient was provided an opportunity to ask questions and all were answered. The patient agreed with the plan and demonstrated an understanding of the instructions.   The patient was advised to call back or seek an in-person evaluation if the symptoms worsen or if the condition fails to improve as anticipated.   Session Time: 35 minutes  Participation Level: Active  Behavioral Response: CasualAlertDepressed  Type of Therapy: Individual Therapy  Treatment Goals addressed: client will practice behavioral activation skills 3 times per week for the next 12 weeks  ProgressTowards Goals: Progressing  Interventions: CBT and Supportive  Summary:  [name] is a 26 y.o. female who presents for scheduled appointment oriented x5, appropriately dressed, and friendly.  Client denies hallucinations and delusions. Client reported today she is doing fairly well.  Client reported since her last appointment she has started the medications from her medical doctor as it relates to her migraines.  Client reported so far she seems to be adjusting well to medications but we will see how it continues to help her.  Client reported her partner tries to be helpful to her if she is dealing with her migraines.  Client reported regardless she does not get that much help from him because he sleeps during the day and leaves all night from work. Client reported she  tried the suggestion of creating a morning routine with the boys to help start the day in a positive way. Client reported they did okay for a few days but then she stopped because they were all over the place. Client reported she can be quick to give up on something when it doesn't work. Client reported she has been having random thoughts about the past and doesn't know why. Client reported it makes her feel sad because she has tried to so hard to move past it. Client reported she doesn't understand because her kids are healthy and they have a roof over their head. Evidence of progress towards goal:  client reported trying a morning routine 2 out of 7 days per week.   Suicidal/Homicidal: Nowithout intent/plan  Therapist Response:  Therapist began the appointment asking the client how she has been doing. Therapist used CBT to engage using active listening and positive emotional support. Therapist used CBT to ask the client how she is adjusting to changes with her health. Therapist used CBT to engage and give the client time to discuss her thoughts related to past traumatic events and how it causes her to think about herself. Therapist used CBT to normalize the clients emotional response and discuss identifying underlying negative cognitives related to memories. Therapist used CBT ask the client to identify her progress with frequency of use with coping skills with continued practice in her daily activity.    Therapist assigned the client homework to continue with slowly increasing days for the morning routine and practicing self care    Plan: Return again in 3 weeks.  Diagnosis: severe episode of recurrent major depressive disorder  Collaboration of Care: Patient refused AEB none requested by the client at this time.  Patient/Guardian was advised Release of Information must be obtained prior to any record release in order to collaborate their care with an outside provider. Patient/Guardian was  advised if they have not already done so to contact the registration department to sign all necessary forms in order for [place] to release information regarding their care.   Consent: Patient/Guardian gives verbal consent for treatment and assignment of benefits for services provided during this visit. Patient/Guardian expressed understanding and agreed to proceed.   [place], LCSW 01/05/2022

## 2022-01-05 NOTE — Plan of Care (Signed)
  Problem: Depression CCP Problem  1  Goal: LTG: Alice Castillo WILL SCORE LESS THAN 10 ON THE PATIENT HEALTH QUESTIONNAIRE (PHQ-9) Outcome: Progressing Goal: STG: Reduce overall depression score by a minimum of 25% on the Patient Health Questionnaire (PHQ-9) or the Montgomery-Asberg Depression Rating Scale (MADRS) Outcome: Progressing Goal: STG: Alice Castillo WILL PRACTICE BEHAVIORAL ACTIVATION SKILLS 3 TIMES PER WEEK FOR THE NEXT 12 WEEKS Outcome: Progressing

## 2022-01-10 ENCOUNTER — Other Ambulatory Visit: Payer: Self-pay

## 2022-01-10 ENCOUNTER — Encounter: Payer: Self-pay | Admitting: Obstetrics and Gynecology

## 2022-01-10 ENCOUNTER — Ambulatory Visit (INDEPENDENT_AMBULATORY_CARE_PROVIDER_SITE_OTHER): Payer: Medicaid Other | Admitting: Obstetrics and Gynecology

## 2022-01-10 VITALS — BP 108/79 | HR 87 | Ht 66.0 in | Wt 140.8 lb

## 2022-01-10 DIAGNOSIS — Z3202 Encounter for pregnancy test, result negative: Secondary | ICD-10-CM | POA: Diagnosis not present

## 2022-01-10 DIAGNOSIS — Z30430 Encounter for insertion of intrauterine contraceptive device: Secondary | ICD-10-CM

## 2022-01-10 LAB — POCT PREGNANCY, URINE: Preg Test, Ur: NEGATIVE

## 2022-01-10 MED ORDER — PARAGARD INTRAUTERINE COPPER IU IUD
1.0000 | INTRAUTERINE_SYSTEM | Freq: Once | INTRAUTERINE | Status: AC
  Administered 2022-01-10: 1 via INTRAUTERINE

## 2022-01-10 NOTE — Progress Notes (Signed)
GYNECOLOGY VISIT  Patient name: Dalisa Forrer MRN [id number]  Date of birth: 1995/03/23 Chief Complaint:   Procedure  History:  Kalii Chesmore is a 26 y.o. [id number] being seen today for IUD insertion. Previously discussed various LARC options and will d/c depo injection and proceed with Cu-IUD today. Took ativan earlier due to hx of anxiety. Reported an documented hx of passing out during procedures and injections.     Past Medical History:  Diagnosis Date  . Anxiety   . Bipolar 1 disorder ([place])   . Chlamydia infection   . Depression   . 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 Headache   . Supervision of high risk pregnancy, antepartum 04/13/2019    Nursing Staff Provider Office Location [place] Dating  LMP c/w 30wk [place] Language  english Anatomy US  Normal Flu Vaccine  declined Genetic Screen  NIPS:   AFP:   First Screen:  Quad:   TDaP vaccine  04/13/2019 Hgb A1C or  GTT 2hr GTT normal  Rhogam  04/06/2019   LAB RESULTS  Feeding Plan Breast Blood Type O/Negative/-- (02/15 1414)  Contraception Depo Antibody Positive, See Final Results (02/15 1414    Past Surgical History:  Procedure Laterality Date  . NO PAST SURGERIES      The following portions of the patient's history were reviewed and updated as appropriate: allergies, current medications, past family history, past medical history, past social history, past surgical history and problem list.   Review of Systems:  Pertinent items are noted in HPI. Comprehensive review of systems was otherwise negative.   Objective:  Physical Exam BP 108/79   Pulse 87   Ht '5\' 6"'$  (1.676 m)   Wt 140 lb 12.8 oz (63.9 kg)   BMI 22.73 kg/m    Physical Exam Vitals and nursing note reviewed. Exam conducted with a chaperone present.  Constitutional:      Appearance: Normal appearance.  HENT:     Head: Normocephalic and atraumatic.  Cardiovascular:     Rate and Rhythm: Normal rate and regular rhythm.  Pulmonary:     Effort: Pulmonary effort is normal.      Breath sounds: Normal breath sounds.  Genitourinary:    General: Normal vulva.     Exam position: Lithotomy position.     Vagina: Normal.     Cervix: Normal.  Skin:    General: Skin is warm and dry.  Neurological:     General: No focal deficit present.     Mental Status: She is alert.  Psychiatric:        Mood and Affect: Mood normal.        Behavior: Behavior normal.        Thought Content: Thought content normal.        Judgment: Judgment normal.    IUD Insertion Procedure Note Patient identified, informed consent performed, consent signed.   Discussed risks of irregular bleeding, cramping, infection, malpositioning or misplacement of the IUD outside the uterus which may require further procedure such as laparoscopy. Also discussed >99% contraception efficacy, increased risk of ectopic pregnancy with failure of method.  Time out was performed.  Urine pregnancy test negative.  Speculum placed in the vagina.  Cervix visualized.  Cleaned with Betadine x 2.  Cervix sprayed with anesthetic spray. Grasped anteriorly with a single tooth tenaculum.  Paracervical block was not administered.  Paragard IUD placed per manufacturer's recommendations.  Strings trimmed to 3 cm. Tenaculum was removed, good hemostasis noted.  Patient tolerated  procedure well.   Following insertion, while patient sitting and talking, stated she was 'passing out' and had a possible syncopal event. Patient eyes closed but responding to tactile and vocal stimuli   Patient was given post-procedure instructions.  She was advised to have backup contraception for one week.  Patient was also asked to check IUD strings periodically and follow up prn for IUD check.     Assessment & Plan:   1. Encounter for IUD insertion Now s/p   2. Negative pregnancy test ***   Routine preventative health maintenance measures emphasized.  Darliss Cheney, MD [facility]

## 2022-01-12 ENCOUNTER — Ambulatory Visit (INDEPENDENT_AMBULATORY_CARE_PROVIDER_SITE_OTHER): Payer: Medicaid Other | Admitting: Clinical

## 2022-01-12 DIAGNOSIS — F332 Major depressive disorder, recurrent severe without psychotic features: Secondary | ICD-10-CM

## 2022-01-12 NOTE — Progress Notes (Signed)
THERAPIST PROGRESS NOTE Virtual Visit via Video Note  I connected with Alice Castillo on 01/12/22 at 11:00 AM EST by a video enabled telemedicine application and verified that I am speaking with the correct person using two identifiers.  Location: Patient: home Provider: office   I discussed the limitations of evaluation and management by telemedicine and the availability of in person appointments. The patient expressed understanding and agreed to proceed.   Follow Up Instructions: I discussed the assessment and treatment plan with the patient. The patient was provided an opportunity to ask questions and all were answered. The patient agreed with the plan and demonstrated an understanding of the instructions.   The patient was advised to call back or seek an in-person evaluation if the symptoms worsen or if the condition fails to improve as anticipated.   Session Time: 30 minutes  Participation Level: Active  Behavioral Response: CasualAlertDepressed  Type of Therapy: Individual Therapy  Treatment Goals addressed: Client will practice behavioral activation skills 3 times per week for the next 12 weeks  ProgressTowards Goals: Progressing  Interventions: CBT and Supportive  Summary:  Alice Castillo is a 26 y.o. female who presents for the scheduled meds oriented x5, appropriately dressed, and friendly.  Client denies hallucinations and delusions. Client reported she has been feeling depressed and overwhelmed.  Client reported she picked up a work shift today which she usually does not do because of the kids schedule.  Client reported however she tried to enlist the help of her boyfriend to watch the kids and he made a huge fuss about it.  Client reported he overreacted to the kids and complained about how he would not be able to sleep.  Client reported she tries to vocalize her concerns but she feels unheard.  Client reported she has been trying to think about what to do to help  figure out how to balance the kids schedule and give her some hours to work.  Client reported she has been feeling lonely over the past few years.  Client reported she feels like she does her best but it is not enough, neither one of her sons fathers take her concerns seriously or listening to input that she tries to give. Evidence of progress towards goal: Client reported 1 negative believes/pattern that she thinks about herself which contributes to depression.  Client reported she has not been able to use any coping skills 0 days out of 7.  Suicidal/Homicidal: Nowithout intent/plan  Therapist Response:  Therapist began the appointment asking the client how she has been doing since last seen. Therapist used CBT to engage using active listening and positive emotional support towards her thoughts and feelings. Therapist used CBT to ask the client to identify the triggering event and sources of her current depressive thoughts or feelings. Therapist used CBT to normalize the clients emotional response and engage with her to brainstorm some alternative solutions to help her balance the kids schedule and her ability to work. Therapist used CBT to engage the client to reframe negative thoughts about self to more positive. Therapist used CBT ask the client to identify her progress with frequency of use with coping skills with continued practice in her daily activity.    Therapist assigned client homework to have the client to research when her sons homeschooling school year will end to brainstorm possibly putting him back into school next year to help her with her schedule.  Plan: Return again in 3 weeks.  Diagnosis: Severe episode of recurrent  major depressive disorder, without psychotic features  Collaboration of Care: Patient refused AEB none requested by the client at this time.  Patient/Guardian was advised Release of Information must be obtained prior to any record release in order to collaborate  their care with an outside provider. Patient/Guardian was advised if they have not already done so to contact the registration department to sign all necessary forms in order for [place] to release information regarding their care.   Consent: Patient/Guardian gives verbal consent for treatment and assignment of benefits for services provided during this visit. Patient/Guardian expressed understanding and agreed to proceed.   [place], LCSW 01/12/2022

## 2022-01-12 NOTE — Plan of Care (Signed)
  Problem: Depression CCP Problem  1  Goal: LTG: Zophia WILL SCORE LESS THAN 10 ON THE PATIENT HEALTH QUESTIONNAIRE (PHQ-9) Outcome: Progressing Goal: STG: Reduce overall depression score by a minimum of 25% on the Patient Health Questionnaire (PHQ-9) or the Montgomery-Asberg Depression Rating Scale (MADRS) Outcome: Progressing Goal: STG: Mariah WILL PRACTICE BEHAVIORAL ACTIVATION SKILLS 3 TIMES PER WEEK FOR THE NEXT 12 WEEKS Outcome: Progressing

## 2022-02-22 ENCOUNTER — Ambulatory Visit (INDEPENDENT_AMBULATORY_CARE_PROVIDER_SITE_OTHER): Payer: Medicaid Other | Admitting: Clinical

## 2022-02-22 DIAGNOSIS — F332 Major depressive disorder, recurrent severe without psychotic features: Secondary | ICD-10-CM

## 2022-02-23 NOTE — Progress Notes (Signed)
THERAPIST PROGRESS NOTE Virtual Visit via Video Note  I connected with Alice Castillo on 02/22/2022 at  2:00 PM EST by a video enabled telemedicine application and verified that I am speaking with the correct person using two identifiers.  Location: Patient: home Provider: office   I discussed the limitations of evaluation and management by telemedicine and the availability of in person appointments. The patient expressed understanding and agreed to proceed.   Follow Up Instructions: I discussed the assessment and treatment plan with the patient. The patient was provided an opportunity to ask questions and all were answered. The patient agreed with the plan and demonstrated an understanding of the instructions.   The patient was advised to call back or seek an in-person evaluation if the symptoms worsen or if the condition fails to improve as anticipated.   Session Time: 40 minutes  Participation Level: Active  Behavioral Response: CasualAlertDepressed  Type of Therapy: Individual Therapy  Treatment Goals addressed: client will practice behavioral activation skills 3 times per week for the next 12 weeks  ProgressTowards Goals: Progressing  Interventions: CBT and Supportive  Summary:  Alice Castillo is a 26 y.o. female who presents for the scheduled appointment oriented times five, appropriately dressed, and friendly. Client denied hallucinations and delusions. Client reported the holidays went okay for her and the kids. Client reported she has been having ongoing issues with her boyfriend. Client reported she does not like how he interacts with the children and talks to her. Client reported she had a long talk with him that lasted for hours about how she wants him to improve or she will leave. Client reported things have been a little better and will see if he sticks to it. Client reported otherwise she has been working on figuring out how to have her son 6 get more social time.  Client reported she is going to try to go to the [facility] with them early in the morning. 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 Client reported she always worried about saving and being able to provide food for the kids. Client reported she did reapply for food stamps. Client reported she has been taking medications from her medical doctor for migraines which have been working well. Evidence of progress towards goal:  client reported 1 positive of trying to find fun activities for her and kids to do together at least 4 days per week.   Suicidal/Homicidal: Nowithout intent/plan  Therapist Response:  Therapist began the appointment asking the client she has been doing since last seen. Therapist used CBT to engage using active listening and positive emotional support. Therapist used CBT to engage and ask the client to describe her stressors and severity of depressive symptoms. Therapist used CBT to normalize the client emotional response within reason. Therapist used CBT to discuss boundaries. Therapist used CBT ask the client to identify her progress with frequency of use with coping skills with continued practice in her daily activity.    Therapist assigned the client homework to practice self care.    Plan: Return again in 3 weeks.  Diagnosis: severe episode of recurrent major depressive disorder, without psychotic features  Collaboration of Care: Patient refused AEB none requested.  Patient/Guardian was advised Release of Information must be obtained prior to any record release in order to collaborate their care with an outside provider. Patient/Guardian was advised if they have  not already done so to contact the registration department to sign all necessary forms in order for [place] to release information regarding their care.   Consent: Patient/Guardian gives verbal consent for treatment and  assignment of benefits for services provided during this visit. Patient/Guardian expressed understanding and agreed to proceed.   [place], LCSW 02/22/2022

## 2022-02-24 ENCOUNTER — Encounter: Payer: Self-pay | Admitting: Obstetrics and Gynecology

## 2022-03-22 ENCOUNTER — Ambulatory Visit (INDEPENDENT_AMBULATORY_CARE_PROVIDER_SITE_OTHER): Payer: Medicaid Other | Admitting: Clinical

## 2022-03-22 DIAGNOSIS — F332 Major depressive disorder, recurrent severe without psychotic features: Secondary | ICD-10-CM | POA: Diagnosis not present

## 2022-03-22 NOTE — Progress Notes (Unsigned)
THERAPIST PROGRESS NOTE Virtual Visit via Video Note  I connected with Alice Castillo on 03/22/2022 at  1:00 PM EST by a video enabled telemedicine application and verified that I am speaking with the correct person using two identifiers.  Location: Patient: home Provider: office   I discussed the limitations of evaluation and management by telemedicine and the availability of in person appointments. The patient expressed understanding and agreed to proceed.   Follow Up Instructions:  I discussed the assessment and treatment plan with the patient. The patient was provided an opportunity to ask questions and all were answered. The patient agreed with the plan and demonstrated an understanding of the instructions.   The patient was advised to call back or seek an in-person evaluation if the symptoms worsen or if the condition fails to improve as anticipated.   Session Time: 45 minutes  Participation Level: Active  Behavioral Response: CasualAlertDepressed  Type of Therapy: Individual Therapy  Treatment Goals addressed: client will practice behavioral activation skills 3 times per week for the next 12 weeks  ProgressTowards Goals: Progressing  Interventions: CBT and Supportive  Summary:  Alice Castillo is a 27 y.o. female who presents for the scheduled appointment oriented x 5, appropriately dressed, friendly.  Client denies hallucinations and delusions. Client reported on today she has been trying to manage her stress.  Client reported she and her boyfriend were having car problems where neither one of them worked.  Client reported she is trying to find a place to that is affordable to fix her truck.  Client reported otherwise between her and her boyfriend some things have improved but he still has his bad days.  Client reported she just began to engage in family time but he often sits with a frown and access those something is wrong.  Client reported he tends to get more irritable  when she asked him if something is wrong.  Client reported although he has made a lot of changes he still acts uninterested in being involved as a partner to her and as a father.  Client reported otherwise she should discharge to focus on spending time with her kids and being able to work on the weekends. Evidence of progress towards goal:  client reported 1 positive of communicating her needs and boundaries within her relationship.   Suicidal/Homicidal: Nowithout intent/plan  Therapist Response:  Therapist began the appointment asking client how she has been doing since last seen. Therapist used CBT to engage using active listening and positive emotional support. Therapist used CBT to have the client to describe the current household dynamic and how it affects her emotions. Therapist used CBT to normalize the clients emotions. Therapist used CBT to engage the client in helping her to redirect negative thoughts to focusing on positive things that improve her mood. Therapist used CBT ask the client to identify her progress with frequency of use with coping skills with continued practice in her daily activity.    Therapist assigned the client homework to practice self care.   Plan: Return again in 3 weeks.  Diagnosis: severe episode of recurrent major depressive disorder, without psychotic features  Collaboration of Care: Patient refused AEB none requested by the client.  Patient/Guardian was advised Release of Information must be obtained prior to any record release in order to collaborate their care with an outside provider. Patient/Guardian was advised if they have not already done so to contact the registration department to sign all necessary forms in order for [place] to  release information regarding their care.   Consent: Patient/Guardian gives verbal consent for treatment and assignment of benefits for services provided during this visit. Patient/Guardian expressed understanding and agreed  to proceed.   [place], LCSW 03/22/2022

## 2022-03-30 ENCOUNTER — Ambulatory Visit (INDEPENDENT_AMBULATORY_CARE_PROVIDER_SITE_OTHER): Payer: Medicaid Other | Admitting: Physician Assistant

## 2022-03-30 ENCOUNTER — Encounter: Payer: Self-pay | Admitting: Physician Assistant

## 2022-03-30 VITALS — BP 110/75 | HR 70

## 2022-03-30 DIAGNOSIS — G43009 Migraine without aura, not intractable, without status migrainosus: Secondary | ICD-10-CM

## 2022-03-30 MED ORDER — RIZATRIPTAN BENZOATE 10 MG PO TABS: 10.0000 mg | ORAL_TABLET | ORAL | 2 refills | Status: DC | PRN

## 2022-03-30 NOTE — Progress Notes (Signed)
History:  Alice Castillo is a 27 y.o. [id number] who presents to clinic today for headache followup.  She does not feel improvement in headache at this time.  Sumatriptan caused her chest to feel tight and she was worried about having a heart problem.  She has a family h/o cardiac issues.  Cyclobenzaprine did not help with headache and she doesn't need it for muscle spasm.  Topiramate was used up to 2 tabs per evening.  She did not have side effects.  It may have been helpful the first few days but the improvement did not last.  For that reason, she discontinued.   She stopped all excedrin.  She stopped all caffeine.   She has seen behavioral health for therapy but hasn't seen benefit.  Not using medication for behavioral health.   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[place])    Headache    Supervision of high risk pregnancy, antepartum 04/13/2019    Nursing Staff Provider Office Location [place] Dating  LMP c/w 30wk [place] Language  english Anatomy US  Normal Flu Vaccine  declined Genetic Screen  NIPS:   AFP:   First Screen:  Quad:   TDaP vaccine  04/13/2019 Hgb A1C or  GTT 2hr GTT normal  Rhogam  04/06/2019   LAB RESULTS  Feeding Plan Breast Blood Type O/Negative/-- (02/15 1414)  Contraception Depo Antibody Positive, See Final Results (02/15 1414    Social History   Socioeconomic History   Marital status: Single    Spouse name: Not on file   Number of children: Not on file   Years of education: Not on file   Highest education level: Not on file  Occupational History   Not on file  Tobacco Use   Smoking status: Never    Passive exposure: Yes   Smokeless tobacco: Never  Vaping Use   Vaping Use: Never used  Substance and Sexual Activity   Alcohol use: Not Currently     Alcohol/week: 1.0 standard drink of alcohol    Types: 1 Shots of liquor per week    Comment: stopped when found out pregnant   Drug use: No   Sexual activity: Not Currently    Birth control/protection: None  Other Topics Concern   Not on file  Social History Narrative   Not on file   Social Determinants of Health   Financial Resource Strain: Not on file  Food Insecurity: No Food Insecurity (09/25/2021)   Hunger Vital Sign    Worried About Running Out of Food in the Last Year: Never true    Ran Out of Food in the Last Year: Never true  Transportation Needs: No Transportation Needs (09/25/2021)   PRAPARE - Hydrologist (Medical): No    Lack of Transportation (Non-Medical): No  Physical Activity: Not on file  Stress: Not on file  Social Connections: Not on file  Intimate Partner Violence: Not At Risk (04/13/2019)   Humiliation, Afraid, Rape, and Kick questionnaire    Fear of Current or Ex-Partner: No    Emotionally Abused: No    Physically Abused: No    Sexually Abused: No    Family History  Problem Relation Age  of Onset   Clotting disorder Father        Factor XI deficiency (affected or carrier?)   Heart disease Maternal Uncle    Diabetes Maternal Uncle    Stroke Maternal Uncle    Heart disease Paternal Uncle    Diabetes Paternal Uncle    Clotting disorder Paternal Uncle        Factor XI (52) deficiency   Clotting disorder Paternal Uncle        Factor XI deficiency   Diabetes Maternal Grandmother    Heart disease Maternal Grandmother    Stroke Maternal Grandmother    Diabetes Maternal Grandfather    Heart disease Maternal Grandfather    Stroke Maternal Grandfather     Allergies  Allergen Reactions   Lactose Intolerance (Gi) Other (See Comments)    Stomach irritation, and diarhea    Current Outpatient Medications on File Prior to Visit  Medication Sig Dispense Refill   cyclobenzaprine (FLEXERIL) 10 MG tablet Take 1 tablet (10 mg  total) by mouth every 8 (eight) hours as needed for muscle spasms (headache). 30 tablet 1   topiramate (TOPAMAX) 25 MG tablet Take 3 tablets (75 mg total) by mouth daily. 90 tablet 2   aspirin-acetaminophen-caffeine (EXCEDRIN MIGRAINE) 250-250-65 MG tablet Take 2 tablets by mouth every 6 (six) hours as needed for headache. (Patient not taking: Reported on 01/10/2022)     LORazepam (ATIVAN) 1 MG tablet Take 1 tablet (1 mg total) by mouth Once PRN for up to 1 dose for anxiety (prior to IUD insertion). (Patient not taking: Reported on 12/29/2021) 1 tablet 0   SUMAtriptan (IMITREX) 100 MG tablet Take 1 tablet (100 mg total) by mouth once as needed for up to 1 dose for migraine. May repeat in 2 hours if headache persists or recurs. (Patient not taking: Reported on 03/30/2022) 9 tablet 11   No current facility-administered medications on file prior to visit.     Review of Systems:  All pertinent positive/negative included in HPI, all other review of systems are negative   Objective:  Physical Exam BP 110/75   Pulse 70  CONSTITUTIONAL: Well-developed, well-nourished female in no acute distress.  EYES: EOM intact ENT: Normocephalic CARDIOVASCULAR: Regular rate   RESPIRATORY: Normal rate.  MUSCULOSKELETAL: Normal ROM SKIN: Warm, dry without erythema  NEUROLOGICAL: Alert, oriented, CN II-XII grossly intact, Appropriate balance PSYCH: Normal behavior, mood   Assessment & Plan:  Assessment: 1. Migraine without aura and without status migrainosus, not intractable    Medication overuse headache - improvement.    Plan: Will replace sumatriptan with rizatriptan.  If that is also problematic, will trial CGRP med for acute migraine.  Will re-try topiramate, titrating to '100mg'$  over time.   Follow-up in 3 months or sooner PRN Consider trying different type of therapy.  38 minutes face to face with patient during encounter.   Paticia Stack, PA-C 03/30/2022 11:14 AM

## 2022-04-13 ENCOUNTER — Ambulatory Visit (HOSPITAL_COMMUNITY): Payer: Medicaid Other | Admitting: Clinical

## 2022-06-18 ENCOUNTER — Ambulatory Visit: Payer: Medicaid Other

## 2022-06-18 VITALS — BP 108/74 | HR 72 | Ht 66.0 in | Wt 144.3 lb

## 2022-06-18 DIAGNOSIS — F411 Generalized anxiety disorder: Secondary | ICD-10-CM

## 2022-06-18 DIAGNOSIS — D681 Hereditary factor XI deficiency: Secondary | ICD-10-CM | POA: Diagnosis not present

## 2022-06-18 DIAGNOSIS — N924 Excessive bleeding in the premenopausal period: Secondary | ICD-10-CM

## 2022-06-18 DIAGNOSIS — Z832 Family history of diseases of the blood and blood-forming organs and certain disorders involving the immune mechanism: Secondary | ICD-10-CM | POA: Diagnosis not present

## 2022-06-18 DIAGNOSIS — Z1159 Encounter for screening for other viral diseases: Secondary | ICD-10-CM

## 2022-06-18 DIAGNOSIS — N920 Excessive and frequent menstruation with regular cycle: Secondary | ICD-10-CM | POA: Insufficient documentation

## 2022-06-18 MED ORDER — TOPIRAMATE 25 MG PO TABS: 75.0000 mg | ORAL_TABLET | Freq: Every day | ORAL | 2 refills | Status: DC

## 2022-06-18 MED ORDER — ESCITALOPRAM OXALATE 5 MG PO TABS: 5.0000 mg | ORAL_TABLET | Freq: Every day | ORAL | 2 refills | Status: DC

## 2022-06-18 NOTE — Assessment & Plan Note (Signed)
Patient presents with need for hep C screening. -Hep C antibody with reflex ordered

## 2022-06-18 NOTE — Patient Instructions (Signed)
Alice Castillo, it was a pleasure seeing you today!  Today we discussed: Bleeding - will get labs and refer back to hematology. Please call OBGYN to see if they'd like to see you back soon. Anxiety - Start Lexapro  I have ordered the following labs today:  Lab Orders         CBC no Diff       Tests ordered today:  none  Referrals ordered today:   Referral Orders         Ambulatory referral to Integrated Behavioral Health         Ambulatory referral to Hematology / Oncology       I have ordered the following medication/changed the following medications:   Stop the following medications: There are no discontinued medications.   Start the following medications: Meds ordered this encounter  Medications   escitalopram (LEXAPRO) 5 MG tablet    Sig: Take 1 tablet (5 mg total) by mouth daily.    Dispense:  30 tablet    Refill:  2     Follow-up: 3 months   Please make sure to arrive 15 minutes prior to your next appointment. If you arrive late, you may be asked to reschedule.   We look forward to seeing you next time. Please call our clinic at [phone] if you have any questions or concerns. The best time to call is Monday-Friday from 9am-4pm, but there is someone available 24/7. If after hours or the weekend, call the main hospital number and ask for the Internal Medicine Resident On-Call. If you need medication refills, please notify your pharmacy one week in advance and they will send [place] a request.  Thank you for letting us take part in your care. Wishing you the best!  Thank you, Adron Bene, MD
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
## 2022-06-18 NOTE — Assessment & Plan Note (Signed)
Patient presents with 64-month history of heavy vaginal bleeding where she soaks through multiple pads a day.  Discontinued Depo shot and underwent IUD insertion in November 2023.  She endorses regular periods before beginning Depo shot.  Did not bleed for several months but then developed heavier bleeding about 2 months ago.  Endorses fatigue.  She has a history of partial factor XI deficiency.  She is not currently on antifibrinolytic therapy. -CBC -New referral to hematology -Recommended she touch base with her OB/GYN

## 2022-06-19 LAB — CBC
Hematocrit: 41 % (ref 34.0–46.6)
Hemoglobin: 14 g/dL (ref 11.1–15.9)
MCH: 31.2 pg (ref 26.6–33.0)
MCHC: 34.1 g/dL (ref 31.5–35.7)
MCV: 91 fL (ref 79–97)
Platelets: 244 10*3/uL (ref 150–450)
RBC: 4.49 x10E6/uL (ref 3.77–5.28)
RDW: 11.7 % (ref 11.7–15.4)
WBC: 6.1 10*3/uL (ref 3.4–10.8)

## 2022-06-19 LAB — HCV INTERPRETATION

## 2022-06-19 LAB — HCV AB W REFLEX TO QUANT PCR: HCV Ab: NONREACTIVE

## 2022-06-26 ENCOUNTER — Ambulatory Visit (INDEPENDENT_AMBULATORY_CARE_PROVIDER_SITE_OTHER): Payer: Medicaid Other | Admitting: Obstetrics and Gynecology

## 2022-06-26 ENCOUNTER — Encounter: Payer: Self-pay | Admitting: Obstetrics and Gynecology

## 2022-06-26 ENCOUNTER — Other Ambulatory Visit: Payer: Self-pay

## 2022-06-26 VITALS — BP 116/80 | HR 71 | Wt 145.3 lb

## 2022-06-26 DIAGNOSIS — N939 Abnormal uterine and vaginal bleeding, unspecified: Secondary | ICD-10-CM | POA: Diagnosis not present

## 2022-06-26 MED ORDER — TRANEXAMIC ACID 650 MG PO TABS
1300.0000 mg | ORAL_TABLET | Freq: Three times a day (TID) | ORAL | 2 refills | Status: DC
Start: 2022-06-26 — End: 2023-06-24

## 2022-06-26 NOTE — Progress Notes (Signed)
GYNECOLOGY VISIT  Patient name: Alice Castillo MRN [id number]  Date of birth: Jan 23, 1996 Chief Complaint:   Gynecologic Exam   History:  Alice Castillo is a 27 y.o. [id number] being seen today for persistent heavy menstrual bleeding. Had previously been on depo but would pass out with depo. Does not want nexplanon as seeing/feeling the bar was unpleasant. Does not want IUD pulled unless it has to be. Concerned about emotional response to hormones - would be open to hormonal IUD if current IUD has to be pulled. Needs reliable contraception in place and avoiding anything that can interfere with emotions.    Past Medical History:  Diagnosis Date   Anxiety    Bipolar 1 disorder (HCC)    Chlamydia infection    Depression    Factor XI deficiency (HCC)    Headache    Major depressive disorder, recurrent, severe with psychotic features (HCC) 09/18/2014   Supervision of high risk pregnancy, antepartum 04/13/2019    Nursing Staff Provider Office Location [facility] Dating  LMP c/w 30wk [place] Language  english Anatomy US  Normal Flu Vaccine  declined Genetic Screen  NIPS:   AFP:   First Screen:  Quad:   TDaP vaccine  04/13/2019 Hgb A1C or  GTT 2hr GTT normal  Rhogam  04/06/2019   LAB RESULTS  Feeding Plan Breast Blood Type O/Negative/-- (02/15 1414)  Contraception Depo Antibody Positive, See Final Results (02/15 1414    Past Surgical History:  Procedure Laterality Date   NO PAST SURGERIES      The following portions of the patient's history were reviewed and updated as appropriate: allergies, current medications, past family history, past medical history, past social history, past surgical history and problem list.   Health Maintenance:   Last pap     Component Value Date/Time   DIAGPAP  08/11/2019 1045    - Negative for intraepithelial lesion or malignancy (NILM)   ADEQPAP  08/11/2019 1045    Satisfactory for evaluation; transformation zone component PRESENT.    Review of Systems:  Pertinent  items are noted in HPI. Comprehensive review of systems was otherwise negative.   Objective:  Physical Exam BP 116/80   Pulse 71   Wt 145 lb 4.8 oz (65.9 kg)   BMI 23.45 kg/m    Physical Exam Vitals and nursing note reviewed. Exam conducted with a chaperone present.  Constitutional:      Appearance: Normal appearance.  HENT:     Head: Normocephalic and atraumatic.  Pulmonary:     Effort: Pulmonary effort is normal.     Breath sounds: Normal breath sounds.  Genitourinary:    General: Normal vulva.     Exam position: Lithotomy position.     Vagina: Normal.     Cervix: Normal.     Comments: Right levator 0/10 Left levator 4/10 Uterus 0/10 Skin:    General: Skin is warm and dry.  Neurological:     General: No focal deficit present.     Mental Status: She is alert.  Psychiatric:        Mood and Affect: Mood normal.        Behavior: Behavior normal.        Thought Content: Thought content normal.        Judgment: Judgment normal.        Assessment & Plan:   1. Abnormal uterine bleeding (AUB) Discussed that AUB likely consequence of bleeding disorder (Factor XI deficiency) and inflammatory MOA of Cu-IUD. Pelvic US  ordered to verify position of IUD, no evidence of expulsion on exam today. Offered TXA for AUB during next bleed to see if lightens flow. Pt would prefer to keep IUD as she needs reliable form of contraception.   - tranexamic acid (LYSTEDA) 650 MG TABS tablet; Take 2 tablets (1,300 mg total) by mouth 3 (three) times daily. Take during menses for a maximum of five days  Dispense: 30 tablet; Refill: 2 - US PELVIC COMPLETE WITH TRANSVAGINAL; Future   Routine preventative health maintenance measures emphasized.  Lorriane Shire, MD Minimally Invasive Gynecologic [facility] for [facility]

## 2022-06-26 NOTE — Patient Instructions (Signed)
I have sent a prescription for tranexamic acid to take with your next period  I have ordered an ultrasound to double check the position of your IUD

## 2022-06-28 NOTE — Progress Notes (Signed)
Internal Medicine Clinic Attending  Case discussed with Dr. White  At the time of the visit.  We reviewed the resident's history and exam and pertinent patient test results.  I agree with the assessment, diagnosis, and plan of care documented in the resident's note.  

## 2022-07-02 ENCOUNTER — Ambulatory Visit (HOSPITAL_COMMUNITY)
Admission: RE | Admit: 2022-07-02 | Discharge: 2022-07-02 | Disposition: A | Discharge status: Home / Self Care | Payer: Medicaid Other | Source: Ambulatory Visit | Attending: Obstetrics and Gynecology | Admitting: Obstetrics and Gynecology

## 2022-07-02 DIAGNOSIS — N939 Abnormal uterine and vaginal bleeding, unspecified: Secondary | ICD-10-CM

## 2022-07-17 ENCOUNTER — Telehealth: Payer: Self-pay | Admitting: Hematology

## 2022-07-17 ENCOUNTER — Ambulatory Visit (INDEPENDENT_AMBULATORY_CARE_PROVIDER_SITE_OTHER): Payer: Medicaid Other | Admitting: Licensed Clinical Social Worker

## 2022-07-17 DIAGNOSIS — F411 Generalized anxiety disorder: Secondary | ICD-10-CM

## 2022-07-17 NOTE — BH Specialist Note (Signed)
Integrated Behavioral Health via Telemedicine Visit  07/17/2022 Alice Castillo [id number]  Number of Integrated Behavioral Health Clinician visits: No data recorded Session Start time: 1430   Session End time: 1530  Total time in minutes: 60   Referring Provider: Mikey Bussing. Rolm Gala Patient/Family location: Home [facility] Provider location: Office All persons participating in visit: [facility] and Patient Types of Service: Individual psychotherapy, Comprehensive Clinical Assessment (CCA), Telephone visit, and Health & Behavioral Assessment/Intervention  I connected with Alice Castillo via  Telephone or Video Enabled Telemedicine Application  (Video is Caregility application) and verified that I am speaking with the correct person using two identifiers. Discussed confidentiality: Yes   I discussed the limitations of telemedicine and the availability of in person appointments.  Discussed there is a possibility of technology failure and discussed alternative modes of communication if that failure occurs.  I discussed that engaging in this telemedicine visit, they consent to the provision of behavioral healthcare and the services will be billed under their insurance.  Patient and/or legal guardian expressed understanding and consented to Telemedicine visit: Yes   Presenting Concerns: Patient and/or family reports the following symptoms/concerns: Anxiety Duration of problem: over 1 year; Severity of problem: severe  Patient and/or Family's Strengths/Protective Factors: Sense of purpose  Goals Addressed: Patient will:  Reduce symptoms of: anxiety   Increase knowledge and/or ability of: coping skills    Progress towards Goals: Ongoing  Interventions: Interventions utilized:  Solution-Focused Strategies, Mindfulness or Relaxation Training, and Supportive Counseling Standardized Assessments completed: PHQ-SADS    Assessment: BHC introduced self on today. Patient discussed being a mom to a 29 and  29 year old.  Patient discussed severe anxiety attacks. [facility] educated patient on the "333" rule which is a deep breathing exercise.  Patient discussed applying for FNS benefits. Patient application is still pending.   Patient may benefit from ongoing therapy.  Plan: Follow up with behavioral health clinician on : 06/11 at 1:30 pm   I discussed the assessment and treatment plan with the patient and/or parent/guardian. They were provided an opportunity to ask questions and all were answered. They agreed with the plan and demonstrated an understanding of the instructions.   They were advised to call back or seek an in-person evaluation if the symptoms worsen or if the condition fails to improve as anticipated.  Christen Butter, MSW, LCSW-A She/Her Behavioral Health Clinician [facility]  Internal Medicine Center Direct Dial:[phone]  Fax [phone] Main Office Phone: [phone] [address] [place] [place], [place] [address] Website: [facility]  [place], [place]  [place]

## 2022-07-19 ENCOUNTER — Inpatient Hospital Stay: Payer: Medicaid Other

## 2022-07-19 ENCOUNTER — Inpatient Hospital Stay: Payer: Medicaid Other | Attending: Hematology | Admitting: Hematology

## 2022-07-19 VITALS — BP 98/73 | HR 77 | Temp 97.9°F | Resp 17 | Ht 66.0 in | Wt 140.9 lb

## 2022-07-19 DIAGNOSIS — Z87891 Personal history of nicotine dependence: Secondary | ICD-10-CM | POA: Insufficient documentation

## 2022-07-19 DIAGNOSIS — N920 Excessive and frequent menstruation with regular cycle: Secondary | ICD-10-CM | POA: Diagnosis not present

## 2022-07-19 DIAGNOSIS — Z79899 Other long term (current) drug therapy: Secondary | ICD-10-CM | POA: Diagnosis not present

## 2022-07-19 DIAGNOSIS — D681 Hereditary factor XI deficiency: Secondary | ICD-10-CM | POA: Diagnosis present

## 2022-07-19 DIAGNOSIS — F319 Bipolar disorder, unspecified: Secondary | ICD-10-CM | POA: Diagnosis not present

## 2022-07-19 DIAGNOSIS — N921 Excessive and frequent menstruation with irregular cycle: Secondary | ICD-10-CM

## 2022-07-19 DIAGNOSIS — D649 Anemia, unspecified: Secondary | ICD-10-CM

## 2022-07-19 LAB — IRON AND IRON BINDING CAPACITY (CC-WL,HP ONLY)
Iron: 72 ug/dL (ref 28–170)
Saturation Ratios: 19 % (ref 10.4–31.8)
TIBC: 381 ug/dL (ref 250–450)
UIBC: 309 ug/dL (ref 148–442)

## 2022-07-19 LAB — CBC WITH DIFFERENTIAL (CANCER CENTER ONLY)
Abs Immature Granulocytes: 0.01 10*3/uL (ref 0.00–0.07)
Basophils Absolute: 0 10*3/uL (ref 0.0–0.1)
Basophils Relative: 1 %
Eosinophils Absolute: 0.2 10*3/uL (ref 0.0–0.5)
Eosinophils Relative: 5 %
HCT: 37.6 % (ref 36.0–46.0)
Hemoglobin: 13.3 g/dL (ref 12.0–15.0)
Immature Granulocytes: 0 %
Lymphocytes Relative: 36 %
Lymphs Abs: 1.5 10*3/uL (ref 0.7–4.0)
MCH: 31.1 pg (ref 26.0–34.0)
MCHC: 35.4 g/dL (ref 30.0–36.0)
MCV: 88.1 fL (ref 80.0–100.0)
Monocytes Absolute: 0.3 10*3/uL (ref 0.1–1.0)
Monocytes Relative: 7 %
Neutro Abs: 2.1 10*3/uL (ref 1.7–7.7)
Neutrophils Relative %: 51 %
Platelet Count: 238 10*3/uL (ref 150–400)
RBC: 4.27 MIL/uL (ref 3.87–5.11)
RDW: 11.6 % (ref 11.5–15.5)
WBC Count: 4.2 10*3/uL (ref 4.0–10.5)
nRBC: 0 % (ref 0.0–0.2)

## 2022-07-19 LAB — CMP (CANCER CENTER ONLY)
ALT: 15 U/L (ref 0–44)
AST: 14 U/L — ABNORMAL LOW (ref 15–41)
Albumin: 4.6 g/dL (ref 3.5–5.0)
Alkaline Phosphatase: 64 U/L (ref 38–126)
Anion gap: 6 (ref 5–15)
BUN: 10 mg/dL (ref 6–20)
CO2: 28 mmol/L (ref 22–32)
Calcium: 9.2 mg/dL (ref 8.9–10.3)
Chloride: 107 mmol/L (ref 98–111)
Creatinine: 0.63 mg/dL (ref 0.44–1.00)
GFR, Estimated: >60 mL/min (ref >60)
Glucose, Bld: 72 mg/dL (ref 70–99)
Potassium: 3.4 mmol/L — ABNORMAL LOW (ref 3.5–5.1)
Sodium: 141 mmol/L (ref 135–145)
Total Bilirubin: 0.4 mg/dL (ref 0.3–1.2)
Total Protein: 7.5 g/dL (ref 6.5–8.1)

## 2022-07-19 LAB — FERRITIN: Ferritin: 19 ng/mL (ref 11–307)

## 2022-07-19 LAB — APTT: aPTT: 35 seconds (ref 24–36)

## 2022-07-19 NOTE — Progress Notes (Signed)
HEMATOLOGY/ONCOLOGY CLINIC NOTE  Date of Service: 07/19/22   OB Gyn: Hurshel Party, CNM and Rhea Pink, CNM CNM  CHIEF COMPLAINTS/PURPOSE OF CONSULTATION:   F/u for Partial Factor XI deficiency.  HISTORY OF PRESENTING ILLNESS: please see my initial consultation for details on initial presentation.  INTERVAL HISTORY  Alice Castillo is a 27 y.o. 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 Patient was last seen by me on 11/08/2015 and reported minimal rectal bleeding on one occasion.   Today, she reports that she had her copper IUD placed in November 2023. She reports that she previously received progesterone injections which did resolve periods, though it did cause syncope episodes. She has since had a copper IUD placed.   Patient has endorsed heavy menstrual cycles over the course of the last couple of months. She notes needing to switch her pad every hour. She reports always feeling lightheaded/dizzy in general. She denies any bleeding issues after giving birth to her second son 3 years ago.   She has been regularly taking Lysteda to manage bleeding. She has been taking it since last week which has not improved heavy bleeding. Her last menstrual cycle ended yesterday.   She denies any other bleeding issues, such as bruising, nose bleeds, or gum bleeds.  She denies any previous blood in the stool or urine prior to endorsing heavy menstrual cycles. She is not taking any oral iron at this time.  MEDICAL HISTORY:  Past Medical History:  Diagnosis Date   Anxiety    Bipolar 1 disorder (HCC)    Chlamydia infection    Depression    Factor XI deficiency (HCC)    Headache     SURGICAL HISTORY: Past Surgical History:  Procedure Laterality Date   NO PAST SURGERIES      SOCIAL HISTORY: Social History   Social History   Marital status: Single    Spouse name: N/A   Number of children: N/A   Years of education: N/A    Occupational History   Not on file.   Social History Main Topics   Smoking status: Never Smoker   Smokeless tobacco: Never Used   Alcohol use 0.6 oz/week    1 Shots of liquor per week     Comment: stopped when found out pregnant   Drug use: No   Sexual activity: Not Currently    Birth control/ protection: None   Other Topics Concern   Not on file   Social History Narrative   No narrative on file    FAMILY HISTORY: Family History  Problem Relation Age of Onset   Clotting disorder Father     Factor XI deficiency (affected or carrier?)   Heart disease Maternal Uncle    Diabetes Maternal Uncle    Stroke Maternal Uncle    Heart disease Paternal Uncle    Diabetes Paternal Uncle    Clotting disorder Paternal Uncle     Factor XI (11) deficiency   Clotting disorder Paternal Uncle     Factor XI deficiency   Diabetes Maternal Grandmother    Heart disease Maternal Grandmother    Stroke Maternal Grandmother    Diabetes Maternal Grandfather    Heart disease Maternal Grandfather    Stroke Maternal Grandfather     ALLERGIES:  is allergic to food and lactose intolerance (gi).  MEDICATIONS:  Current Outpatient Prescriptions  Medication Sig Dispense Refill   acetaminophen (TYLENOL) 325 MG tablet Take 2  tablets (650 mg total) by mouth every 4 (four) hours as needed (for pain scale < 4). (Patient not taking: Reported on 11/08/2015) 90 tablet 3   docusate sodium (COLACE) 100 MG capsule Take 1 capsule (100 mg total) by mouth 2 (two) times daily. (Patient not taking: Reported on 11/08/2015) 20 capsule 0   No current facility-administered medications for this visit.     REVIEW OF SYSTEMS:    10 Point review of Systems was done is negative except as noted above.   PHYSICAL EXAMINATION: ECOG PERFORMANCE STATUS: 0 - Asymptomatic  . Vitals:   11/08/15 1533  BP: 95/69  Pulse: 85  Resp: 17  Temp: 98.3 F (36.8 C)   Filed Weights   11/08/15 1533  Weight: 129 lb (58.5 kg)    .Body mass index is 20.82 kg/m.   GENERAL:alert, in no acute distress and comfortable SKIN: no acute rashes, no significant lesions EYES: conjunctiva are pink and non-injected, sclera anicteric OROPHARYNX: MMM, no exudates, no oropharyngeal erythema or ulceration NECK: supple, no JVD LYMPH:  no palpable lymphadenopathy in the cervical, axillary or inguinal regions LUNGS: clear to auscultation b/l with normal respiratory effort HEART: regular rate & rhythm ABDOMEN:  normoactive bowel sounds , non tender, not distended. Extremity: no pedal edema PSYCH: alert & oriented x 3 with fluent speech NEURO: no focal motor/sensory deficits   LABORATORY DATA:  I have reviewed the data as listed  .    Latest Ref Rng & Units 07/19/2022   12:20 PM 06/18/2022    2:31 PM 07/10/2019    7:30 AM  CBC  WBC 4.0 - 10.5 K/uL 4.2  6.1  14.1   Hemoglobin 12.0 - 15.0 g/dL 16.1  09.6  04.5   Hematocrit 36.0 - 46.0 % 37.6  41.0  42.0   Platelets 150 - 400 K/uL 238  244  209    . CBC    Component Value Date/Time   WBC 4.2 07/19/2022 1220   WBC 14.1 (H) 07/10/2019 0730   RBC 4.27 07/19/2022 1220   HGB 13.3 07/19/2022 1220   HGB 14.0 06/18/2022 1431   HGB 12.4 08/09/2015 1615   HCT 37.6 07/19/2022 1220   HCT 41.0 06/18/2022 1431   HCT 37.1 08/09/2015 1615   PLT 238 07/19/2022 1220   PLT 244 06/18/2022 1431   MCV 88.1 07/19/2022 1220   MCV 91 06/18/2022 1431   MCV 83.4 08/09/2015 1615   MCH 31.1 07/19/2022 1220   MCHC 35.4 07/19/2022 1220   RDW 11.6 07/19/2022 1220   RDW 11.7 06/18/2022 1431   RDW 12.6 08/09/2015 1615   LYMPHSABS 1.5 07/19/2022 1220   LYMPHSABS 1.0 04/13/2019 1414   LYMPHSABS 2.2 08/09/2015 1615   MONOABS 0.3 07/19/2022 1220   MONOABS 0.5 08/09/2015 1615   EOSABS 0.2 07/19/2022 1220   EOSABS 0.2 04/13/2019 1414   BASOSABS 0.0 07/19/2022 1220   BASOSABS 0.0 04/13/2019 1414   BASOSABS 0.0 08/09/2015 1615   .    Latest Ref Rng & Units 07/19/2022   12:20 PM 08/09/2015     4:15 PM 04/01/2015   11:28 AM  CMP  Glucose 70 - 99 mg/dL 72  83  77   BUN 6 - 20 mg/dL 10  9.1  4.9   Creatinine 0.44 - 1.00 mg/dL 4.09  0.7  0.6   Sodium 135 - 145 mmol/L 141  142  139   Potassium 3.5 - 5.1 mmol/L 3.4  3.8  3.5   Chloride 98 -  111 mmol/L 107     CO2 22 - 32 mmol/L 28  26  22    Calcium 8.9 - 10.3 mg/dL 9.2  9.6  9.1   Total Protein 6.5 - 8.1 g/dL 7.5  7.7  6.5   Total Bilirubin 0.3 - 1.2 mg/dL 0.4  6.04  5.40   Alkaline Phos 38 - 126 U/L 64  114  104   AST 15 - 41 U/L 14  23  14    ALT 0 - 44 U/L 15  41  13      RADIOGRAPHIC STUDIES: I have personally reviewed the radiological images as listed and agreed with the findings in the report. No results found.  ASSESSMENT & PLAN:   27 year old Caucasian female with a family history of possible factor XI deficiency in her father and paternal uncle.  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 Factor XI levels near to pregnancy were 51%. She did not have significant bleeding during her vaginal delivery except with some bleeding due to her perineal injuries requiring sutures. Was on Lysteda for about 8 days after discharge from the hospital. Patient reports she had her first period after her delivery recently and it was somewhat heavier. The patient notes that she has not had regular periods and therefore we cannot assess if they are heavy.  He reports no other evidence of easy bruisability or bleeding  #2 Menorrhagia -- primarily due to DUB or uterine issues. Was well controlled with depo-medroxyprogesterone but this was stopped since she "passed out:" with the shots. Currently has Copper IUD which is not controlling her periods. No other sites of abnormal bleeding or bruising PLAN:  -Discussed lab results from 06/18/2022 in detail with patient. CBC showed WBC of 6.1K, hemoglobin of  14.0, and platelets of 244K. -no anemia -recent US showed IUD was in place -factor XI deficiency is stable and very mild at this time, levels are not low enough to cause systemic bleeding risks at this time -informed patient that copper IUD tends to cause cramping and bleeding -informed patient that hormonal IUD is generally only effective locally in the uterus and more likely to control menstrual cycles -Patient is hesitant to receive systemic hormones due to effects on mood -recommend progesterone-releasing IUD to minimize systemic hormones and better control menstrual cycles and use Lysteda as needed to control bleeding -will recheck factor XI levels -Continue follow-up with primary care physician and gynecologist .  FOLLOW-UP: Labs today Phone visit with Dr Candise Che in 1 week  The total time spent in the appointment was 45 minutes* .  All of the patient's questions were answered with apparent satisfaction. The patient knows to call the clinic with any problems, questions or concerns.   Wyvonnia Lora MD Alice AAHIVMS [facility] Hematology/Oncology Physician [facility]  .*Total Encounter Time as defined by the Centers for Medicare and Medicaid Services includes, in addition to the face-to-face time of a patient visit (documented in the note above) non-face-to-face time: obtaining and reviewing outside history, ordering and reviewing medications, tests or procedures, care coordination (communications with other health care professionals or caregivers) and documentation in the medical record.    I,Alice Castillo,acting as a Neurosurgeon for Wyvonnia Lora, MD.,have documented all relevant documentation on the behalf of Wyvonnia Lora, MD,as directed by  Wyvonnia Lora, MD while in the presence of Wyvonnia Lora, MD.  .I have  reviewed the above documentation for accuracy and completeness, and I agree with the above. Johney Maine MD

## 2022-07-21 LAB — FACTOR 11 ASSAY: Factor XI Activity: 51 % — ABNORMAL LOW (ref 60–150)

## 2022-07-24 ENCOUNTER — Telehealth: Payer: Self-pay | Admitting: Hematology

## 2022-07-26 ENCOUNTER — Inpatient Hospital Stay (HOSPITAL_BASED_OUTPATIENT_CLINIC_OR_DEPARTMENT_OTHER): Payer: Medicaid Other | Admitting: Hematology

## 2022-07-26 DIAGNOSIS — N921 Excessive and frequent menstruation with irregular cycle: Secondary | ICD-10-CM

## 2022-07-26 DIAGNOSIS — D681 Hereditary factor XI deficiency: Secondary | ICD-10-CM | POA: Diagnosis not present

## 2022-07-26 MED ORDER — POLYSACCHARIDE IRON COMPLEX 150 MG PO CAPS: 150.0000 mg | ORAL_CAPSULE | Freq: Every day | ORAL | 3 refills | Status: AC

## 2022-07-26 NOTE — Progress Notes (Signed)
HEMATOLOGY/ONCOLOGY PHONE VISIT NOTE  Date of Service: 07/26/22   OB Gyn: Alice Castillo, CNM and Alice Castillo, CNM CNM  CHIEF COMPLAINTS/PURPOSE OF CONSULTATION:   F/u for Partial Factor XI deficiency.  HISTORY OF PRESENTING ILLNESS: please see my initial consultation for details on initial presentation.  INTERVAL HISTORY  Alice Castillo is a 27 y.o. 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 Patient was last seen by me on 07/19/2022 and complained of heavy menstrual cycles and persistent lightheadedness/dizziness.  I connected with Alice Castillo on 07/26/22 at  1:00 PM EDT by telephone visit and verified that I am speaking with the correct person using two identifiers.   I discussed the limitations, risks, security and privacy concerns of performing an evaluation and management service by telemedicine and the availability of in-person appointments. I also discussed with the patient that there may be a patient responsible charge related to this service. The patient expressed understanding and agreed to proceed.   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    The results of her recent lab workup was discussed with patient at great length.  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/18/2014   Supervision of high risk pregnancy, antepartum 04/13/2019    Nursing Staff Provider Office Location [facility] Dating  LMP c/w 30wk [place] Language  english Anatomy US  Normal Flu Vaccine  declined Genetic Screen  NIPS:   AFP:   First Screen:  Quad:   TDaP  vaccine  04/13/2019 Hgb A1C or  GTT 2hr GTT normal  Rhogam  04/06/2019   LAB RESULTS  Feeding Plan Breast Blood Type O/Negative/-- (02/15 1414)  Contraception Depo Antibody Positive, See Final Results (02/15 1414    SURGICAL HISTORY: Past Surgical History:  Procedure Laterality Date   NO PAST SURGERIES      SOCIAL HISTORY: Social History   Socioeconomic History   Marital status: Single    Spouse name: Not on file   Number of children: Not on file   Years of education: Not on file   Highest education level: Not on file  Occupational History   Not on file  Tobacco Use   Smoking status: Never    Passive exposure: Yes   Smokeless tobacco: Never  Vaping Use   Vaping Use: Never used  Substance and Sexual Activity   Alcohol use: Not Currently    Alcohol/week: 1.0 standard drink of alcohol    Types: 1 Shots of liquor per week    Comment: stopped when found out pregnant   Drug use: No   Sexual activity: Not Currently    Birth control/protection: None  Other Topics Concern   Not on file  Social History Narrative   Not on file   Social Determinants of Health   Financial Resource Strain: Not on file  Food Insecurity: No Food Insecurity (09/25/2021)   Hunger Vital Sign    Worried About Running Out of Food in the Last Year: Never true    Ran Out of Food in the Last  Year: Never true  Transportation Needs: No Transportation Needs (09/25/2021)   PRAPARE - Administrator, Civil Service (Medical): No    Lack of Transportation (Non-Medical): No  Physical Activity: Not on file  Stress: Not on file  Social Connections: Not on file  Intimate Partner Violence: Not At Risk (04/13/2019)   Humiliation, Afraid, Rape, and Kick questionnaire    Fear of Current or Ex-Partner: No    Emotionally Abused: No    Physically Abused: No    Sexually Abused: No    FAMILY HISTORY: Family History  Problem Relation Age of Onset   Clotting disorder Father        Factor XI deficiency  (affected or carrier?)   Heart disease Maternal Uncle    Diabetes Maternal Uncle    Stroke Maternal Uncle    Heart disease Paternal Uncle    Diabetes Paternal Uncle    Clotting disorder Paternal Uncle        Factor XI (11) deficiency   Clotting disorder Paternal Uncle        Factor XI deficiency   Diabetes Maternal Grandmother    Heart disease Maternal Grandmother    Stroke Maternal Grandmother    Diabetes Maternal Grandfather    Heart disease Maternal Grandfather    Stroke Maternal Grandfather     ALLERGIES:  is allergic to lactose intolerance (gi).  MEDICATIONS:  Current Outpatient Medications  Medication Sig Dispense Refill   aspirin-acetaminophen-caffeine (EXCEDRIN MIGRAINE) 250-250-65 MG tablet Take 2 tablets by mouth every 6 (six) hours as needed for headache.     cyclobenzaprine (FLEXERIL) 10 MG tablet Take 1 tablet (10 mg total) by mouth every 8 (eight) hours as needed for muscle spasms (headache). 30 tablet 1   escitalopram (LEXAPRO) 5 MG tablet Take 1 tablet (5 mg total) by mouth daily. 30 tablet 2   rizatriptan (MAXALT) 10 MG tablet Take 1 tablet (10 mg total) by mouth as needed for migraine. May repeat in 2 hours if needed (Patient not taking: Reported on 07/19/2022) 10 tablet 2   SUMAtriptan (IMITREX) 100 MG tablet Take 1 tablet (100 mg total) by mouth once as needed for up to 1 dose for migraine. May repeat in 2 hours if headache persists or recurs. (Patient not taking: Reported on 03/30/2022) 9 tablet 11   topiramate (TOPAMAX) 25 MG tablet Take 3 tablets (75 mg total) by mouth daily. 90 tablet 2   tranexamic acid (LYSTEDA) 650 MG TABS tablet Take 2 tablets (1,300 mg total) by mouth 3 (three) times daily. Take during menses for a maximum of five days 30 tablet 2   No current facility-administered medications for this visit.    REVIEW OF SYSTEMS:    10 Point review of Systems was done is negative except as noted above.   PHYSICAL EXAMINATION: TELEMEDICINE  VISIt  LABORATORY DATA:  I have reviewed the data as listed  .    Latest Ref Rng & Units 07/19/2022   12:20 PM 06/18/2022    2:31 PM 07/10/2019    7:30 AM  CBC  WBC 4.0 - 10.5 K/uL 4.2  6.1  14.1   Hemoglobin 12.0 - 15.0 g/dL 40.9  81.1  91.4   Hematocrit 36.0 - 46.0 % 37.6  41.0  42.0   Platelets 150 - 400 K/uL 238  244  209    . CBC    Component Value Date/Time   WBC 4.2 07/19/2022 1220   WBC 14.1 (H) 07/10/2019 0730  RBC 4.27 07/19/2022 1220   HGB 13.3 07/19/2022 1220   HGB 14.0 06/18/2022 1431   HGB 12.4 08/09/2015 1615   HCT 37.6 07/19/2022 1220   HCT 41.0 06/18/2022 1431   HCT 37.1 08/09/2015 1615   PLT 238 07/19/2022 1220   PLT 244 06/18/2022 1431   MCV 88.1 07/19/2022 1220   MCV 91 06/18/2022 1431   MCV 83.4 08/09/2015 1615   MCH 31.1 07/19/2022 1220   MCHC 35.4 07/19/2022 1220   RDW 11.6 07/19/2022 1220   RDW 11.7 06/18/2022 1431   RDW 12.6 08/09/2015 1615   LYMPHSABS 1.5 07/19/2022 1220   LYMPHSABS 1.0 04/13/2019 1414   LYMPHSABS 2.2 08/09/2015 1615   MONOABS 0.3 07/19/2022 1220   MONOABS 0.5 08/09/2015 1615   EOSABS 0.2 07/19/2022 1220   EOSABS 0.2 04/13/2019 1414   BASOSABS 0.0 07/19/2022 1220   BASOSABS 0.0 04/13/2019 1414   BASOSABS 0.0 08/09/2015 1615   .    Latest Ref Rng & Units 07/19/2022   12:20 PM 08/09/2015    4:15 PM 04/01/2015   11:28 AM  CMP  Glucose 70 - 99 mg/dL 72  83  77   BUN 6 - 20 mg/dL 10  9.1  4.9   Creatinine 0.44 - 1.00 mg/dL 0.98  0.7  0.6   Sodium 135 - 145 mmol/L 141  142  139   Potassium 3.5 - 5.1 mmol/L 3.4  3.8  3.5   Chloride 98 - 111 mmol/L 107     CO2 22 - 32 mmol/L 28  26  22    Calcium 8.9 - 10.3 mg/dL 9.2  9.6  9.1   Total Protein 6.5 - 8.1 g/dL 7.5  7.7  6.5   Total Bilirubin 0.3 - 1.2 mg/dL 0.4  1.19  1.47   Alkaline Phos 38 - 126 U/L 64  114  104   AST 15 - 41 U/L 14  23  14    ALT 0 - 44 U/L 15  41  13      RADIOGRAPHIC STUDIES: I have personally reviewed the radiological images as listed and agreed  with the findings in the report. US PELVIC COMPLETE WITH TRANSVAGINAL  Result Date: 07/04/2022 CLINICAL DATA:  Abnormal uterine bleeding EXAM: TRANSABDOMINAL AND TRANSVAGINAL ULTRASOUND OF PELVIS TECHNIQUE: Both transabdominal and transvaginal ultrasound examinations of the pelvis were performed. Transabdominal technique was performed for global imaging of the pelvis including uterus, ovaries, adnexal regions, and pelvic cul-de-sac. It was necessary to proceed with endovaginal exam following the transabdominal exam to visualize the uterus endometrium ovaries. COMPARISON:  None Available. FINDINGS: Uterus Measurements: 9.5 x 4.3 x 5.4 cm = volume: 116.8 mL. No fibroids or other mass visualized. Endometrium Thickness: 9.3 mm.  IUD in the upper uterine segment Right ovary Measurements: 5.2 x 2.7 x 2.7 cm = volume: 20.4 mL. Normal appearance/no adnexal mass. Left ovary Measurements: 5.3 x 3.8 x 4.1 cm = volume: 43.3 mL. Cyst measuring 4.7 x 3.3 x 4.3 cm. Other findings No abnormal free fluid. IMPRESSION: 1. IUD in the upper uterine segment and appears grossly appropriate in position by sonography. 2. 4.7 cm left ovarian benign functional cyst, no follow-up imaging is recommended. No follow up imaging recommended. Note: This recommendation does not apply to premenarchal patients or to those with increased risk (genetic, family history, elevated tumor markers or other high-risk factors) of ovarian cancer. Reference: Radiology 2019 Nov; 293(2):359-371. 3. Otherwise negative pelvic ultrasound. Electronically Signed   By: Adrian Prows.D.  On: 07/04/2022 21:14    ASSESSMENT & PLAN:   27 year old Caucasian female with a family history of possible factor XI deficiency in her father and paternal uncle.  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 Factor XI levels near to pregnancy  were 51%. She did not have significant bleeding during her vaginal delivery except with some bleeding due to her perineal injuries requiring sutures. Was on Lysteda for about 8 days after discharge from the hospital. Patient reports she had her first period after her delivery recently and it was somewhat heavier. The patient notes that she has not had regular periods and therefore we cannot assess if they are heavy.  He reports no other evidence of easy bruisability or bleeding  #2 Menorrhagia -- primarily due to DUB or uterine issues. Was well controlled with depo-medroxyprogesterone but this was stopped since she "passed out:" with the shots. Currently has Copper IUD which is not controlling her periods. No other sites of abnormal bleeding or bruising  PLAN:  -Discussed lab results from 07/19/2022 in detail with patient. CBC normal, showed WBC of 4.2K, hemoglobin of 13.3, and platelets of 238K. -somewhat iron deficient -ferritin low, no anemia -no IV iron required at this time -recent US showed ovarian cyst -will order iron polysaccharide once daily to build iron stores. Patient may also take any OTC iron supplement. May cause some constipation or dark stools, Rec water and adequate fiber intake to improve. -factor XI levels stable at 50% over the ast 6-7 years. Risk of spontaneous bleeding from Factor not high. Unlikely that it is the primary cause of menoorgia.  -No factor XI replacement required at this time -Advised patient to avoid all NSAID medications such as Ibuprofen, Aspirin, Aleve, etc. -recommend patient to switch to progesterone IUD or other estrogen-containing OCP to minimize systemic hormones and better control menstrual cycles -continue to follow regularly with OB/GYN and PCP -patient shall return to clinic as needed  FOLLOW-UP: RTC with Dr Candise Che as needed  The total time spent in the appointment was 20 minutes* .  All of the patient's questions were answered with  apparent satisfaction. The patient knows to call the clinic with any problems, questions or concerns.   Wyvonnia Lora MD Alice AAHIVMS [facility] Hematology/Oncology Physician [facility]  .*Total Encounter Time as defined by the Centers for Medicare and Medicaid Services includes, in addition to the face-to-face time of a patient visit (documented in the note above) non-face-to-face time: obtaining and reviewing outside history, ordering and reviewing medications, tests or procedures, care coordination (communications with other health care professionals or caregivers) and documentation in the medical record.    I,Mitra Faeizi,acting as a Neurosurgeon for Wyvonnia Lora, MD.,have documented all relevant documentation on the behalf of Wyvonnia Lora, MD,as directed by  Wyvonnia Lora, MD while in the presence of Wyvonnia Lora, MD.  .I have reviewed the above documentation for accuracy and completeness, and I agree with the above. Johney Maine MD

## 2022-08-06 ENCOUNTER — Inpatient Hospital Stay: Payer: Medicaid Other

## 2022-08-06 ENCOUNTER — Inpatient Hospital Stay: Payer: Medicaid Other | Admitting: Hematology

## 2022-08-15 ENCOUNTER — Ambulatory Visit: Payer: Medicaid Other | Admitting: Licensed Clinical Social Worker

## 2022-08-16 ENCOUNTER — Telehealth: Payer: Self-pay | Admitting: *Deleted

## 2022-08-16 ENCOUNTER — Ambulatory Visit (INDEPENDENT_AMBULATORY_CARE_PROVIDER_SITE_OTHER): Payer: Medicaid Other | Admitting: Licensed Clinical Social Worker

## 2022-08-16 DIAGNOSIS — F411 Generalized anxiety disorder: Secondary | ICD-10-CM

## 2022-08-16 NOTE — BH Specialist Note (Signed)
Referring Provider: Mikey Bussing. Rolm Gala Patient/Family location: Home [facility] Provider location: Office All persons participating in visit: [facility] and Patient Types of Service: Individual psychotherapy, Comprehensive Clinical Assessment (CCA), Telephone visit, and Health & Behavioral Assessment/Intervention   I connected with Briony Coscia via  Telephone or Video Enabled Telemedicine Application  (Video is Caregility application) and verified that I am speaking with the correct person using two identifiers. Discussed confidentiality: Yes    I discussed the limitations of telemedicine and the availability of in person appointments.  Discussed there is a possibility of technology failure and discussed alternative modes of communication if that failure occurs.   I discussed that engaging in this telemedicine visit, they consent to the provision of behavioral healthcare and the services will be billed under their insurance.   Patient and/or legal guardian expressed understanding and consented to Telemedicine visit: Yes    Presenting Concerns: Patient and/or family reports the following symptoms/concerns: Anxiety Duration of problem: over 1 year; Severity of problem: severe   Patient and/or Family's Strengths/Protective Factors: Sense of purpose   Goals Addressed: Patient will:  Reduce symptoms of: anxiety   Increase knowledge and/or ability of: coping skills      Progress towards Goals: Ongoing   Interventions: Interventions utilized:  Solution-Focused Strategies, Mindfulness or Relaxation Training, and Supportive Counseling Standardized Assessments completed: PHQ-SADS       Assessment: Patient discussed nightmares. Patient stated she isn't sleeping. [facility] recommended contact PCP. [facility] will send PCP a inbasket. Patient denied wanting to try sleep aids.   Patient and [facility] discussed safety of children when patient is having anxiety attacks. Patient place children in a safe area in their room and  patients goes into another room to take deep breathes.   Patient priority is the safety of the children. [facility] educated patient on emergency services for suicidal ideations or if patient is experiencing uncontrollable thoughts or anxiety attacks.  Patient denied thoughts of self harm to self or others.   Patient discussed severe anxiety attacks. [facility] educated patient on the "333" rule which is a deep breathing exercise.   Patient discussed applying for FNS benefits. Patient application is still pending.    Patient may benefit from ongoing therapy.   Plan: Follow up with behavioral health clinician on : 09/10/22 via telephone     I discussed the assessment and treatment plan with the patient and/or parent/guardian. They were provided an opportunity to ask questions and all were answered. They agreed with the plan and demonstrated an understanding of the instructions.   They were advised to call back or seek an in-person evaluation if the symptoms worsen or if the condition fails to improve as anticipated.   Christen Butter, MSW, LCSW-A She/Her Behavioral Health Clinician [facility]  Internal Medicine Center Direct Dial:[phone]  Fax [phone] Main Office Phone: [phone] [address] [place] [place], [place] [address] Website: [facility] Internal Medicine [facility]  [place], [place]  [place]

## 2022-08-16 NOTE — Telephone Encounter (Signed)
Alice Castillo Imp Triage Nurse Pool Patient experience severe nightmares. Patient hasn't slept in days. Patient states over counter sleep aids wont work.  Patient expressed having some pain. Didn't specify where. Patient advised tylenol or over counter medications don't work.  Patient has stopped anxiety and depression medications suddenly as of two days ago.  Patient wanted to update PCP. [facility] advised patient to send a Mychart to PCP.      Alice Castillo, Dora Sims, RN  P Imp Blue; P Imp Triage Nurse Pool I called pt - stated she does not want to schedule an appt at this time ; she wanted to inform her doctor of what's going on. Abagail Limb RN

## 2023-05-18 ENCOUNTER — Encounter (HOSPITAL_BASED_OUTPATIENT_CLINIC_OR_DEPARTMENT_OTHER): Payer: Self-pay | Admitting: Emergency Medicine

## 2023-05-18 ENCOUNTER — Other Ambulatory Visit: Payer: Self-pay

## 2023-05-18 ENCOUNTER — Emergency Department (HOSPITAL_BASED_OUTPATIENT_CLINIC_OR_DEPARTMENT_OTHER)
Admission: EM | Admit: 2023-05-18 | Discharge: 2023-05-18 | Disposition: A | Discharge status: Home / Self Care | Payer: MEDICAID | Attending: Emergency Medicine | Admitting: Emergency Medicine

## 2023-05-18 DIAGNOSIS — G43001 Migraine without aura, not intractable, with status migrainosus: Secondary | ICD-10-CM | POA: Diagnosis not present

## 2023-05-18 DIAGNOSIS — R519 Headache, unspecified: Secondary | ICD-10-CM | POA: Diagnosis present

## 2023-05-18 MED ORDER — DEXAMETHASONE SODIUM PHOSPHATE 10 MG/ML IJ SOLN
5.0000 mg | Freq: Once | INTRAMUSCULAR | Status: AC
  Administered 2023-05-18: 5 mg via INTRAVENOUS
  Filled 2023-05-18: qty 1

## 2023-05-18 MED ORDER — PROCHLORPERAZINE MALEATE 10 MG PO TABS: 10.0000 mg | ORAL_TABLET | Freq: Two times a day (BID) | ORAL | 0 refills | Status: DC | PRN

## 2023-05-18 MED ORDER — KETOROLAC TROMETHAMINE 30 MG/ML IJ SOLN
30.0000 mg | Freq: Once | INTRAMUSCULAR | Status: AC
  Administered 2023-05-18: 30 mg via INTRAVENOUS
  Filled 2023-05-18: qty 1

## 2023-05-18 MED ORDER — NAPROXEN 375 MG PO TABS: 375.0000 mg | ORAL_TABLET | Freq: Two times a day (BID) | ORAL | 0 refills | Status: DC | PRN

## 2023-05-18 MED ORDER — DIPHENHYDRAMINE HCL 50 MG/ML IJ SOLN
12.5000 mg | Freq: Once | INTRAMUSCULAR | Status: AC
  Administered 2023-05-18: 12.5 mg via INTRAVENOUS
  Filled 2023-05-18: qty 1

## 2023-05-18 MED ORDER — PROCHLORPERAZINE EDISYLATE 10 MG/2ML IJ SOLN
10.0000 mg | Freq: Once | INTRAMUSCULAR | Status: AC
  Administered 2023-05-18: 10 mg via INTRAVENOUS
  Filled 2023-05-18: qty 2

## 2023-05-18 MED ORDER — SODIUM CHLORIDE 0.9 % IV BOLUS
1000.0000 mL | Freq: Once | INTRAVENOUS | Status: AC
  Administered 2023-05-18: 1000 mL via INTRAVENOUS

## 2023-05-18 NOTE — ED Provider Notes (Signed)
 Alice Castillo Provider Note   CSN: [id number] Arrival date & time: 05/18/23  1516     History  Chief Complaint  Patient presents with  . Headache    Alice Castillo is a 28 y.o. female with a pmh of AUB, Partial Factor XI deficiency, and migraine headaches who presents to the ED with a cc of HA. Description of pain: throbbing pain, bilateral in the frontal area. Duration of individual headaches: since she woke up, frequency daily. Associated symptoms: light sensitivity, visual disturbance, and vomiting. Pain relief: unable to obtain relief with OTC meds. Precipitating factors: patient is aware of none. She denies a history of recent head injury.  Prior neurological history: negative for migraine headaches. Neurologic Review of Systems - no TIA or stroke-like symptoms, no amaurosis, diplopia, abnormal speech, unilateral numbness or weakness.       Headache      Home Medications Prior to Admission medications   Medication Sig Start Date End Date Taking? Authorizing Provider  aspirin-acetaminophen-caffeine (EXCEDRIN MIGRAINE) [id number] MG tablet Take 2 tablets by mouth every 6 (six) hours as needed for headache.    [provider]  cyclobenzaprine (FLEXERIL) 10 MG tablet Take 1 tablet (10 mg total) by mouth every 8 (eight) hours as needed for muscle spasms (headache). 12/29/21   Glyn Ade, Scot Jun, PA-C  escitalopram (LEXAPRO) 5 MG tablet Take 1 tablet (5 mg total) by mouth daily. 06/18/22   Adron Bene, MD  iron polysaccharides (NIFEREX) 150 MG capsule Take 1 capsule (150 mg total) by mouth daily. 07/26/22   Johney Maine, MD  rizatriptan (MAXALT) 10 MG tablet Take 1 tablet (10 mg total) by mouth as needed for migraine. May repeat in 2 hours if needed Patient not taking: Reported on 07/19/2022 03/30/22   Glyn Ade, Scot Jun, PA-C  SUMAtriptan (IMITREX) 100 MG tablet Take 1 tablet (100 mg total) by mouth once as needed for  up to 1 dose for migraine. May repeat in 2 hours if headache persists or recurs. Patient not taking: Reported on 03/30/2022 12/29/21   Glyn Ade, Scot Jun, PA-C  topiramate (TOPAMAX) 25 MG tablet Take 3 tablets (75 mg total) by mouth daily. 06/18/22   Adron Bene, MD  tranexamic acid (LYSTEDA) 650 MG TABS tablet Take 2 tablets (1,300 mg total) by mouth 3 (three) times daily. Take during menses for a maximum of five days 06/26/22   Lorriane Shire, MD      Allergies    Lactose intolerance (gi)    Review of Systems   Review of Systems  Neurological:  Positive for headaches.    Physical Exam Updated Vital Signs BP (!) 131/92 (BP Location: Right Arm)   Pulse 87   Temp 97.9 F (36.6 C)   Resp 20   SpO2 99%  Physical Exam Physical Exam  Constitutional: Pt is oriented to person, place, and time. Pt appears well-developed and well-nourished. No distress.  HENT:  Head: Normocephalic and atraumatic.  Mouth/Throat: Oropharynx is clear and moist.  Eyes: Conjunctivae and EOM are normal. Pupils are equal, round, and reactive to light. No scleral icterus.  No horizontal, vertical or rotational nystagmus  Neck: Normal range of motion. Neck supple.  Full active and passive ROM without pain No midline or paraspinal tenderness No nuchal rigidity or meningeal signs  Cardiovascular: Normal rate, regular rhythm and intact distal pulses.   Pulmonary/Chest: Effort normal and breath sounds normal. No respiratory distress. Pt has no wheezes. No rales.  Abdominal: Soft. Bowel sounds are normal. There is no tenderness. There is no rebound and no guarding.  Musculoskeletal: Normal range of motion.  Lymphadenopathy:    No cervical adenopathy.  Neurological: Pt. is alert and oriented to person, place, and time. He has normal reflexes. No cranial nerve deficit.  Exhibits normal muscle tone. Coordination normal.  Mental Status:  Alert, oriented, thought content appropriate. Speech fluent without  evidence of aphasia. Able to follow 2 step commands without difficulty.  Cranial Nerves:  II:  Peripheral visual fields grossly normal, pupils equal, round, reactive to light III,IV, VI: ptosis not present, extra-ocular motions intact bilaterally  V,VII: smile symmetric, facial light touch sensation equal VIII: hearing grossly normal bilaterally  IX,X: midline uvula rise  XI: bilateral shoulder shrug equal and strong XII: midline tongue extension  Motor:  5/5 in upper and lower extremities bilaterally including strong and equal grip strength and dorsiflexion/plantar flexion Sensory: Pinprick and light touch normal in all extremities.  Deep Tendon Reflexes: 2+ and symmetric  Cerebellar: normal finger-to-nose with bilateral upper extremities Gait: normal gait and balance CV: distal pulses palpable throughout   Skin: Skin is warm and dry. No rash noted. Pt is not diaphoretic.  Psychiatric: Pt has a normal mood and affect. Behavior is normal. Judgment and thought content normal.  Nursing note and vitals reviewed.  ED Results / Procedures / Treatments   Labs (all labs ordered are listed, but only abnormal results are displayed) Labs Reviewed - No data to display  EKG None  Radiology No results found.  Procedures Procedures    Medications Ordered in ED Medications - No data to display  ED Course/ Medical Decision Making/ A&P Clinical Course as of 05/18/23 1812  Sat May 18, 2023  1801 Patient reevlauated- HA down to a 4 from an 8 [AH]    Clinical Course User Index [AH] Arthor Captain, PA-C                                 Medical Decision Making Risk Prescription drug management.    [name] presents with headache Given the large differential diagnosis for [facility], the decision making in this case is of high complexity.  After evaluating all of the data points in this case, the presentation of Fritzi Scripter is NOT consistent with skull fracture,  meningitis/encephalitis, SAH/sentinel bleed, Intracranial Hemorrhage (ICH) (subdural/epidural), acute obstructive hydrocephalus, space occupying lesions, CVA, CO Poisoning, Basilar/vertebral artery dissection, preeclampsia, cerebral venous thrombosis, hypertensive emergency, temporal Arteritis, Idiopathic Intracranial Hypertension (pseudotumor cerebri).  Strict return and follow-up precautions have been given by me personally or by detailed written instructions verbalized by nursing staff using the teach back method to patient/family/caregiver.  Data Reviewed/Counseling: I have reviewed the patient's vital signs, nursing notes, and other relevant tests/information. I had a detailed discussion regarding the historical points, exam findings, and any diagnostic results supporting the discharge diagnosis. I also discussed the need for outpatient follow-up and the need to return to the ED if symptoms worsen or if there are any questions or concerns that arise at [facility]          Final Clinical Impression(s) / ED Diagnoses Final diagnoses:  None    Rx / DC Orders ED Discharge Orders     None         Arthor Captain, PA-C 05/18/23 1812    Rexford Maus, DO 05/18/23 [phone]

## 2023-05-18 NOTE — Discharge Instructions (Signed)
Follow up closely with your neurologist.

## 2023-05-18 NOTE — ED Triage Notes (Signed)
 Pt reports headache and blurry vision that started upon waking this morning. Pt also reports that her hears feel "stuffy." Takes medications for migraines but reports that is not helping.

## 2023-05-22 ENCOUNTER — Other Ambulatory Visit: Payer: Self-pay

## 2023-05-22 ENCOUNTER — Encounter (HOSPITAL_BASED_OUTPATIENT_CLINIC_OR_DEPARTMENT_OTHER): Payer: Self-pay

## 2023-05-22 ENCOUNTER — Emergency Department (HOSPITAL_BASED_OUTPATIENT_CLINIC_OR_DEPARTMENT_OTHER): Payer: MEDICAID

## 2023-05-22 ENCOUNTER — Emergency Department (HOSPITAL_BASED_OUTPATIENT_CLINIC_OR_DEPARTMENT_OTHER)
Admission: EM | Admit: 2023-05-22 | Discharge: 2023-05-22 | Disposition: A | Discharge status: Home / Self Care | Payer: MEDICAID | Attending: Emergency Medicine | Admitting: Emergency Medicine

## 2023-05-22 ENCOUNTER — Emergency Department (HOSPITAL_BASED_OUTPATIENT_CLINIC_OR_DEPARTMENT_OTHER): Payer: MEDICAID | Admitting: Radiology

## 2023-05-22 DIAGNOSIS — Y92410 Unspecified street and highway as the place of occurrence of the external cause: Secondary | ICD-10-CM | POA: Insufficient documentation

## 2023-05-22 DIAGNOSIS — M542 Cervicalgia: Secondary | ICD-10-CM | POA: Insufficient documentation

## 2023-05-22 DIAGNOSIS — R519 Headache, unspecified: Secondary | ICD-10-CM | POA: Insufficient documentation

## 2023-05-22 DIAGNOSIS — M7918 Myalgia, other site: Secondary | ICD-10-CM

## 2023-05-22 MED ORDER — HYDROCODONE-ACETAMINOPHEN 5-325 MG PO TABS: 1.0000 | ORAL_TABLET | ORAL | 0 refills | Status: DC | PRN

## 2023-05-22 MED ORDER — METHOCARBAMOL 750 MG PO TABS: 750.0000 mg | ORAL_TABLET | Freq: Four times a day (QID) | ORAL | 0 refills | Status: DC
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
## 2023-05-22 NOTE — Discharge Instructions (Addendum)
 Warm compresses to the sore areas for added relief. Take medications as prescribed.   Return to the ED with any new or concerning symptoms at any time.
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
## 2023-06-24 ENCOUNTER — Other Ambulatory Visit (HOSPITAL_COMMUNITY)
Admission: RE | Admit: 2023-06-24 | Discharge: 2023-06-24 | Disposition: A | Discharge status: Home / Self Care | Payer: MEDICAID | Source: Ambulatory Visit | Attending: Medical | Admitting: Medical

## 2023-06-24 ENCOUNTER — Encounter: Payer: Self-pay | Admitting: Medical

## 2023-06-24 ENCOUNTER — Ambulatory Visit (INDEPENDENT_AMBULATORY_CARE_PROVIDER_SITE_OTHER): Payer: Medicaid Other | Admitting: Medical

## 2023-06-24 ENCOUNTER — Other Ambulatory Visit: Payer: Self-pay

## 2023-06-24 VITALS — BP 123/81 | HR 80 | Wt 144.5 lb

## 2023-06-24 DIAGNOSIS — Z01419 Encounter for gynecological examination (general) (routine) without abnormal findings: Secondary | ICD-10-CM | POA: Diagnosis present

## 2023-06-24 DIAGNOSIS — Z124 Encounter for screening for malignant neoplasm of cervix: Secondary | ICD-10-CM

## 2023-06-24 DIAGNOSIS — Z113 Encounter for screening for infections with a predominantly sexual mode of transmission: Secondary | ICD-10-CM | POA: Insufficient documentation

## 2023-06-24 NOTE — Progress Notes (Signed)
 History:  Alice Castillo is a 28 y.o. [phone] who presents to clinic today for annual exam. Last pap smear was 2021 and normal. Patient is currently sexually active and using IUD for birth control. Periods are regular lasting 7 days. LMP 4/26. She denies discharge, UTI symptoms, GI issues or breast concerns today. She has no GYN concerns.   The following portions of the patient's history were reviewed and updated as appropriate: allergies, current medications, family history, past medical history, social history, past surgical history and problem list.  Review of Systems:  Review of Systems  Constitutional:  Negative for fever and malaise/fatigue.  Gastrointestinal:  Negative for abdominal pain, constipation, diarrhea, nausea and vomiting.  Genitourinary:  Negative for dysuria, frequency and urgency.       Neg - discharge, pelvic pain + vaginal bleeding       Objective:  Physical Exam BP 123/81   Pulse 80   Wt 144 lb 8 oz (65.5 kg)   LMP 06/22/2023 (Within Days)   BMI 23.32 kg/m  Physical Exam Vitals and nursing note reviewed. Exam conducted with a chaperone present.  Constitutional:      General: She is not in acute distress.    Appearance: Normal appearance. She is well-developed and normal weight.  HENT:     Head: Normocephalic and atraumatic.  Neck:     Thyroid: No thyromegaly.  Cardiovascular:     Rate and Rhythm: Normal rate and regular rhythm.     Heart sounds: No murmur heard. Pulmonary:     Effort: Pulmonary effort is normal. No respiratory distress.     Breath sounds: Normal breath sounds. No wheezing.  Chest:  Breasts:    Right: Normal.     Left: Normal.  Abdominal:     General: Abdomen is flat. Bowel sounds are normal. There is no distension.     Palpations: Abdomen is soft. There is no mass.     Tenderness: There is no abdominal tenderness. There is no guarding or rebound.  Genitourinary:    General: Normal vulva.     Vagina: Bleeding (scant)  present. No vaginal discharge, erythema or tenderness.     Cervix: No cervical motion tenderness, discharge, friability, lesion, erythema or cervical bleeding.     Uterus: Not enlarged and not tender.      Adnexa:        Right: No mass or tenderness.         Left: No mass or tenderness.    Musculoskeletal:     Cervical back: Neck supple.  Skin:    General: Skin is warm and dry.     Findings: No erythema.  Neurological:     Mental Status: She is alert and oriented to person, place, and time.  Psychiatric:        Mood and Affect: Mood normal.     Health Maintenance Due  Topic Date Due   COVID-19 Vaccine (1 - 2024-25 season) Never done   Cervical Cancer Screening (HPV/Pap Cotest)  06/14/2023    Labs, imaging and previous visits in Epic and Care Everywhere reviewed  Assessment & Plan:  1. Cervical cancer screening (Primary) - Cytology - PAP( [facility]) - Pap includes STD testing  - HIV, RPR, Hep B and Hep C declined  - Results will be available in MyChart   2. Encounter for annual routine gynecological examination  3. Depression and anxiety - elevated PHQ9 and GAD7 today, declines referral, denies SI  4. AUB - consider  hormonal IUD, information given on AVS, patient will contact office if she is interested in switching    Return in about 1 year (around 06/23/2024) for Annual exam.  Millard Alliance, PA-C 06/24/2023 9:34 AM

## 2023-06-26 LAB — CYTOLOGY - PAP
Chlamydia: NEGATIVE
Comment: NEGATIVE
Comment: NEGATIVE
Comment: NEGATIVE
Comment: NORMAL
Diagnosis: UNDETERMINED — AB
High risk HPV: NEGATIVE
Neisseria Gonorrhea: NEGATIVE
Trichomonas: NEGATIVE

## 2023-07-02 ENCOUNTER — Other Ambulatory Visit: Payer: Self-pay

## 2023-07-02 DIAGNOSIS — N760 Acute vaginitis: Secondary | ICD-10-CM

## 2023-07-02 MED ORDER — METRONIDAZOLE 500 MG PO TABS: 500.0000 mg | ORAL_TABLET | Freq: Two times a day (BID) | ORAL | 0 refills | Status: AC

## 2023-09-04 ENCOUNTER — Ambulatory Visit (INDEPENDENT_AMBULATORY_CARE_PROVIDER_SITE_OTHER): Payer: Self-pay

## 2023-09-04 ENCOUNTER — Other Ambulatory Visit: Payer: Self-pay

## 2023-09-04 ENCOUNTER — Other Ambulatory Visit (HOSPITAL_COMMUNITY)
Admission: RE | Admit: 2023-09-04 | Discharge: 2023-09-04 | Disposition: A | Discharge status: Home / Self Care | Source: Ambulatory Visit | Attending: Family Medicine | Admitting: Family Medicine

## 2023-09-04 VITALS — BP 108/77 | Ht 66.0 in | Wt 143.6 lb

## 2023-09-04 DIAGNOSIS — N898 Other specified noninflammatory disorders of vagina: Secondary | ICD-10-CM | POA: Insufficient documentation

## 2023-09-05 LAB — CERVICOVAGINAL ANCILLARY ONLY
Bacterial Vaginitis (gardnerella): POSITIVE — AB
Candida Glabrata: NEGATIVE
Candida Vaginitis: NEGATIVE
Chlamydia: NEGATIVE
Comment: NEGATIVE
Comment: NEGATIVE
Comment: NEGATIVE
Comment: NEGATIVE
Comment: NEGATIVE
Comment: NORMAL
Neisseria Gonorrhea: NEGATIVE
Trichomonas: NEGATIVE

## 2023-09-05 NOTE — Progress Notes (Signed)
 Pt here today with c/o vaginal odor.  Pt stated that it seems like it is the same odor as before when she had BV.  Pt explained how to obtain self swab and that we will call with abnormal results.  Pt verbalized understanding with no further questions.   Dayne Chait, RN

## 2023-09-10 ENCOUNTER — Ambulatory Visit: Payer: Self-pay | Admitting: Family Medicine

## 2023-09-10 DIAGNOSIS — N760 Acute vaginitis: Secondary | ICD-10-CM

## 2023-09-10 MED ORDER — METRONIDAZOLE 500 MG PO TABS: 500.0000 mg | ORAL_TABLET | Freq: Two times a day (BID) | ORAL | 0 refills | Status: DC

## 2023-09-11 NOTE — Telephone Encounter (Addendum)
-----   Message from Suzen Maryan Masters sent at 09/10/2023  5:18 PM EDT ----- BV present. Metronidazole  sent to pharmacy ----- Message ----- From: Interface, Lab In [facility] Sent: 09/05/2023   2:32 PM EDT To: Suzen Maryan Masters, MD  Attempted to contact unable to leave voicemail as it is full.  Per chart review, pt received message from MyChart from the provider.   Letizia Hook,RN

## 2024-03-24 ENCOUNTER — Other Ambulatory Visit: Payer: Self-pay

## 2024-03-24 ENCOUNTER — Encounter (HOSPITAL_BASED_OUTPATIENT_CLINIC_OR_DEPARTMENT_OTHER): Payer: Self-pay

## 2024-03-24 ENCOUNTER — Emergency Department (HOSPITAL_BASED_OUTPATIENT_CLINIC_OR_DEPARTMENT_OTHER): Payer: Self-pay

## 2024-03-24 ENCOUNTER — Inpatient Hospital Stay (HOSPITAL_BASED_OUTPATIENT_CLINIC_OR_DEPARTMENT_OTHER)
Admission: EM | Admit: 2024-03-24 | Discharge: 2024-03-26 | DRG: 605 | Disposition: A | Discharge status: Home / Self Care | Payer: Self-pay | Attending: Family Medicine | Admitting: Family Medicine

## 2024-03-24 DIAGNOSIS — Z8249 Family history of ischemic heart disease and other diseases of the circulatory system: Secondary | ICD-10-CM

## 2024-03-24 DIAGNOSIS — S3011XA Contusion of abdominal wall, initial encounter: Principal | ICD-10-CM | POA: Diagnosis present

## 2024-03-24 DIAGNOSIS — Z833 Family history of diabetes mellitus: Secondary | ICD-10-CM

## 2024-03-24 DIAGNOSIS — Y9389 Activity, other specified: Secondary | ICD-10-CM

## 2024-03-24 DIAGNOSIS — D681 Hereditary factor XI deficiency: Secondary | ICD-10-CM | POA: Diagnosis present

## 2024-03-24 LAB — CBC
HCT: 34.4 % — ABNORMAL LOW (ref 36.0–46.0)
HCT: 35.3 % — ABNORMAL LOW (ref 36.0–46.0)
HCT: 40.3 % (ref 36.0–46.0)
Hemoglobin: 11.3 g/dL — ABNORMAL LOW (ref 12.0–15.0)
Hemoglobin: 11.7 g/dL — ABNORMAL LOW (ref 12.0–15.0)
Hemoglobin: 13.7 g/dL (ref 12.0–15.0)
MCH: 29.8 pg (ref 26.0–34.0)
MCH: 29.9 pg (ref 26.0–34.0)
MCH: 30 pg (ref 26.0–34.0)
MCHC: 32.8 g/dL (ref 30.0–36.0)
MCHC: 33.1 g/dL (ref 30.0–36.0)
MCHC: 34 g/dL (ref 30.0–36.0)
MCV: 88.4 fL (ref 80.0–100.0)
MCV: 90.3 fL (ref 80.0–100.0)
MCV: 90.8 fL (ref 80.0–100.0)
Platelets: 188 10*3/uL (ref 150–400)
Platelets: 197 10*3/uL (ref 150–400)
Platelets: 285 10*3/uL (ref 150–400)
RBC: 3.79 MIL/uL — ABNORMAL LOW (ref 3.87–5.11)
RBC: 3.91 MIL/uL (ref 3.87–5.11)
RBC: 4.56 MIL/uL (ref 3.87–5.11)
RDW: 11.9 % (ref 11.5–15.5)
RDW: 11.9 % (ref 11.5–15.5)
RDW: 12 % (ref 11.5–15.5)
WBC: 5.5 10*3/uL (ref 4.0–10.5)
WBC: 5.9 10*3/uL (ref 4.0–10.5)
WBC: 7.6 10*3/uL (ref 4.0–10.5)
nRBC: 0 % (ref 0.0–0.2)
nRBC: 0 % (ref 0.0–0.2)
nRBC: 0 % (ref 0.0–0.2)

## 2024-03-24 LAB — COMPREHENSIVE METABOLIC PANEL WITH GFR
ALT: 52 U/L — ABNORMAL HIGH (ref 0–44)
AST: 133 U/L — ABNORMAL HIGH (ref 15–41)
Albumin: 4.8 g/dL (ref 3.5–5.0)
Alkaline Phosphatase: 82 U/L (ref 38–126)
Anion gap: 12 (ref 5–15)
BUN: 7 mg/dL (ref 6–20)
CO2: 27 mmol/L (ref 22–32)
Calcium: 9.9 mg/dL (ref 8.9–10.3)
Chloride: 105 mmol/L (ref 98–111)
Creatinine, Ser: 0.7 mg/dL (ref 0.44–1.00)
GFR, Estimated: >60 mL/min
Glucose, Bld: 98 mg/dL (ref 70–99)
Potassium: 3.6 mmol/L (ref 3.5–5.1)
Sodium: 144 mmol/L (ref 135–145)
Total Bilirubin: 0.4 mg/dL (ref 0.0–1.2)
Total Protein: 7.7 g/dL (ref 6.5–8.1)

## 2024-03-24 LAB — URINALYSIS, ROUTINE W REFLEX MICROSCOPIC
Bacteria, UA: NONE SEEN
Bilirubin Urine: NEGATIVE
Glucose, UA: NEGATIVE mg/dL
Ketones, ur: NEGATIVE mg/dL
Leukocytes,Ua: NEGATIVE
Nitrite: NEGATIVE
Protein, ur: NEGATIVE mg/dL
Specific Gravity, Urine: 1.043 — ABNORMAL HIGH (ref 1.005–1.030)
pH: 7 (ref 5.0–8.0)

## 2024-03-24 LAB — HEMOGLOBIN AND HEMATOCRIT, BLOOD
HCT: 35.5 % — ABNORMAL LOW (ref 36.0–46.0)
HCT: 35 % — ABNORMAL LOW (ref 36.0–46.0)
Hemoglobin: 12.1 g/dL (ref 12.0–15.0)
Hemoglobin: 11.8 g/dL — ABNORMAL LOW (ref 12.0–15.0)

## 2024-03-24 LAB — TYPE AND SCREEN
ABO/RH(D): O NEG
Antibody Screen: NEGATIVE

## 2024-03-24 LAB — LIPASE, BLOOD: Lipase: 28 U/L (ref 11–51)

## 2024-03-24 LAB — PROTIME-INR
INR: 1.5 — ABNORMAL HIGH (ref 0.8–1.2)
Prothrombin Time: 18.7 s — ABNORMAL HIGH (ref 11.4–15.2)

## 2024-03-24 LAB — HCG, SERUM, QUALITATIVE: Preg, Serum: NEGATIVE

## 2024-03-24 MED ORDER — FENTANYL CITRATE (PF) 50 MCG/ML IJ SOSY
50.0000 ug | PREFILLED_SYRINGE | INTRAMUSCULAR | Status: DC | PRN
  Administered 2024-03-24 (×2): 50 ug via INTRAVENOUS
  Filled 2024-03-24 (×2): qty 1

## 2024-03-24 MED ORDER — LACTATED RINGERS IV SOLN: INTRAVENOUS | Status: DC

## 2024-03-24 MED ORDER — ONDANSETRON HCL 4 MG/2ML IJ SOLN
4.0000 mg | Freq: Three times a day (TID) | INTRAMUSCULAR | Status: AC | PRN
  Administered 2024-03-24 (×2): 4 mg via INTRAVENOUS
  Filled 2024-03-24 (×2): qty 2

## 2024-03-24 MED ORDER — ACETAMINOPHEN 325 MG PO TABS: 650.0000 mg | ORAL_TABLET | Freq: Four times a day (QID) | ORAL | Status: DC | PRN

## 2024-03-24 MED ORDER — ACETAMINOPHEN 650 MG RE SUPP: 650.0000 mg | Freq: Four times a day (QID) | RECTAL | Status: DC | PRN

## 2024-03-24 MED ORDER — IOHEXOL 300 MG/ML  SOLN
100.0000 mL | Freq: Once | INTRAMUSCULAR | Status: AC | PRN
  Administered 2024-03-24: 100 mL via INTRAVENOUS

## 2024-03-24 MED ORDER — SODIUM CHLORIDE 0.9 % IV BOLUS
1000.0000 mL | Freq: Once | INTRAVENOUS | Status: AC
  Administered 2024-03-24: 1000 mL via INTRAVENOUS

## 2024-03-24 MED ORDER — FENTANYL CITRATE (PF) 50 MCG/ML IJ SOSY
50.0000 ug | PREFILLED_SYRINGE | Freq: Once | INTRAMUSCULAR | Status: AC
  Administered 2024-03-24: 50 ug via INTRAVENOUS
  Filled 2024-03-24: qty 1

## 2024-03-24 MED ORDER — ONDANSETRON HCL 4 MG PO TABS: 4.0000 mg | ORAL_TABLET | Freq: Four times a day (QID) | ORAL | Status: DC | PRN

## 2024-03-24 MED ORDER — SENNOSIDES-DOCUSATE SODIUM 8.6-50 MG PO TABS: 1.0000 | ORAL_TABLET | Freq: Every evening | ORAL | Status: DC | PRN

## 2024-03-24 MED ORDER — ONDANSETRON HCL 4 MG/2ML IJ SOLN: 4.0000 mg | Freq: Four times a day (QID) | INTRAMUSCULAR | Status: DC | PRN

## 2024-03-24 MED ORDER — OXYCODONE HCL 5 MG PO TABS
5.0000 mg | ORAL_TABLET | Freq: Four times a day (QID) | ORAL | Status: DC | PRN
  Administered 2024-03-24: 5 mg via ORAL
  Filled 2024-03-24: qty 1

## 2024-03-24 MED ORDER — HYDROMORPHONE HCL 1 MG/ML IJ SOLN
1.0000 mg | Freq: Once | INTRAMUSCULAR | Status: AC
  Administered 2024-03-24: 1 mg via INTRAVENOUS
  Filled 2024-03-24: qty 1

## 2024-03-24 MED ORDER — LACTATED RINGERS IV BOLUS
250.0000 mL | Freq: Once | INTRAVENOUS | Status: AC
  Administered 2024-03-24: 250 mL via INTRAVENOUS

## 2024-03-24 MED ORDER — POLYSACCHARIDE IRON COMPLEX 150 MG PO CAPS
150.0000 mg | ORAL_CAPSULE | Freq: Every day | ORAL | Status: DC
  Administered 2024-03-24 – 2024-03-26 (×3): 150 mg via ORAL
  Filled 2024-03-24 (×3): qty 1

## 2024-03-24 MED ORDER — TRANEXAMIC ACID-NACL 1000-0.7 MG/100ML-% IV SOLN
1000.0000 mg | Freq: Once | INTRAVENOUS | Status: AC
  Administered 2024-03-24: 1000 mg via INTRAVENOUS
  Filled 2024-03-24: qty 100

## 2024-03-24 NOTE — ED Notes (Signed)
 Called Nataya at [facility] for transport at 11:28-TC

## 2024-03-24 NOTE — Plan of Care (Signed)

## 2024-03-24 NOTE — ED Notes (Signed)
 Pt just vomited, giving prn zofran .

## 2024-03-24 NOTE — ED Notes (Signed)
Patient notified of room number for admission.

## 2024-03-24 NOTE — ED Provider Notes (Signed)
 " [place] EMERGENCY DEPARTMENT AT [facility] Provider Note   CSN: [id number] Arrival date & time: 03/24/24  9985     Patient presents with: Abdominal Pain   Alice Castillo is a 29 y.o. female.    Abdominal Pain    29 year old female with medical history significant for factor XI deficiency, PTSD, generalized anxiety disorder, bipolar disorder presenting to the emergency department with right lower quadrant abdominal pain.  The patient states that she has had roughly 24 hours of right lower quadrant pain.  Pain came on suddenly and was sharp and severe.  She denies any nausea or vomiting.  Pain has been constant and progressively worsening.  She denies any diarrhea.  Her last bowel movement was yesterday was normal.  Her last menstrual period ended a few days ago.  She denies any dysuria or frequency, denies any abnormal vaginal discharge.  Prior to Admission medications  Medication Sig Start Date End Date Taking? Authorizing Provider  iron  polysaccharides (NIFEREX) 150 MG capsule Take 1 capsule (150 mg total) by mouth daily. 07/26/22   Onesimo Emaline Brink, MD  metroNIDAZOLE  (FLAGYL ) 500 MG tablet Take 1 tablet (500 mg total) by mouth 2 (two) times daily. 09/10/23   Eldonna Suzen Octave, MD    Allergies: Lactose intolerance (gi)    Review of Systems  Gastrointestinal:  Positive for abdominal pain.  All other systems reviewed and are negative.   Updated Vital Signs BP (!) 147/107   Pulse (!) 104   Temp 98.2 F (36.8 C) (Oral)   Resp (!) 22   Ht 5' 6 (1.676 m)   Wt 65.8 kg   SpO2 100%   BMI 23.40 kg/m   Physical Exam Vitals and nursing note reviewed.  Constitutional:      General: She is not in acute distress.    Appearance: She is well-developed.  HENT:     Head: Normocephalic and atraumatic.  Eyes:     Conjunctiva/sclera: Conjunctivae normal.  Cardiovascular:     Rate and Rhythm: Normal rate and regular rhythm.     Heart sounds: No murmur  heard. Pulmonary:     Effort: Pulmonary effort is normal. No respiratory distress.     Breath sounds: Normal breath sounds.  Abdominal:     Palpations: Abdomen is soft.     Tenderness: There is abdominal tenderness in the right lower quadrant. There is guarding. Positive signs include Rovsing's sign and McBurney's sign.  Musculoskeletal:        General: No swelling.     Cervical back: Neck supple.  Skin:    General: Skin is warm and dry.     Capillary Refill: Capillary refill takes less than 2 seconds.  Neurological:     Mental Status: She is alert.  Psychiatric:        Mood and Affect: Mood normal.     (all labs ordered are listed, but only abnormal results are displayed) Labs Reviewed  COMPREHENSIVE METABOLIC PANEL WITH GFR - Abnormal; Notable for the following components:      Result Value   AST 133 (*)    ALT 52 (*)    All other components within normal limits  LIPASE, BLOOD  CBC  HCG, SERUM, QUALITATIVE  URINALYSIS, ROUTINE W REFLEX MICROSCOPIC    EKG: None  Radiology: CT ABDOMEN PELVIS W CONTRAST Result Date: 03/24/2024 EXAM: CT ABDOMEN AND PELVIS WITH CONTRAST 03/24/2024 01:40:53 AM TECHNIQUE: CT of the abdomen and pelvis was performed with the administration of 100 mL  of iohexol  (OMNIPAQUE ) 300 MG/ML solution. Multiplanar reformatted images are provided for review. Automated exposure control, iterative reconstruction, and/or weight-based adjustment of the mA/kV was utilized to reduce the radiation dose to as low as reasonably achievable. COMPARISON: None available. CLINICAL HISTORY: RLQ abdominal pain. Right lower quadrant abdominal pain. FINDINGS: LOWER CHEST: No acute abnormality. LIVER: The liver is unremarkable. GALLBLADDER AND BILE DUCTS: Gallbladder is unremarkable. No biliary ductal dilatation. SPLEEN: No acute abnormality. PANCREAS: No acute abnormality. ADRENAL GLANDS: No acute abnormality. KIDNEYS, URETERS AND BLADDER: No stones in the kidneys or ureters. No  hydronephrosis. No perinephric or periureteral stranding. Urinary bladder is unremarkable. GI AND BOWEL: Stomach demonstrates no acute abnormality. No small or large bowel thickening or dilatation. There is no bowel obstruction. APPENDIX: The appendix is unremarkable. PERITONEUM AND RETROPERITONEUM: No ascites. No free air. VASCULATURE: Aorta is normal in caliber. Unable to evaluate for active extravasation of the single-phase study. LYMPH NODES: No lymphadenopathy. REPRODUCTIVE ORGANS: T-shaped intrauterine device in appropriate position. The uterus is otherwise unremarkable. No adnexal mass. BONES AND SOFT TISSUES: A right 7.8 x 4.5 x 14 cm rectus abdominis hematoma. No acute osseous abnormality. IMPRESSION: 1. Right 7.8 x 4.5 x 14 cm rectus abdominis hematoma, which may account for symptoms, with inability to evaluate for active extravasation on this single-phase study. 2. No acute intra-abdominal or intrapelvic abnormality. Electronically signed by: Morgane Naveau MD 03/24/2024 01:45 AM EST RP Workstation: [id number]     Procedures   Medications Ordered in the ED  ondansetron  (ZOFRAN ) injection 4 mg (4 mg Intravenous Given 03/24/24 0031)  fentaNYL  (SUBLIMAZE ) injection 50 mcg (50 mcg Intravenous Given 03/24/24 0228)  tranexamic acid  (CYKLOKAPRON ) IVPB 1,000 mg (has no administration in time range)  fentaNYL  (SUBLIMAZE ) injection 50 mcg (50 mcg Intravenous Given 03/24/24 0032)  sodium chloride  0.9 % bolus 1,000 mL (0 mLs Intravenous Stopped 03/24/24 0152)  iohexol  (OMNIPAQUE ) 300 MG/ML solution 100 mL (100 mLs Intravenous Contrast Given 03/24/24 0134)  HYDROmorphone  (DILAUDID ) injection 1 mg (1 mg Intravenous Given 03/24/24 0152)                                    Medical Decision Making Amount and/or Complexity of Data Reviewed Labs: ordered. Radiology: ordered.  Risk Prescription drug management. Decision regarding hospitalization.    29 year old female with medical history significant  for factor XI deficiency, PTSD, generalized anxiety disorder, bipolar disorder presenting to the emergency department with right lower quadrant abdominal pain.  The patient states that she has had roughly 24 hours of right lower quadrant pain.  Pain came on suddenly and was sharp and severe.  She denies any nausea or vomiting.  Pain has been constant and progressively worsening.  She denies any diarrhea.  Her last bowel movement was yesterday was normal.  Her last menstrual period ended a few days ago.  She denies any dysuria or frequency, denies any abnormal vaginal discharge.  Medical Decision Making:   Olina Melfi is a 29 y.o. female who presented to the ED today with abdominal pain, detailed above.     On arrival, the patient was afebrile, not tachycardic, mildly tachypneic RR 22, BP 150/119, saturating 100% on room air.  Complete initial physical exam performed, notably the patient  was tender to palpation in the right lower quadrant, positive Rovsing sign, positive McBurney's point tenderness..     Reviewed and confirmed nursing documentation for past medical history, family history, social  history.    Initial Assessment:   With the patient's presentation of abdominal pain, most likely diagnosis is appendicitis versus ovarian torsion. Other diagnoses were considered including (but not limited to) gastroenteritis, colitis, small bowel obstruction, cholecystitis, pancreatitis, nephrolithiasis, UTI, pyelonephritis, diverticulitis ruptured ectopic pregnancy, PID. These are considered less likely due to history of present illness and physical exam findings.      Initial Plan:  CBC/CMP to evaluate for underlying infectious/metabolic etiology for patient's abdominal pain  Lipase to evaluate for pancreatitis  EKG to evaluate for cardiac source of pain  CTAB/Pelvis with contrast to evaluate for structural/surgical etiology of patients' severe abdominal pain.  Urinalysis and repeat physical  assessment to evaluate for UTI/Pyelonpehritis  Empiric management of symptoms with escalating pain control and antiemetics as needed.   Initial Study Results:   Laboratory  All laboratory results reviewed without evidence of clinically relevant pathology.   Exceptions include: LFTs mildly elevated and alcoholic liver injury pattern, AST 133, ALT 52, lipase normal, hCG negative, CBC unremarkable.  Urinalysis pending.    Radiology CT Abd Pel: IMPRESSION:  1. Right 7.8 x 4.5 x 14 cm rectus abdominis hematoma, which may account for  symptoms, with inability to evaluate for active extravasation on this  single-phase study.  2. No acute intra-abdominal or intrapelvic abnormality.   Discussed with the patient findings on CT imaging.  Patient denies any trauma to her abdomen that she can recall.  She states that symptoms came on suddenly and spontaneously.  Patient remains in severe pain.  Consults: Case discussed with general surgery, Dr. Sebastian, who stated surgical management is not indicated. Recommended pain management and an abdominal binder if available.   Final Reassessment and Plan:   With pharmacy, patient in severe pain, unclear if active extra have in the setting of rectus abdominis hematoma with factor XI deficiency.  No FFP available at this facility, we do have TXA however.  1 g TXA ordered IV.  Will plan to admit the patient for observation in the setting of persistent severe pain.  Hospitalist medicine consulted for admission. Spoke with Dr. Franky, requested CTA to further evaluate and can re-engage after results.   Discussed with on-call for [facility] radiology regarding need for repeat CT.  On further review, it does appear that there is active extravasation.  Will consult interventional radiology for further recommendations on need for embolization versus observation and conservative management.   Addendum: ADDENDUM: I was asked to review these images with Dr. Jerrol  to help decided appropriate patient management. Upon further review, there is a crescentic focus of high attenuation within the rectus sheath hematoma (52/2) compatible with active extravasation. There is a small amount of hemorrhage also seen extending extraperitoneally into the space of Retzius. There is, however, no intraperitoneal extension of hemorrhage. Electronically signed by: Dorethia Molt MD 03/24/2024 03:00 AM EST RP   IR Consult: Spoke with Dr. Karalee who recommended conservative management, agreed with IV TXA, admission to the hospital for observation, abdominal binder, continued monitoring of H/H, will see in consultation in the hospital and can reengage if H&H is dropping.  Hospitalist medicine reengaged for admission, Dr. Franky accepting.     Final diagnoses:  Abdominal wall hematoma, initial encounter  Factor XI deficiency [facility])    ED Discharge Orders     None          Jerrol Agent, MD 03/24/24 0350  "

## 2024-03-24 NOTE — Progress Notes (Addendum)
 Alice Castillo   DOB:05/05/1995   FM#:[id number]      CLINICAL SUMMARY:  Alice Castillo is a 29 year old female patient who presented to ED at [facility] on 03/24/2024 with complaints of lower abdominal pain that started suddenly.  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  Hematology following.   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 - Patient reports sudden onset of right-sided abdominal pain x 2 days.  Denies trauma or fall.  Denies ever having previous hematoma or acute bleeding. - Patient states that she has an 15-year-old and a 73-year-old and did not have any significant bleeding during those 2 vaginal deliveries. - Patient's baseline factor XI levels have been >30%. -CT scan done today shows Right 7.8 x 4.5 x 14 cm rectus abdominis hematoma, - Okay to start Lysteda  1300 mg 3 times daily for 5 days, then 650 mg p.o. 3 times daily x 2 weeks -Recommend FFP only if rapid or significant uncontrolled bleeding -Avoid bleeding triggers like NSAIDs, anticoagulation, meds with antiplatelet activity. - Strict bedrest and keep abdominal binder intact.   - If active extravasation, make IR aware for consideration for angiographic embolization (in that case will need FFP 10 to 15 mL/kg) - Needs repeat PT/PTT, factor XI level   - Hematology/Dr. Onesimo following closely      Code Status Full  Subjective:  Patient seen awake and alert laying in bed.  Abdominal binder is intact.  Patient reports that she went for a walk Saturday evening because she wanted to stretch her legs, bent over and felt severe pain on right side of her abdomen.  This pain was intermittent all day Sunday however she noticed that it was worsening to the point where breathing was painful and she could not sit.  Therefore made  decision to go to the ED.  Continues to have right-sided abdominal pain at this time.  Objective:   Intake/Output Summary (Last 24 hours) at 03/24/2024 1404 Last data filed at 03/24/2024 0152 Gross per 24 hour  Intake 1000 ml  Output --  Net 1000 ml     PHYSICAL EXAMINATION: ECOG PERFORMANCE STATUS: 3 - Symptomatic, >50% confined to bed  Vitals:   03/24/24 1221 03/24/24 1309  BP:  (!) 99/54  Pulse:  62  Resp:  16  Temp: 98 F (36.7 C) 97.6 F (36.4 C)  SpO2:  100%   Filed Weights   03/24/24 0023  Weight: 145 lb (65.8 kg)    GENERAL: alert, no distress and comfortable SKIN: skin color, texture, turgor are normal, no rashes or significant lesions EYES: normal, conjunctiva are pink and non-injected, sclera clear OROPHARYNX: no exudate, no erythema and lips, buccal mucosa, and tongue normal  NECK: supple, thyroid normal size, non-tender, without nodularity LYMPH: no palpable lymphadenopathy in the cervical, axillary or inguinal LUNGS: clear to auscultation and percussion with normal breathing effort HEART: regular rate & rhythm and no murmurs and no lower extremity edema ABDOMEN: abdomen soft, non-tender and normal bowel sounds MUSCULOSKELETAL: no cyanosis of digits and no clubbing  PSYCH: alert & oriented x 3 with fluent speech NEURO: no focal motor/sensory deficits   All questions were answered. The patient knows to call the clinic with any problems, questions or concerns.   I personally spent a total of 40 minutes minutes in the care of the patient today including preparing  to see the patient, getting/reviewing separately obtained history, performing a medically appropriate exam/evaluation, counseling and educating, referring and communicating with other health care professionals, documenting clinical information in the EHR, communicating results, and coordinating care.    Olam PARAS Rouson, NP 03/24/2024 2:04 PM    Labs Reviewed:  Lab Results  Component Value Date    WBC 7.6 03/24/2024   HGB 11.8 (L) 03/24/2024   HCT 35.0 (L) 03/24/2024   MCV 88.4 03/24/2024   PLT 285 03/24/2024   Recent Labs    03/24/24 0026  NA 144  K 3.6  CL 105  CO2 27  GLUCOSE 98  BUN 7  CREATININE 0.70  CALCIUM 9.9  GFRNONAA >60  PROT 7.7  ALBUMIN 4.8  AST 133*  ALT 52*  ALKPHOS 82  BILITOT 0.4    Studies Reviewed:  CT abdomen/pelvis 03/24/2024:  IMPRESSION: 1. Right 7.8 x 4.5 x 14 cm rectus abdominis hematoma, which may account for symptoms, with inability to evaluate for active extravasation on this single-phase study. 2. No acute intra-abdominal or intrapelvic abnormality.    ADDENDUM  .Patient was Personally and independently interviewed, examined and relevant elements of the history of present illness were reviewed in details and an assessment and plan was created. All elements of the patient's history of present illness , assessment and plan were discussed in details with Olam Brunner NP. The above documentation reflects our combined findings assessment and plan.  Pregnancy test neg.  Rectus sheath hematoma, currently clinically stable  Mild Factor XI deficiency - bleeding risk is site- and stress-dependent rather than FXI-level dependent  No evidence at this time for life-threatening hemorrhage  1. Hemostatic management -get baseline FX) level, PT, aPTT -CBC q8hours x 2 days -Tranexamic acid  recommended as first-line therapy: 1300mg  PO TID x 3 days and then 650mg  po TID x 10-12 days - if clinical worsening or rapidly dropping hgb levels -- will rpt CT Abd to reassess size of rectus sheath hematology, IR consultation for consideration of angiographic embolization and FFP replacement @ 10-71ml/kg  2. Supportive measures  Bed rest, abdominal binder Avoid NSAIDs and intramuscular injections Optimize cough control and minimize abdominal strain  Emaline Saran MD MS

## 2024-03-24 NOTE — H&P (Addendum)
 "                                                                                                                                                                                                                                                                                                                                                                         Tele-Hospitalist H&P Note    Patient Demographics:   Alice Castillo, is a 29 y.o. female  MRN: [id number]   DOB - 05-25-1995  Referring Provider: EDP Telemedicine Provider: Lavada Stank M.D   Patient Location: [facility]  Referring Diagnosis:    Chief Complaint  Patient presents with   Abdominal Pain    Patient Name and DOB verified: Alice Castillo,  DOB - 10-09-1995    Patient consented to Telemedicine Evaluation: Yes  RN virtual assistant:  Deena Fell RN  Video encounter time and date: 03/24/2024 at 6:30 AM       HPI:    Alice Castillo  is a 29 y.o. female, with history of factor XI deficiency, also has family history of the same in her dad, who presents to the hospital with 1 day history of lower abdominal pain which started suddenly, patient apparently started going to the gym few days ago and has been doing some abdominal crunches.  Yesterday evening she started having right lower quadrant sharp sudden onset abdominal pain, no associated symptoms, no radiation, no aggravating or relieving factors.  Presented to [facility] ER where CT scan suggested rectus sheath hematoma with some possible active extravasation, IR Dr. Karalee was consulted who requested abdominal binder, CBC monitoring and 23-hour observation.  Patient currently besides abdominal pain as above denies any headache, no fever chills, no chest pain cough phlegm or  palpitations, no diarrhea or dysuria, no blood in stool or urine, no new joint pains or aches, no skin rashes or bruises, no other bleeding sites, no recent injuries,   Review of systems:    A full 10  point Review of Systems was done, except as stated above, all other Review of Systems were negative.   With Past History of the following :    Past Medical History:  Diagnosis Date   Anxiety    Bipolar 1 disorder (HCC)    Chlamydia infection    Depression    Factor XI deficiency (HCC)    Headache    Major depressive disorder, recurrent, severe with psychotic features (HCC) 09/18/2014   Supervision of high risk pregnancy, antepartum 04/13/2019    Nursing Staff Provider Office Location [facility] Dating  LMP c/w 30wk US  Language  english Anatomy US   Normal Flu Vaccine  declined Genetic Screen  NIPS:   AFP:   First Screen:  Quad:   TDaP vaccine  04/13/2019 Hgb A1C or  GTT 2hr GTT normal  Rhogam  04/06/2019   LAB RESULTS  Feeding Plan Breast Blood Type O/Negative/-- (02/15 1414)  Contraception Depo Antibody Positive, See Final Results (02/15 1414      Past Surgical History:  Procedure Laterality Date   NO PAST SURGERIES        Social History:     Social History   Tobacco Use   Smoking status: Never    Passive exposure: Yes   Smokeless tobacco: Never  Substance Use Topics   Alcohol use: Not Currently    Alcohol/week: 1.0 standard drink of alcohol    Types: 1 Shots of liquor per week    Comment: stopped when found out pregnant         Family History :     Family History  Problem Relation Age of Onset   Clotting disorder Father        Factor XI deficiency (affected or carrier?)   Heart disease Maternal Uncle    Diabetes Maternal Uncle    Stroke Maternal Uncle    Heart disease Paternal Uncle    Diabetes Paternal Uncle    Clotting disorder Paternal Uncle        Factor XI (11) deficiency   Clotting disorder Paternal Uncle        Factor XI deficiency   Diabetes Maternal Grandmother    Heart disease Maternal Grandmother    Stroke Maternal Grandmother    Diabetes Maternal Grandfather    Heart disease Maternal Grandfather    Stroke Maternal Grandfather        Home  Medications:   Prior to Admission medications  Medication Sig Start Date End Date Taking? Authorizing Provider  iron  polysaccharides (NIFEREX) 150 MG capsule Take 1 capsule (150 mg total) by mouth daily. 07/26/22  Yes Onesimo Emaline Brink, MD  metroNIDAZOLE  (FLAGYL ) 500 MG tablet Take 1 tablet (500 mg total) by mouth 2 (two) times daily. 09/10/23  Yes Eldonna Suzen Octave, MD     Allergies:    Allergies[1]   Physical Exam: Assisted by patient's RN over the camera   Vitals  Blood pressure 109/80, pulse 65, temperature 98.2 F (36.8 C), temperature source Oral, resp. rate (!) 9, height 5' 6 (1.676 m), weight 65.8 kg, SpO2 98%.   Virtual exam done over Video assisted by RN  1. General Young Caucasian female lying in hospital bed in no apparent distress  2. Normal affect and insight, Not Suicidal or Homicidal,  Awake Alert,   3. No F.N deficits, Strength & Sensation equally intact all 4 extremities,    4. Ears and Eyes appear Normal,   5. Supple Neck,    6. Symmetrical Chest wall movement, Good air movement bilaterally, CTAB.  7. RRR, No Murmurs,   8. Positive Bowel Sounds, Abdomen Soft, No tenderness, abdominal bandage in place, Ace wrap  9. No Skin Rash   10. Normal ROM.     Data Review:   Recent Labs  Lab 03/24/24 0026 03/24/24 0510  WBC 7.6  --   HGB 13.7 12.1  HCT 40.3 35.5*  PLT 285  --   MCV 88.4  --   MCH 30.0  --   MCHC 34.0  --   RDW 11.9  --     Recent Labs  Lab 03/24/24 0026  NA 144  K 3.6  CL 105  CO2 27  ANIONGAP 12  GLUCOSE 98  BUN 7  CREATININE 0.70  AST 133*  ALT 52*  ALKPHOS 82  BILITOT 0.4  ALBUMIN 4.8  CALCIUM 9.9    No results found for: CHOL, HDL, LDLCALC, LDLDIRECT, TRIG, CHOLHDL  Recent Labs  Lab 03/24/24 0026  CALCIUM 9.9    Recent Labs  Lab 03/24/24 0026  WBC 7.6  PLT 285  CREATININE 0.70    Urinalysis    Component Value Date/Time   COLORURINE YELLOW 04/16/2019 1622   APPEARANCEUR HAZY  (A) 04/16/2019 1622   LABSPEC >=1.030 11/22/2021 1440   PHURINE 5.5 11/22/2021 1440   GLUCOSEU NEGATIVE 11/22/2021 1440   HGBUR SMALL (A) 11/22/2021 1440   BILIRUBINUR SMALL (A) 11/22/2021 1440   KETONESUR 15 (A) 11/22/2021 1440   PROTEINUR >=300 (A) 11/22/2021 1440   UROBILINOGEN 0.2 11/22/2021 1440   NITRITE POSITIVE (A) 11/22/2021 1440   LEUKOCYTESUR SMALL (A) 11/22/2021 1440      Imaging Results:    CT ABDOMEN PELVIS W CONTRAST Addendum Date: 03/24/2024  ADDENDUM: I was asked to review these images with Dr. Jerrol to help decided appropriate patient management. Upon further review, there is a crescentic focus of high attenuation within the rectus sheath hematoma (52/2) compatible with active extravasation. There is a small amount of hemorrhage also seen extending extraperitoneally into the space of Retzius. There is, however, no intraperitoneal extension of hemorrhage. Electronically signed by: Dorethia Molt MD 03/24/2024 03:00 AM EST RP Workstation: [id number]   Result Date: 03/24/2024  EXAM: CT ABDOMEN AND PELVIS WITH CONTRAST 03/24/2024 01:40:53 AM TECHNIQUE: CT of the abdomen and pelvis was performed with the administration of 100 mL of iohexol  (OMNIPAQUE ) 300 MG/ML solution. Multiplanar reformatted images are provided for review. Automated exposure control, iterative reconstruction, and/or weight-based adjustment of the mA/kV was utilized to reduce the radiation dose to as low as reasonably achievable. COMPARISON: None available. CLINICAL HISTORY: RLQ abdominal pain. Right lower quadrant abdominal pain. FINDINGS: LOWER CHEST: No acute abnormality. LIVER: The liver is unremarkable. GALLBLADDER AND BILE DUCTS: Gallbladder is unremarkable. No biliary ductal dilatation. SPLEEN: No acute abnormality. PANCREAS: No acute abnormality. ADRENAL GLANDS: No acute abnormality. KIDNEYS, URETERS AND BLADDER: No stones in the kidneys or ureters. No hydronephrosis. No perinephric or periureteral  stranding. Urinary bladder is unremarkable. GI AND BOWEL: Stomach demonstrates no acute abnormality. No small or large bowel thickening or dilatation. There is no bowel obstruction. APPENDIX: The appendix is unremarkable. PERITONEUM AND RETROPERITONEUM: No ascites. No free air. VASCULATURE: Aorta is normal in caliber. Unable to evaluate for active extravasation of the single-phase study. LYMPH  NODES: No lymphadenopathy. REPRODUCTIVE ORGANS: T-shaped intrauterine device in appropriate position. The uterus is otherwise unremarkable. No adnexal mass. BONES AND SOFT TISSUES: A right 7.8 x 4.5 x 14 cm rectus abdominis hematoma. No acute osseous abnormality. IMPRESSION: 1. Right 7.8 x 4.5 x 14 cm rectus abdominis hematoma, which may account for symptoms, with inability to evaluate for active extravasation on this single-phase study. 2. No acute intra-abdominal or intrapelvic abnormality. Electronically signed by: Morgane Naveau MD 03/24/2024 01:45 AM EST RP Workstation: [id number]    My personal review of EKG: Rhythm NSR,  no Acute ST changes   Assessment & Plan:   1.  Abdominal pain.  Evidence of rectus sheath hematoma with small extravasation, in a patient with history of factor XI deficiency.  Likely brought on by recent gym activity specially abdominal crunches.  No other inciting factors or injuries, patient will be kept in the hospital for 23-hour observation, type screen, serial CBC, IR Dr.  Karalee was consulted who requested abdominal binder for now, if bleeding continues then possible embolization.  Patient agreeable for transfusion if needed, will monitor CBC, INR, also requested Dr. Lanny oncologist/hematologist to see the patient, question if she requires any FFP replacement etc.  Continue supportive care for abdominal pain and mild narcotic induced nausea.    Transfusion if needed.  Yes   DVT Prophylaxis  SCDs    AM Labs Ordered, also please review Full Orders  Family Communication:  Admission, patients condition and plan of care including tests being ordered have been discussed with the patient who indicates understanding and agree with the plan and Code Status.  Code Status full code  Likely DC to home  Condition fair  Consults called: IR Dr. Karalee, Haem Dr Curlee  Admission status: Observation  Time spent in minutes : 40  Signature  -    Lavada Stank M.D on 03/24/2024 at 6:30 AM   -  To page go to [email or web address]              [1]  Allergies Allergen Reactions   Lactose Intolerance (Gi) Other (See Comments)    Stomach irritation, and diarhea   "

## 2024-03-24 NOTE — ED Triage Notes (Signed)
 Pt reports severe RLQ abdominal pain beginning around 12 hours ago that came on suddenly. Pain has been constant and progressively worsening. Denies urinary symptoms/fever/N/V/D. Pt states her friend reports her passing out last night but patient does not recall episode. No history of the same. Significant distress noted in triage.

## 2024-03-24 NOTE — Progress Notes (Signed)
 HPI reviewed. She is alert, answer questions, pale. . Report abdominal pain.  Nurse has release admission orders. Nursing is placing abdominal binder. Hematologist Dr Onesimo, has been informed of patient arrival to [facility]. Plan to start IV fluids. She has CBC order for 8 PM. Monitor closely. If she continue to bleed, will need to contact IR for evaluation for angiogram and embolization.

## 2024-03-25 ENCOUNTER — Other Ambulatory Visit (HOSPITAL_COMMUNITY): Payer: Self-pay

## 2024-03-25 LAB — COMPREHENSIVE METABOLIC PANEL WITH GFR
ALT: 38 U/L (ref 0–44)
AST: 68 U/L — ABNORMAL HIGH (ref 15–41)
Albumin: 3.7 g/dL (ref 3.5–5.0)
Alkaline Phosphatase: 71 U/L (ref 38–126)
Anion gap: 6 (ref 5–15)
BUN: 7 mg/dL (ref 6–20)
CO2: 27 mmol/L (ref 22–32)
Calcium: 8.8 mg/dL — ABNORMAL LOW (ref 8.9–10.3)
Chloride: 104 mmol/L (ref 98–111)
Creatinine, Ser: 0.63 mg/dL (ref 0.44–1.00)
GFR, Estimated: >60 mL/min
Glucose, Bld: 95 mg/dL (ref 70–99)
Potassium: 3.7 mmol/L (ref 3.5–5.1)
Sodium: 137 mmol/L (ref 135–145)
Total Bilirubin: 0.5 mg/dL (ref 0.0–1.2)
Total Protein: 6.1 g/dL — ABNORMAL LOW (ref 6.5–8.1)

## 2024-03-25 LAB — CBC WITH DIFFERENTIAL/PLATELET
Abs Immature Granulocytes: 0.01 10*3/uL (ref 0.00–0.07)
Basophils Absolute: 0 10*3/uL (ref 0.0–0.1)
Basophils Relative: 0 %
Eosinophils Absolute: 0.1 10*3/uL (ref 0.0–0.5)
Eosinophils Relative: 3 %
HCT: 35.7 % — ABNORMAL LOW (ref 36.0–46.0)
Hemoglobin: 12.2 g/dL (ref 12.0–15.0)
Immature Granulocytes: 0 %
Lymphocytes Relative: 26 %
Lymphs Abs: 1.2 10*3/uL (ref 0.7–4.0)
MCH: 30.6 pg (ref 26.0–34.0)
MCHC: 34.2 g/dL (ref 30.0–36.0)
MCV: 89.5 fL (ref 80.0–100.0)
Monocytes Absolute: 0.4 10*3/uL (ref 0.1–1.0)
Monocytes Relative: 8 %
Neutro Abs: 3 10*3/uL (ref 1.7–7.7)
Neutrophils Relative %: 63 %
Platelets: 196 10*3/uL (ref 150–400)
RBC: 3.99 MIL/uL (ref 3.87–5.11)
RDW: 11.8 % (ref 11.5–15.5)
WBC: 4.7 10*3/uL (ref 4.0–10.5)
nRBC: 0 % (ref 0.0–0.2)

## 2024-03-25 LAB — CBC
HCT: 35.3 % — ABNORMAL LOW (ref 36.0–46.0)
Hemoglobin: 11.9 g/dL — ABNORMAL LOW (ref 12.0–15.0)
MCH: 30.1 pg (ref 26.0–34.0)
MCHC: 33.7 g/dL (ref 30.0–36.0)
MCV: 89.4 fL (ref 80.0–100.0)
Platelets: 190 10*3/uL (ref 150–400)
RBC: 3.95 MIL/uL (ref 3.87–5.11)
RDW: 11.8 % (ref 11.5–15.5)
WBC: 5.5 10*3/uL (ref 4.0–10.5)
nRBC: 0 % (ref 0.0–0.2)

## 2024-03-25 LAB — PROTIME-INR
INR: 1.1 (ref 0.8–1.2)
Prothrombin Time: 15.2 s (ref 11.4–15.2)

## 2024-03-25 MED ORDER — TRANEXAMIC ACID 650 MG PO TABS
1300.0000 mg | ORAL_TABLET | Freq: Three times a day (TID) | ORAL | Status: DC
  Administered 2024-03-25 – 2024-03-26 (×3): 1300 mg via ORAL
  Filled 2024-03-25 (×5): qty 2

## 2024-03-25 NOTE — Progress Notes (Signed)
 " PROGRESS NOTE    Alice Castillo  FMW:[id number] DOB: [birth year] DOA: 03/24/2024 PCP: Harrie Bruckner, DO   Brief Narrative:  Alice Castillo  is a 29 y.o. female, with history of factor XI deficiency, also has family history of the same in her dad, who presents to the hospital with 1 day history of lower abdominal pain which started suddenly, patient apparently started going to the gym few days ago and has been doing some abdominal crunches.  Yesterday evening she started having right lower quadrant sharp sudden onset abdominal pain, no associated symptoms, no radiation, no aggravating or relieving factors.  Presented to [facility] where CT scan suggested rectus sheath hematoma with some possible active extravasation, IR Dr. Karalee was consulted who requested abdominal binder, CBC monitoring and 23-hour observation.   Patient currently besides abdominal pain as above denies any headache, no fever chills, no chest pain cough phlegm or palpitations, no diarrhea or dysuria, no blood in stool or urine, no new joint pains or aches, no skin rashes or bruises, no other bleeding sites, no recent injuries,  Assessment & Plan:   Principal Problem:   Rectus sheath hematoma, initial encounter  1.  Abdominal pain.  Evidence of rectus sheath hematoma with small extravasation, in a patient with history of factor XI deficiency.  Likely brought on by recent gym activity specially abdominal crunches.  No other inciting factors or injuries. IR Dr.  Karalee was consulted who requested abdominal binder for now, if bleeding continues then possible embolization.  Patient agreeable for transfusion if needed, will monitor CBC, INR.  Patient was seen by oncology Dr. Onesimo, per his note, patient to be started on lysteda  however I do not see this medication ordered for this patient.  I have sent a message to Dr. Onesimo to clarify.  Also, they recommended strict bedrest and abdominal binder, trying to figure out how  long she needs to follow those recommendations.  Thankfully, her hemoglobin has remained stable and her pain is improving.  Will observe overnight and repeat CBC later today and tomorrow morning.  If stable, may be ready for discharge tomorrow.  Discussed this with the patient.  DVT prophylaxis: SCDs Start: 03/24/24 1337   Code Status: Full Code  Family Communication:  None present at bedside.  Plan of care discussed with patient in length and he/she verbalized understanding and agreed with it.  Status is: Inpatient Remains inpatient appropriate because: Needs more monitoring.   Estimated body mass index is 23.4 kg/m as calculated from the following:   Height as of this encounter: 5' 6 (1.676 m).   Weight as of this encounter: 65.8 kg.    Nutritional Assessment: Body mass index is 23.4 kg/m.SABRA Seen by dietician.  I agree with the assessment and plan as outlined below: Nutrition Status:        . Skin Assessment: I have examined the patient's skin and I agree with the wound assessment as performed by the wound care RN as outlined below:    Consultants:  Oncology and IR  Procedures:  None  Antimicrobials:  Anti-infectives (From admission, onward)    None         Subjective: Patient seen and examined, she says that her abdominal pain is much better, down to 2-3 out of 10 at right lower quadrant.  No other complaint.  Objective: Vitals:   03/24/24 1940 03/24/24 2051 03/24/24 2342 03/25/24 0829  BP: 106/68 106/74 124/76 (!) 97/56  Pulse: 68 80 77 65  Resp:  17 17 17 17   Temp: 98.4 F (36.9 C) 98.1 F (36.7 C) 98.6 F (37 C) 98.1 F (36.7 C)  TempSrc: Oral Oral Oral Oral  SpO2: 99% 99% 99% 100%  Weight:      Height:       No intake or output data in the 24 hours ending 03/25/24 1137 Filed Weights   03/24/24 0023  Weight: 65.8 kg    Examination:  General exam: Appears calm and comfortable  Respiratory system: Clear to auscultation. Respiratory effort  normal. Cardiovascular system: S1 & S2 heard, RRR. No JVD, murmurs, rubs, gallops or clicks. No pedal edema. Gastrointestinal system: Abdomen is nondistended, soft, has abdominal binder, right lower quadrant tenderness.  No bruise.  No organomegaly or masses felt. Normal bowel sounds heard. Central nervous system: Alert and oriented. No focal neurological deficits. Extremities: Symmetric 5 x 5 power. Skin: No rashes, lesions or ulcers Psychiatry: Judgement and insight appear normal. Mood & affect appropriate.    Data Reviewed: I have personally reviewed following labs and imaging studies  CBC: Recent Labs  Lab 03/24/24 0026 03/24/24 0510 03/24/24 0943 03/24/24 1335 03/24/24 2132 03/25/24 0509  WBC 7.6  --   --  5.9 5.5 5.5  HGB 13.7 12.1 11.8* 11.7* 11.3* 11.9*  HCT 40.3 35.5* 35.0* 35.3* 34.4* 35.3*  MCV 88.4  --   --  90.3 90.8 89.4  PLT 285  --   --  197 188 190   Basic Metabolic Panel: Recent Labs  Lab 03/24/24 0026 03/25/24 0509  NA 144 137  K 3.6 3.7  CL 105 104  CO2 27 27  GLUCOSE 98 95  BUN 7 7  CREATININE 0.70 0.63  CALCIUM 9.9 8.8*   GFR: Estimated Creatinine Clearance: 96.3 mL/min (by C-G formula based on SCr of 0.63 mg/dL). Liver Function Tests: Recent Labs  Lab 03/24/24 0026 03/25/24 0509  AST 133* 68*  ALT 52* 38  ALKPHOS 82 71  BILITOT 0.4 0.5  PROT 7.7 6.1*  ALBUMIN 4.8 3.7   Recent Labs  Lab 03/24/24 0026  LIPASE 28   No results for input(s): AMMONIA  in the last 168 hours. Coagulation Profile: Recent Labs  Lab 03/24/24 0700 03/25/24 0509  INR 1.5* 1.1   Cardiac Enzymes: No results for input(s): CKTOTAL, CKMB, CKMBINDEX, TROPONINI in the last 168 hours. BNP (last 3 results) No results for input(s): PROBNP in the last 8760 hours. HbA1C: No results for input(s): HGBA1C in the last 72 hours. CBG: No results for input(s): GLUCAP in the last 168 hours. Lipid Profile: No results for input(s): CHOL, HDL,  LDLCALC, TRIG, CHOLHDL, LDLDIRECT in the last 72 hours. Thyroid Function Tests: No results for input(s): TSH, T4TOTAL, FREET4, T3FREE, THYROIDAB in the last 72 hours. Anemia Panel: No results for input(s): VITAMINB12, FOLATE, FERRITIN, TIBC, IRON , RETICCTPCT in the last 72 hours. Sepsis Labs: No results for input(s): PROCALCITON, LATICACIDVEN in the last 168 hours.  No results found for this or any previous visit (from the past 240 hours).   Radiology Studies:   Scheduled Meds:  iron  polysaccharides  150 mg Oral Daily   Continuous Infusions:  lactated ringers  100 mL/hr at 03/25/24 0930     LOS: 1 day   Fredia Skeeter, MD [facility]  03/25/2024, 11:37 AM   *Please note that this is a verbal dictation therefore any spelling or grammatical errors are due to the Dragon Medical One system interpretation.  Please page via Amion and do not message via secure chat  for urgent patient care matters. Secure chat can be used for non urgent patient care matters.  How to contact the [facility] Attending or Consulting provider 7A - 7P or covering provider during after hours 7P -7A, for this patient?  Check the care team in [facility] and look for a) attending/consulting TRH provider listed and b) the TR[facility] team listed. Page or secure chat 7A-7P. Log into [email or web address] and use [facility]'s universal password to access. If you do not have the password, please contact the hospital operator. Locate the [facility] provider you are looking for under [facility] and page to a number that you can be directly reached. If you still have difficulty reaching the provider, please page the [facility] (Director on Call) for the Hospitalists listed on amion for assistance.  "

## 2024-03-25 NOTE — Plan of Care (Signed)

## 2024-03-26 ENCOUNTER — Other Ambulatory Visit (HOSPITAL_COMMUNITY): Payer: Self-pay

## 2024-03-26 ENCOUNTER — Encounter (HOSPITAL_COMMUNITY): Payer: Self-pay | Admitting: Internal Medicine

## 2024-03-26 LAB — CBC WITH DIFFERENTIAL/PLATELET
Abs Immature Granulocytes: 0.01 10*3/uL (ref 0.00–0.07)
Basophils Absolute: 0 10*3/uL (ref 0.0–0.1)
Basophils Relative: 0 %
Eosinophils Absolute: 0.2 10*3/uL (ref 0.0–0.5)
Eosinophils Relative: 3 %
HCT: 34.8 % — ABNORMAL LOW (ref 36.0–46.0)
Hemoglobin: 12 g/dL (ref 12.0–15.0)
Immature Granulocytes: 0 %
Lymphocytes Relative: 33 %
Lymphs Abs: 1.9 10*3/uL (ref 0.7–4.0)
MCH: 30.7 pg (ref 26.0–34.0)
MCHC: 34.5 g/dL (ref 30.0–36.0)
MCV: 89 fL (ref 80.0–100.0)
Monocytes Absolute: 0.5 10*3/uL (ref 0.1–1.0)
Monocytes Relative: 9 %
Neutro Abs: 3.2 10*3/uL (ref 1.7–7.7)
Neutrophils Relative %: 55 %
Platelets: 205 10*3/uL (ref 150–400)
RBC: 3.91 MIL/uL (ref 3.87–5.11)
RDW: 11.7 % (ref 11.5–15.5)
WBC: 5.8 10*3/uL (ref 4.0–10.5)
nRBC: 0 % (ref 0.0–0.2)

## 2024-03-26 LAB — MISC LABCORP TEST (SEND OUT): Labcorp test code: 83935

## 2024-03-26 MED ORDER — TRANEXAMIC ACID 650 MG PO TABS
ORAL_TABLET | ORAL | 0 refills | Status: AC
  Filled 2024-03-26: qty 66, 18d supply, fill #0

## 2024-03-26 NOTE — Plan of Care (Signed)

## 2024-03-26 NOTE — TOC Initial Note (Addendum)
 Transition of Care [facility]) - Initial/Assessment Note    Patient Details  Name: Alice Castillo MRN: [id number] Date of Birth: March 12, 1995  Transition of Care [facility]) CM/SW Contact:    Charlene Julian Daring, RN Phone Number: 03/26/2024, 12:31 PM  Clinical Narrative:                  rectus sheath hematoma   CM provided MATCH for patient for discharge medications b/c of no insurance and also notified GEANNIE Mosses- [facility] financial counselor to follow up with patient. Patient has PCP follow up. Patient given 25$ gas card and also resources through [employer] to patient for rent resources. Patient also given the It Trainer. Patient appreciative.    Expected Discharge Date: 03/26/24                  Activities of Daily Living   ADL Screening (condition at time of admission) Independently performs ADLs?: Yes (appropriate for developmental age) Is the patient deaf or have difficulty hearing?: No Does the patient have difficulty seeing, even when wearing glasses/contacts?: No Does the patient have difficulty concentrating, remembering, or making decisions?: No  Admission diagnosis:  Factor XI deficiency (HCC) [D68.1] Abdominal wall hematoma, initial encounter [S30.11XA] Rectus sheath hematoma, initial encounter [S30.11XA] Patient Active Problem List   Diagnosis Date Noted   Rectus sheath hematoma, initial encounter 03/24/2024   Menorrhagia 06/18/2022   Encounter for hepatitis C screening test for low risk patient 06/18/2022   Generalized anxiety disorder 09/07/2021   Mild depression 09/07/2021   Pelvic floor dysfunction 08/11/2019   Paresthesias in antepartum period in third trimester 03/22/2015   Factor XI deficiency (HCC) 03/22/2015   PTSD (post-traumatic stress disorder) 09/22/2014   Hallucinations    PCP:  Harrie Bruckner, DO Pharmacy:   [id number] [place], [place] - [address] AT [facility] [address] &  [address] [place] [address] Phone: [phone] Fax: [phone]  [facility] [address] [place] [address] Phone: [phone] Fax: [phone]     Social Drivers of Health (SDOH) Social History: SDOH Screenings   Food Insecurity: Food Insecurity Present (03/24/2024)  Housing: High Risk (03/24/2024)  Transportation Needs: No Transportation Needs (03/24/2024)  Utilities: Not At Risk (03/24/2024)  Depression (PHQ2-9): High Risk (06/24/2023)  Social Connections: Unknown (12/10/2022)   Received from [facility]  Tobacco Use: Medium Risk (03/24/2024)   SDOH Interventions:     Readmission Risk Interventions     No data to display

## 2024-03-26 NOTE — Discharge Summary (Signed)
 Physician Discharge Summary  Lenni Reckner FMW:[id number] DOB: Jun 26, 1995 DOA: 03/24/2024  PCP: Harrie Bruckner, DO  Admit date: 03/24/2024 Discharge date: 03/26/2024 30 Day Unplanned Readmission Risk Score    Flowsheet Row ED to Hosp-Admission (Current) from 03/24/2024 in [facility]  30 Day Unplanned Readmission Risk Score (%) 7.98 Filed at 03/26/2024 0400    This score is the patient's risk of an unplanned readmission within 30 days of being discharged (0 -100%). The score is based on dignosis, age, lab data, medications, orders, and past utilization.   Low:  0-14.9   Medium: 15-21.9   High: 22-29.9   Extreme: 30 and above          Admitted From: Home Disposition: Home  Recommendations for Outpatient Follow-up:  Follow up with PCP in 1-2 weeks Please obtain BMP/CBC in one week Please follow up with your PCP on the following pending results: Unresulted Labs (From admission, onward)    None         Home Health: None Equipment/Devices: None  Discharge Condition: Stable CODE STATUS: Full code Diet recommendation:  Diet Order             Diet regular Room service appropriate? Yes; Fluid consistency: Thin  Diet effective now                   Subjective: Seen and examined, abdominal pain is almost resolved.  No other complaint.  Brief/Interim Summary: Alice Castillo  is a 29 y.o. female, with history of factor XI deficiency, also has family history of the same in her dad, who presents to the hospital with 1 day history of lower abdominal pain which started suddenly, patient apparently started going to the gym few days ago and has been doing some abdominal crunches.  Yesterday evening she started having right lower quadrant sharp sudden onset abdominal pain, no associated symptoms, no radiation, no aggravating or relieving factors.  Presented to [facility] where CT scan suggested rectus sheath hematoma with some possible active extravasation, IR Dr.  Karalee was consulted who requested abdominal binder, CBC monitoring and 23-hour observation.   Patient currently besides abdominal pain as above denies any headache, no fever chills, no chest pain cough phlegm or palpitations, no diarrhea or dysuria, no blood in stool or urine, no new joint pains or aches, no skin rashes or bruises, no other bleeding sites, no recent injuries, details below.   1.  Abdominal pain.  Evidence of rectus sheath hematoma with small extravasation, in a patient with history of factor XI deficiency.  Likely brought on by recent gym activity specially abdominal crunches.  No other inciting factors or injuries. IR Dr.  Karalee was consulted who requested abdominal binder for now, if bleeding continues then possible embolization.  Patient was seen by oncology Dr. Onesimo, patient started on lysteda , Also, they recommended strict bedrest and abdominal binder. Thankfully, her hemoglobin has remained stable and her pain is improving.  She did not require any blood transfusion.  She is stable for discharge.  Per oncology recommendations, discharging on 18 more days of Lysteda  and abdominal binder for 2 weeks.  Oncology will give her a call and see her as outpatient in 2 weeks.  Discharge Diagnoses:  Principal Problem:   Rectus sheath hematoma, initial encounter    Discharge Instructions   Allergies as of 03/26/2024       Reactions   Lactose Intolerance (gi) Other (See Comments)   Stomach irritation, and diarhea  Medication List     STOP taking these medications    metroNIDAZOLE  500 MG tablet Commonly known as: FLAGYL        TAKE these medications    iron  polysaccharides 150 MG capsule Commonly known as: NIFEREX Take 1 capsule (150 mg total) by mouth daily.   tranexamic acid  650 MG Tabs tablet Commonly known as: LYSTEDA  Take 2 tablets (1,300 mg total) by mouth 3 (three) times daily for 4 days, THEN 1 tablet (650 mg total) 3 (three) times daily for  14 days. Start taking on: March 26, 2024        Follow-up Information     Harrie Bruckner, DO Follow up in 1 week(s).   Specialty: Internal Medicine Contact information: [address] [place], [address] [place] [address] [phone]                Allergies[1]  Consultations: Oncology   Procedures/Studies:    Discharge Exam: Vitals:   03/25/24 2006 03/26/24 0326  BP: 108/61 107/60  Pulse: 77 78  Resp: 17 17  Temp: 98.1 F (36.7 C) 98.1 F (36.7 C)  SpO2: 99% 99%   Vitals:   03/25/24 0829 03/25/24 1616 03/25/24 2006 03/26/24 0326  BP: (!) 97/56 110/69 108/61 107/60  Pulse: 65 67 77 78  Resp: 17 16 17 17   Temp: 98.1 F (36.7 C) 98.4 F (36.9 C) 98.1 F (36.7 C) 98.1 F (36.7 C)  TempSrc: Oral Oral Oral Oral  SpO2: 100% 99% 99% 99%  Weight:      Height:        General: Pt is alert, awake, not in acute distress Cardiovascular: RRR, S1/S2 +, no rubs, no gallops Respiratory: CTA bilaterally, no wheezing, no rhonchi Abdominal: Soft, NT, ND, bowel sounds + Extremities: no edema, no cyanosis    The results of significant diagnostics from this hospitalization (including imaging, microbiology, ancillary and laboratory) are listed below for reference.     Microbiology: No results found for this or any previous visit (from the past 240 hours).   Labs: BNP (last 3 results) No results for input(s): BNP in the last 8760 hours. Basic Metabolic Panel: Recent Labs  Lab 03/24/24 0026 03/25/24 0509  NA 144 137  K 3.6 3.7  CL 105 104  CO2 27 27  GLUCOSE 98 95  BUN 7 7  CREATININE 0.70 0.63  CALCIUM 9.9 8.8*   Liver Function Tests: Recent Labs  Lab 03/24/24 0026 03/25/24 0509  AST 133* 68*  ALT 52* 38  ALKPHOS 82 71  BILITOT 0.4 0.5  PROT 7.7 6.1*  ALBUMIN 4.8 3.7   Recent Labs  Lab 03/24/24 0026  LIPASE 28   No results for input(s): AMMONIA  in the last 168 hours. CBC: Recent Labs  Lab 03/24/24 1335 03/24/24 2132  03/25/24 0509 03/25/24 1739 03/26/24 0431  WBC 5.9 5.5 5.5 4.7 5.8  NEUTROABS  --   --   --  3.0 3.2  HGB 11.7* 11.3* 11.9* 12.2 12.0  HCT 35.3* 34.4* 35.3* 35.7* 34.8*  MCV 90.3 90.8 89.4 89.5 89.0  PLT 197 188 190 196 205   Cardiac Enzymes: No results for input(s): CKTOTAL, CKMB, CKMBINDEX, TROPONINI in the last 168 hours. BNP: Invalid input(s): POCBNP CBG: No results for input(s): GLUCAP in the last 168 hours. D-Dimer No results for input(s): DDIMER in the last 72 hours. Hgb A1c No results for input(s): HGBA1C in the last 72 hours. Lipid Profile No results for input(s): CHOL, HDL, LDLCALC, TRIG, CHOLHDL,  LDLDIRECT in the last 72 hours. Thyroid function studies No results for input(s): TSH, T4TOTAL, T3FREE, THYROIDAB in the last 72 hours.  Invalid input(s): FREET3 Anemia work up No results for input(s): VITAMINB12, FOLATE, FERRITIN, TIBC, IRON , RETICCTPCT in the last 72 hours. Urinalysis    Component Value Date/Time   COLORURINE YELLOW 03/24/2024 0726   APPEARANCEUR CLEAR 03/24/2024 0726   LABSPEC 1.043 (H) 03/24/2024 0726   PHURINE 7.0 03/24/2024 0726   GLUCOSEU NEGATIVE 03/24/2024 0726   HGBUR MODERATE (A) 03/24/2024 0726   BILIRUBINUR NEGATIVE 03/24/2024 0726   KETONESUR NEGATIVE 03/24/2024 0726   PROTEINUR NEGATIVE 03/24/2024 0726   UROBILINOGEN 0.2 11/22/2021 1440   NITRITE NEGATIVE 03/24/2024 0726   LEUKOCYTESUR NEGATIVE 03/24/2024 0726   Sepsis Labs Recent Labs  Lab 03/24/24 2132 03/25/24 0509 03/25/24 1739 03/26/24 0431  WBC 5.5 5.5 4.7 5.8   Microbiology No results found for this or any previous visit (from the past 240 hours).  FURTHER DISCHARGE INSTRUCTIONS:   Get Medicines reviewed and adjusted: Please take all your medications with you for your next visit with your Primary MD   Laboratory/radiological data: Please request your Primary MD to go over all hospital tests and  procedure/radiological results at the follow up, please ask your Primary MD to get all Hospital records sent to his/her office.   In some cases, they will be blood work, cultures and biopsy results pending at the time of your discharge. Please request that your primary care M.D. goes through all the records of your hospital data and follows up on these results.   Also Note the following: If you experience worsening of your admission symptoms, develop shortness of breath, life threatening emergency, suicidal or homicidal thoughts you must seek medical attention immediately by calling 911 or calling your MD immediately  if symptoms less severe.   You must read complete instructions/literature along with all the possible adverse reactions/side effects for all the Medicines you take and that have been prescribed to you. Take any new Medicines after you have completely understood and accpet all the possible adverse reactions/side effects.    patient was instructed, not to drive, operate heavy machinery, perform activities at heights, swimming or participation in water activities or provide baby-sitting services while on Pain, Sleep and Anxiety Medications; until their outpatient Physician has advised to do so again. Also recommended to not to take more than prescribed Pain, Sleep and Anxiety Medications.  It is not advisable to combine anxiety, sleep and pain medications without talking with your primary care provider.     Wear Seat belts while driving.   Please note: You were cared for by a hospitalist during your hospital stay. Once you are discharged, your primary care physician will handle any further medical issues. Please note that NO REFILLS for any discharge medications will be authorized once you are discharged, as it is imperative that you return to your primary care physician (or establish a relationship with a primary care physician if you do not have one) for your post hospital discharge needs  so that they can reassess your need for medications and monitor your lab values  Time coordinating discharge: Over 30 minutes  SIGNED:   Fredia Skeeter, MD  [facility] 03/26/2024, 7:40 AM *Please note that this is a verbal dictation therefore any spelling or grammatical errors are due to the Dragon Medical One system interpretation. If 7PM-7AM, please contact night-coverage [email or web address]     [1]  Allergies Allergen Reactions   Lactose Intolerance (Gi)  Other (See Comments)    Stomach irritation, and diarhea

## 2024-03-27 ENCOUNTER — Telehealth: Payer: Self-pay

## 2024-03-27 NOTE — Transitions of Care (Post Inpatient/ED Visit) (Unsigned)
" ° °  03/27/2024  Name: Alice Castillo MRN: [id number] DOB: [birth year]  Today's TOC FU Call Status: Today's TOC FU Call Status:: Unsuccessful Call (1st Attempt) Unsuccessful Call (1st Attempt) Date: 03/27/24  Attempted to reach the patient regarding the most recent Inpatient/ED visit.  Follow Up Plan: Additional outreach attempts will be made to reach the patient to complete the Transitions of Care (Post Inpatient/ED visit) call.   Signature Julian Lemmings, LPN [facility] Nurse Health Advisor Direct Dial [phone]  "

## 2024-03-30 NOTE — Transitions of Care (Post Inpatient/ED Visit) (Signed)
" ° °  03/30/2024  Name: Alice Castillo MRN: [id number] DOB: 03-26-1995  Today's TOC FU Call Status: Today's TOC FU Call Status:: Successful TOC FU Call Completed Unsuccessful Call (1st Attempt) Date: 03/27/24 [facility] FU Call Complete Date: 03/30/24  Patient's Name and Date of Birth confirmed. DOB  Transition Care Management Follow-up Telephone Call Date of Discharge: 03/26/24 [facility]charge Facility: [facility]) Type of Discharge: Inpatient Admission Primary Inpatient Discharge Diagnosis:: contusion How have you been since you were released from the hospital?: Better Any questions or concerns?: No  Items Reviewed: Did you receive and understand the discharge instructions provided?: Yes Medications obtained,verified, and reconciled?: Yes (Medications Reviewed) Any new allergies since your discharge?: No Dietary orders reviewed?: Yes Do you have support at home?: Yes People in Home [RPT]: friend(s)  Medications Reviewed Today: Medications Reviewed Today     Reviewed by Emmitt Pan, LPN (Licensed Practical Nurse) on 03/30/24 at 1415  Med List Status: <None>   Medication Order Taking? Sig Documenting Provider Last Dose Status Informant  iron  polysaccharides (NIFEREX) 150 MG capsule [id number] Yes Take 1 capsule (150 mg total) by mouth daily. Onesimo Emaline Brink, MD  Active   tranexamic acid  (LYSTEDA ) 650 MG TABS tablet [id number] Yes Take 2 tablets (1,300 mg total) by mouth 3 (three) times daily for 4 days, THEN 1 tablet (650 mg total) 3 (three) times daily for 14 days. Vernon Ranks, MD  Active   Med List Note Beverlee Reyes LELON Bishop 01/15/15 0155): Doesn't have a pharmacy she uses regularly             Home Care and Equipment/Supplies: Were Home Health Services Ordered?: NA Any new equipment or medical supplies ordered?: NA  Functional Questionnaire: Do you need assistance with bathing/showering or dressing?: No Do you need assistance with meal preparation?: No Do you need  assistance with eating?: No Do you have difficulty maintaining continence: No Do you need assistance with getting out of bed/getting out of a chair/moving?: No Do you have difficulty managing or taking your medications?: No  Follow up appointments reviewed: PCP Follow-up appointment confirmed?: No (sent message to staff to schedule) MD Provider Line [phone] Given: No Specialist Hospital Follow-up appointment confirmed?: NA Do you need transportation to your follow-up appointment?: No Do you understand care options if your condition(s) worsen?: Yes-patient verbalized understanding    SIGNATURE Pan Emmitt, LPN [facility] Nurse Health Advisor Direct Dial [phone]  "

## 2024-03-31 NOTE — Telephone Encounter (Signed)
 Called pt to schedule HFU appt - no answer; nor vm, unable to leave a message.

## 2024-04-07 ENCOUNTER — Inpatient Hospital Stay: Payer: Self-pay | Admitting: Student

## 2024-04-21 ENCOUNTER — Inpatient Hospital Stay: Payer: MEDICAID | Admitting: Hematology

## 2024-04-21 ENCOUNTER — Inpatient Hospital Stay: Payer: MEDICAID
